# Patient Record
Sex: Female | Born: 1995 | Race: Black or African American | Hispanic: No | Marital: Single | Smoking: Former smoker
Health system: Southern US, Community
[De-identification: ages and names within clinical notes are randomized; demographics above are authoritative.]

## PROBLEM LIST (undated history)

## (undated) DIAGNOSIS — B9689 Other specified bacterial agents as the cause of diseases classified elsewhere: Secondary | ICD-10-CM

## (undated) DIAGNOSIS — N949 Unspecified condition associated with female genital organs and menstrual cycle: Secondary | ICD-10-CM

## (undated) DIAGNOSIS — R1032 Left lower quadrant pain: Secondary | ICD-10-CM

## (undated) DIAGNOSIS — Z309 Encounter for contraceptive management, unspecified: Secondary | ICD-10-CM

## (undated) DIAGNOSIS — A549 Gonococcal infection, unspecified: Secondary | ICD-10-CM

## (undated) DIAGNOSIS — N898 Other specified noninflammatory disorders of vagina: Secondary | ICD-10-CM

## (undated) DIAGNOSIS — Z9109 Other allergy status, other than to drugs and biological substances: Secondary | ICD-10-CM

## (undated) DIAGNOSIS — N921 Excessive and frequent menstruation with irregular cycle: Secondary | ICD-10-CM

## (undated) DIAGNOSIS — N760 Acute vaginitis: Principal | ICD-10-CM

## (undated) DIAGNOSIS — O43199 Other malformation of placenta, unspecified trimester: Secondary | ICD-10-CM

## (undated) HISTORY — DX: Other specified bacterial agents as the cause of diseases classified elsewhere: B96.89

## (undated) HISTORY — DX: Unspecified condition associated with female genital organs and menstrual cycle: N94.9

## (undated) HISTORY — DX: Gonococcal infection, unspecified: A54.9

## (undated) HISTORY — PX: NO PAST SURGERIES: SHX2092

## (undated) HISTORY — DX: Other malformation of placenta, unspecified trimester: O43.199

## (undated) HISTORY — DX: Left lower quadrant pain: R10.32

## (undated) HISTORY — DX: Other specified noninflammatory disorders of vagina: N89.8

## (undated) HISTORY — DX: Encounter for contraceptive management, unspecified: Z30.9

## (undated) HISTORY — DX: Acute vaginitis: N76.0

## (undated) HISTORY — DX: Excessive and frequent menstruation with irregular cycle: N92.1

---
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
## 2016-02-10 ENCOUNTER — Ambulatory Visit: Payer: Self-pay | Admitting: Obstetrics and Gynecology

## 2016-05-09 ENCOUNTER — Encounter (HOSPITAL_COMMUNITY): Payer: Self-pay | Admitting: Emergency Medicine

## 2016-05-09 ENCOUNTER — Emergency Department (HOSPITAL_COMMUNITY)
Admission: EM | Admit: 2016-05-09 | Discharge: 2016-05-09 | Disposition: A | Discharge status: Home / Self Care | Payer: Medicaid Other | Attending: Emergency Medicine | Admitting: Emergency Medicine

## 2016-05-09 DIAGNOSIS — F17210 Nicotine dependence, cigarettes, uncomplicated: Secondary | ICD-10-CM | POA: Insufficient documentation

## 2016-05-09 DIAGNOSIS — J020 Streptococcal pharyngitis: Secondary | ICD-10-CM | POA: Insufficient documentation

## 2016-05-09 DIAGNOSIS — J45909 Unspecified asthma, uncomplicated: Secondary | ICD-10-CM | POA: Insufficient documentation

## 2016-05-09 LAB — RAPID STREP SCREEN (MED CTR MEBANE ONLY): Streptococcus, Group A Screen (Direct): POSITIVE — AB

## 2016-05-09 LAB — POC URINE PREG, ED: Preg Test, Ur: NEGATIVE

## 2016-05-09 MED ORDER — AZITHROMYCIN 250 MG PO TABS
ORAL_TABLET | ORAL | 0 refills | Status: DC
Start: 2016-05-09 — End: 2020-01-14

## 2016-05-09 MED ORDER — ACETAMINOPHEN 325 MG PO TABS
650.0000 mg | ORAL_TABLET | Freq: Once | ORAL | Status: AC
  Administered 2016-05-09: 650 mg via ORAL
  Filled 2016-05-09: qty 2

## 2016-05-09 NOTE — Discharge Instructions (Signed)
Take your entire course of antibiotics. Use tylenol or motrin for sore throat pain.

## 2016-05-09 NOTE — ED Triage Notes (Signed)
Pt reports sore throat x2 days. Pt denies any known fever.

## 2016-05-09 NOTE — ED Provider Notes (Signed)
[facility]PT Provider Note   CSN: [id number] Arrival date & time: 05/09/16  1520  By signing my name below, I, Cynda Acres, attest that this documentation has been prepared under the direction and in the presence of Burgess Amor, PA-C. Electronically Signed: Cynda Acres, Scribe. 05/09/16. 4:21 PM.  History   Chief Complaint Chief Complaint  Patient presents with  . Sore Throat    HPI Comments: Christina Conley is a 21 y.o. female with a history of asthma, who presents to the Emergency Department complaining of a persistent sore throat that began 3b days ago. Patient states her friend was diagnosed with strep throat a 4 days ago, in which she was given penicillin. Patient reports associated cough with sputum, subjective fever, nasal congestion, diaphoresis, and headache. No modifying factors indicated. Patient is a tobacco user.  Patient denies cp, sob, nausea, vomiting, diarrhea, dizziness, weakness, rhinorrhea, sinus or ear pain.  The history is provided by the patient. No language interpreter was used.    Past Medical History:  Diagnosis Date  . Asthma    child.  No meds > 2 years  . BV (bacterial vaginosis) 07/02/2012  . Contraceptive management 05/27/2015  . Environmental allergies   . Gonorrhea 08/2015  . Irregular intermenstrual bleeding 05/27/2015  . LLQ pain 05/27/2015  . Round ligament pain 12/09/2013  . Vaginal discharge 12/09/2013    Patient Active Problem List   Diagnosis Date Noted  . BV (bacterial vaginosis) 09/17/2015  . Irregular intermenstrual bleeding 05/27/2015  . LLQ pain 05/27/2015  . Contraceptive management 05/27/2015  . Active labor 07/10/2014  . Supervision of normal pregnancy in first trimester 12/12/2013  . Round ligament pain 12/09/2013  . Vaginal discharge 12/09/2013    Past Surgical History:  Procedure Laterality Date  . NO PAST SURGERIES      OB History    Gravida Para Term Preterm AB Living   2 1 1   1 1    SAB TAB Ectopic Multiple  Live Births   1     0 1       Home Medications    Prior to Admission medications   Medication Sig Start Date End Date Taking? Authorizing Provider  azithromycin (ZITHROMAX Z-PAK) 250 MG tablet Take 2 tablets by mouth on day one followed by one tablet daily for 4 days. 05/09/16   Burgess Amor, PA-C    Family History Family History  Problem Relation Age of Onset  . Hypertension Maternal Grandfather   . Hyperlipidemia Maternal Grandfather   . Crohn's disease Mother   . Other Father     problems with intestines  . Crohn's disease Paternal Grandmother   . Hypertension Maternal Grandmother   . Miscarriages / Stillbirths Maternal Grandmother     Social History Social History  Substance Use Topics  . Smoking status: Current Some Day Smoker    Packs/day: 0.25    Years: 0.50    Types: Cigarettes  . Smokeless tobacco: Never Used  . Alcohol use No     Allergies   Amoxicillin; Ampicillin; and Penicillins   Review of Systems Review of Systems  Constitutional: Positive for diaphoresis and fever (subjective). Negative for chills.  HENT: Positive for congestion and sore throat. Negative for ear pain and rhinorrhea.   Gastrointestinal: Negative for nausea and vomiting.  Neurological: Positive for headaches.     Physical Exam Updated Vital Signs BP 122/81 (BP Location: Right Arm)   Pulse 81   Temp 99.4 F (37.4 C) (Oral)  Resp 18   Ht 5\' 11"  (1.803 m)   Wt 68.9 kg   LMP 04/13/2016   SpO2 100%   BMI 21.20 kg/m   Physical Exam  Constitutional: She is oriented to person, place, and time. She appears well-developed.  HENT:  Head: Normocephalic and atraumatic.  Mouth/Throat: Oropharynx is clear and moist. No oropharyngeal exudate.  Generalized posterior pharyngeal erythema. Scattered petechiae on the soft palate and uvula. No significant edema. Tenderness at the bilateral tonsillar nodes without any significant edema.   Eyes: Conjunctivae and EOM are normal. Pupils are  equal, round, and reactive to light.  Neck: Normal range of motion. Neck supple.  Cardiovascular: Normal rate, regular rhythm and normal heart sounds.   Pulmonary/Chest: Effort normal and breath sounds normal. She has no wheezes. She has no rales.  Musculoskeletal: Normal range of motion.  Neurological: She is alert and oriented to person, place, and time.  Skin: Skin is warm and dry.  Psychiatric: She has a normal mood and affect.  Nursing note and vitals reviewed.    ED Treatments / Results  DIAGNOSTIC STUDIES: Oxygen Saturation is 99% on RA, normal by my interpretation.    COORDINATION OF CARE: 4:20 PM Discussed treatment plan with pt at bedside and pt agreed to plan, which includes antibiotics.   Labs (all labs ordered are listed, but only abnormal results are displayed) Labs Reviewed  RAPID STREP SCREEN (NOT AT [facility]) - Abnormal; Notable for the following:       Result Value   Streptococcus, Group A Screen (Direct) POSITIVE (*)    All other components within normal limits  POC URINE PREG, ED    EKG  EKG Interpretation None       Radiology No results found.  Procedures Procedures (including critical care time)  Medications Ordered in ED Medications  acetaminophen (TYLENOL) tablet 650 mg (650 mg Oral Given 05/09/16 1648)     Initial Impression / Assessment and Plan / ED Course  I have reviewed the triage vital signs and the nursing notes.  Pertinent labs & imaging results that were available during my care of the patient were reviewed by me and considered in my medical decision making (see chart for details).     Strep positive. z pack given amoxil allergy. Rest, fluids, tylenol/motrin, return precautions discussed.  Final Clinical Impressions(s) / ED Diagnoses   Final diagnoses:  Strep throat    New Prescriptions Discharge Medication List as of 05/09/2016  5:17 PM     I personally performed the services described in this documentation, which was  scribed in my presence. The recorded information has been reviewed and is accurate.     Burgess AmorJulie Sabiha Sura, PA-C 05/10/16 1303    Maia PlanJoshua G Long, MD 05/10/16 [phone]

## 2016-05-20 ENCOUNTER — Emergency Department (HOSPITAL_COMMUNITY)
Admission: EM | Admit: 2016-05-20 | Discharge: 2016-05-21 | Disposition: A | Discharge status: Home / Self Care | Payer: Medicaid Other | Attending: Emergency Medicine | Admitting: Emergency Medicine

## 2016-05-20 ENCOUNTER — Encounter (HOSPITAL_COMMUNITY): Payer: Self-pay

## 2016-05-20 DIAGNOSIS — F17210 Nicotine dependence, cigarettes, uncomplicated: Secondary | ICD-10-CM | POA: Insufficient documentation

## 2016-05-20 DIAGNOSIS — J45909 Unspecified asthma, uncomplicated: Secondary | ICD-10-CM | POA: Insufficient documentation

## 2016-05-20 DIAGNOSIS — R110 Nausea: Secondary | ICD-10-CM | POA: Insufficient documentation

## 2016-05-20 DIAGNOSIS — R102 Pelvic and perineal pain: Secondary | ICD-10-CM | POA: Insufficient documentation

## 2016-05-20 NOTE — ED Triage Notes (Signed)
Patient states that she is having lower abdominal pain, cramping, and pink tinge vaginal discharge.

## 2016-05-21 LAB — URINALYSIS, ROUTINE W REFLEX MICROSCOPIC
Bacteria, UA: NONE SEEN
Bilirubin Urine: NEGATIVE
Glucose, UA: NEGATIVE mg/dL
Hgb urine dipstick: NEGATIVE
Ketones, ur: 5 mg/dL — AB
Nitrite: NEGATIVE
PH: 5 (ref 5.0–8.0)
Protein, ur: NEGATIVE mg/dL
SPECIFIC GRAVITY, URINE: 1.025 (ref 1.005–1.030)

## 2016-05-21 LAB — CBC
HCT: 35.9 % — ABNORMAL LOW (ref 36.0–46.0)
Hemoglobin: 12.2 g/dL (ref 12.0–15.0)
MCH: 29.6 pg (ref 26.0–34.0)
MCHC: 34 g/dL (ref 30.0–36.0)
MCV: 87.1 fL (ref 78.0–100.0)
PLATELETS: 309 10*3/uL (ref 150–400)
RBC: 4.12 MIL/uL (ref 3.87–5.11)
RDW: 14 % (ref 11.5–15.5)
WBC: 8.3 10*3/uL (ref 4.0–10.5)

## 2016-05-21 LAB — COMPREHENSIVE METABOLIC PANEL
ALT: 16 U/L (ref 14–54)
ANION GAP: 7 (ref 5–15)
AST: 20 U/L (ref 15–41)
Albumin: 4.2 g/dL (ref 3.5–5.0)
Alkaline Phosphatase: 51 U/L (ref 38–126)
BUN: 11 mg/dL (ref 6–20)
CALCIUM: 9.2 mg/dL (ref 8.9–10.3)
CHLORIDE: 103 mmol/L (ref 101–111)
CO2: 27 mmol/L (ref 22–32)
Creatinine, Ser: 0.78 mg/dL (ref 0.44–1.00)
Glucose, Bld: 97 mg/dL (ref 65–99)
Potassium: 3.5 mmol/L (ref 3.5–5.1)
SODIUM: 137 mmol/L (ref 135–145)
Total Bilirubin: 0.6 mg/dL (ref 0.3–1.2)
Total Protein: 7.6 g/dL (ref 6.5–8.1)

## 2016-05-21 LAB — WET PREP, GENITAL
CLUE CELLS WET PREP: NONE SEEN
SPERM: NONE SEEN
TRICH WET PREP: NONE SEEN
YEAST WET PREP: NONE SEEN

## 2016-05-21 LAB — LIPASE, BLOOD: LIPASE: 13 U/L (ref 11–51)

## 2016-05-21 LAB — POC URINE PREG, ED: Preg Test, Ur: NEGATIVE

## 2016-05-21 MED ORDER — KETOROLAC TROMETHAMINE 30 MG/ML IJ SOLN
30.0000 mg | Freq: Once | INTRAMUSCULAR | Status: DC
  Filled 2016-05-21: qty 1

## 2016-05-21 NOTE — ED Notes (Signed)
Pt not in room when discharge paperwork brought to room. Per another nurse pt left before papers were ready.

## 2016-05-21 NOTE — ED Provider Notes (Signed)
[facility] Provider Note   CSN: [id number] Arrival date & time: 05/20/16  2218  Time seen 12:55 AM   History   Chief Complaint Chief Complaint  Patient presents with  . Abdominal Pain    HPI Christina Conley is a 21 y.o. female.  HPI  patient states on March 29 about 4 PM in the afternoon she started having lower abdominal cramping all the way across her lower abdomen. She states it's a burning sensation. She states it comes and goes but got worse today. She's had nausea without vomiting or diarrhea. She denies dysuria, frequency, hematuria, or vaginal discharge. She denies fevers. She states after she urinates if she wipes she sometimes seems a faint pink on the toilet paper. She states laying on her side makes it hurt more, also relaxing makes it hurt more. Nothing makes it feel better however she has tried no medications. She states she had similar pain with cyst in the past.  Patient states her last normal period started March 22. She is G2 P1 Ab1, she is sexually active and uses condoms.  GYN Dr Emelda Fear  Past Medical History:  Diagnosis Date  . Asthma    child.  No meds > 2 years  . BV (bacterial vaginosis) 07/02/2012  . Contraceptive management 05/27/2015  . Environmental allergies   . Gonorrhea 08/2015  . Irregular intermenstrual bleeding 05/27/2015  . LLQ pain 05/27/2015  . Round ligament pain 12/09/2013  . Vaginal discharge 12/09/2013    Patient Active Problem List   Diagnosis Date Noted  . BV (bacterial vaginosis) 09/17/2015  . Irregular intermenstrual bleeding 05/27/2015  . LLQ pain 05/27/2015  . Contraceptive management 05/27/2015  . Active labor 07/10/2014  . Supervision of normal pregnancy in first trimester 12/12/2013  . Round ligament pain 12/09/2013  . Vaginal discharge 12/09/2013    Past Surgical History:  Procedure Laterality Date  . NO PAST SURGERIES      OB History    Gravida Para Term Preterm AB Living   SAB TAB Ectopic  Multiple Live Births   1     0 1       Home Medications    Prior to Admission medications   Medication Sig Start Date End Date Taking? Authorizing Provider  azithromycin (ZITHROMAX Z-PAK) 250 MG tablet Take 2 tablets by mouth on day one followed by one tablet daily for 4 days. 05/09/16   Burgess Amor, PA-C    Family History Family History  Problem Relation Age of Onset  . Hypertension Maternal Grandfather   . Hyperlipidemia Maternal Grandfather   . Crohn's disease Mother   . Other Father     problems with intestines  . Crohn's disease Paternal Grandmother   . Hypertension Maternal Grandmother   . Miscarriages / Stillbirths Maternal Grandmother     Social History Social History  Substance Use Topics  . Smoking status: Current Some Day Smoker    Packs/day: 0.25    Years: 0.50    Types: Cigarettes  . Smokeless tobacco: Never Used  . Alcohol use No  unemployed   Allergies   Amoxicillin; Ampicillin; and Penicillins   Review of Systems Review of Systems  All other systems reviewed and are negative.    Physical Exam Updated Vital Signs BP 103/78 (BP Location: Right Arm)   Pulse 87   Temp 97.8 F (36.6 C) (Oral)   Resp 20   Ht  (1.803 m)  Wt 158 lb (71.7 kg)   LMP 05/12/2016 (Exact Date)   SpO2 100%   BMI 22.04 kg/m   Vital signs normal    Physical Exam  Constitutional: She is oriented to person, place, and time. She appears well-developed and well-nourished.  Non-toxic appearance. She does not appear ill. No distress.  HENT:  Head: Normocephalic and atraumatic.  Right Ear: External ear normal.  Left Ear: External ear normal.  Nose: Nose normal. No mucosal edema or rhinorrhea.  Mouth/Throat: Oropharynx is clear and moist and mucous membranes are normal. No dental abscesses or uvula swelling.  Eyes: Conjunctivae and EOM are normal. Pupils are equal, round, and reactive to light.  Neck: Normal range of motion and full passive range of motion without  pain. Neck supple.  Cardiovascular: Normal rate, regular rhythm and normal heart sounds.  Exam reveals no gallop and no friction rub.   No murmur heard. Pulmonary/Chest: Effort normal and breath sounds normal. No respiratory distress. She has no wheezes. She has no rhonchi. She has no rales. She exhibits no tenderness and no crepitus.  Abdominal: Soft. Normal appearance and bowel sounds are normal. She exhibits no distension. There is no tenderness. There is no rebound and no guarding.  Genitourinary:  Genitourinary Comments: Normal external genitalia, uterus is normal size and nontender, she is nontender to palpation over either ovary. She is noted to have a moderate amount of thin blood-tinged vaginal discharge.  Musculoskeletal: Normal range of motion. She exhibits no edema or tenderness.  Moves all extremities well.   Neurological: She is alert and oriented to person, place, and time. She has normal strength. No cranial nerve deficit.  Skin: Skin is warm, dry and intact. No rash noted. No erythema. No pallor.  Psychiatric: She has a normal mood and affect. Her speech is normal and behavior is normal. Her mood appears not anxious.  Nursing note and vitals reviewed.    ED Treatments / Results  Labs (all labs ordered are listed, but only abnormal results are displayed) Results for orders placed or performed during the hospital encounter of 05/20/16  Wet prep, genital  Result Value Ref Range   Yeast Wet Prep HPF POC NONE SEEN NONE SEEN   Trich, Wet Prep NONE SEEN NONE SEEN   Clue Cells Wet Prep HPF POC NONE SEEN NONE SEEN   WBC, Wet Prep HPF POC MANY (A) NONE SEEN   Sperm NONE SEEN   Lipase, blood  Result Value Ref Range   Lipase 13 11 - 51 U/L  Comprehensive metabolic panel  Result Value Ref Range   Sodium 137 135 - 145 mmol/L   Potassium 3.5 3.5 - 5.1 mmol/L   Chloride 103 101 - 111 mmol/L   CO2 27 22 - 32 mmol/L   Glucose, Bld 97 65 - 99 mg/dL   BUN 11 6 - 20 mg/dL    Creatinine, Ser 2.13 0.44 - 1.00 mg/dL   Calcium 9.2 8.9 - 08.6 mg/dL   Total Protein 7.6 6.5 - 8.1 g/dL   Albumin 4.2 3.5 - 5.0 g/dL   AST 20 15 - 41 U/L   ALT 16 14 - 54 U/L   Alkaline Phosphatase 51 38 - 126 U/L   Total Bilirubin 0.6 0.3 - 1.2 mg/dL   GFR calc non Af Amer >60 >60 mL/min   GFR calc Af Amer >60 >60 mL/min   Anion gap 7 5 - 15  CBC  Result Value Ref Range   WBC 8.3 4.0 -  10.5 K/uL   RBC 4.12 3.87 - 5.11 MIL/uL   Hemoglobin 12.2 12.0 - 15.0 g/dL   HCT 16.1 (L) 09.6 - 04.5 %   MCV 87.1 78.0 - 100.0 fL   MCH 29.6 26.0 - 34.0 pg   MCHC 34.0 30.0 - 36.0 g/dL   RDW 40.9 81.1 - 91.4 %   Platelets 309 150 - 400 K/uL  Urinalysis, Routine w reflex microscopic  Result Value Ref Range   Color, Urine YELLOW YELLOW   APPearance HAZY (A) CLEAR   Specific Gravity, Urine 1.025 1.005 - 1.030   pH 5.0 5.0 - 8.0   Glucose, UA NEGATIVE NEGATIVE mg/dL   Hgb urine dipstick NEGATIVE NEGATIVE   Bilirubin Urine NEGATIVE NEGATIVE   Ketones, ur 5 (A) NEGATIVE mg/dL   Protein, ur NEGATIVE NEGATIVE mg/dL   Nitrite NEGATIVE NEGATIVE   Leukocytes, UA LARGE (A) NEGATIVE   RBC / HPF 6-30 0 - 5 RBC/hpf   WBC, UA 6-30 0 - 5 WBC/hpf   Bacteria, UA NONE SEEN NONE SEEN   Squamous Epithelial / LPF 6-30 (A) NONE SEEN   Mucous PRESENT   POC urine preg, ED  Result Value Ref Range   Preg Test, Ur NEGATIVE NEGATIVE   Laboratory interpretation all normal except contaminated UA    EKG  EKG Interpretation None       Radiology No results found.  Procedures Procedures (including critical care time)  Medications Ordered in ED Medications  ketorolac (TORADOL) 30 MG/ML injection 30 mg (30 mg Intravenous Refused 05/21/16 0113)     Initial Impression / Assessment and Plan / ED Course  I have reviewed the triage vital signs and the nursing notes.  Pertinent labs & imaging results that were available during my care of the patient were reviewed by me and considered in my medical decision  making (see chart for details).  Patient had an IV inserted by nursing staff. However she refused to get any thing for pain including IV Toradol. She states she prefers not to take any medication.  Patient reports she has a history of BV. However her wet prep tonight is normal. She had no pain to palpation when I examined her abdomen and she had no pain to palpation when I did her pelvic exam. She can take ibuprofen and/or acetaminophen for pain. She is advised [facility]. She will be called if her STD tests are positive. She should return to the ED if her symptoms seem worse.  Final Clinical Impressions(s) / ED Diagnoses   Final diagnoses:  Pelvic pain    New Prescriptions OTC ibuprofen/acetaminophen  Plan discharge  Devoria Albe, MD, Concha Pyo, MD 05/21/16 [phone]

## 2016-05-21 NOTE — ED Notes (Signed)
This nurse was at nurse's station and saw patient walk out of room one, down hallway toward's lobby. Ron, RN, charge nurse made aware.

## 2016-05-21 NOTE — Discharge Instructions (Signed)
The tests that resulted tonight do not give a reason for your pain. You can take ibuprofen 600 mg + acetaminophen 650 mg 4 times a day for pain. You will be called if your STD tests are positive. Call [facility] on Monday, April 2, if you aren't improving. Return to the ED if you get worse, such as fever, vomiting, worsening pain.

## 2016-05-23 LAB — GC/CHLAMYDIA PROBE AMP (~~LOC~~) NOT AT ARMC
CHLAMYDIA, DNA PROBE: NEGATIVE
NEISSERIA GONORRHEA: NEGATIVE

## 2016-06-27 ENCOUNTER — Encounter (HOSPITAL_COMMUNITY): Payer: Self-pay | Admitting: Emergency Medicine

## 2016-06-27 ENCOUNTER — Emergency Department (HOSPITAL_COMMUNITY)
Admission: EM | Admit: 2016-06-27 | Discharge: 2016-06-27 | Disposition: A | Discharge status: Home / Self Care | Payer: No Typology Code available for payment source | Attending: Emergency Medicine | Admitting: Emergency Medicine

## 2016-06-27 DIAGNOSIS — J45909 Unspecified asthma, uncomplicated: Secondary | ICD-10-CM | POA: Insufficient documentation

## 2016-06-27 DIAGNOSIS — M542 Cervicalgia: Secondary | ICD-10-CM | POA: Insufficient documentation

## 2016-06-27 DIAGNOSIS — Y9389 Activity, other specified: Secondary | ICD-10-CM | POA: Diagnosis not present

## 2016-06-27 DIAGNOSIS — R51 Headache: Secondary | ICD-10-CM | POA: Insufficient documentation

## 2016-06-27 DIAGNOSIS — Y92410 Unspecified street and highway as the place of occurrence of the external cause: Secondary | ICD-10-CM | POA: Insufficient documentation

## 2016-06-27 DIAGNOSIS — M7918 Myalgia, other site: Secondary | ICD-10-CM

## 2016-06-27 DIAGNOSIS — F17210 Nicotine dependence, cigarettes, uncomplicated: Secondary | ICD-10-CM | POA: Diagnosis not present

## 2016-06-27 DIAGNOSIS — Y999 Unspecified external cause status: Secondary | ICD-10-CM | POA: Diagnosis not present

## 2016-06-27 DIAGNOSIS — S0990XA Unspecified injury of head, initial encounter: Secondary | ICD-10-CM | POA: Diagnosis present

## 2016-06-27 LAB — POC URINE PREG, ED: Preg Test, Ur: NEGATIVE

## 2016-06-27 MED ORDER — ACETAMINOPHEN 500 MG PO TABS
1000.0000 mg | ORAL_TABLET | Freq: Once | ORAL | Status: AC
  Administered 2016-06-27: 1000 mg via ORAL
  Filled 2016-06-27: qty 2

## 2016-06-27 MED ORDER — IBUPROFEN 800 MG PO TABS: 800.0000 mg | ORAL_TABLET | Freq: Three times a day (TID) | ORAL | 0 refills | Status: DC
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
## 2020-01-14 ENCOUNTER — Ambulatory Visit (INDEPENDENT_AMBULATORY_CARE_PROVIDER_SITE_OTHER): Payer: Medicaid Other | Admitting: Women's Health

## 2020-01-14 ENCOUNTER — Encounter: Payer: Self-pay | Admitting: Women's Health

## 2020-01-14 ENCOUNTER — Other Ambulatory Visit: Payer: Self-pay

## 2020-01-14 VITALS — BP 112/70 | HR 72 | Ht 71.0 in | Wt 201.2 lb

## 2020-01-14 DIAGNOSIS — Z3491 Encounter for supervision of normal pregnancy, unspecified, first trimester: Secondary | ICD-10-CM

## 2020-01-14 DIAGNOSIS — Z3201 Encounter for pregnancy test, result positive: Secondary | ICD-10-CM | POA: Diagnosis not present

## 2020-01-14 LAB — POCT URINE PREGNANCY: Preg Test, Ur: POSITIVE — AB

## 2020-01-14 NOTE — Patient Instructions (Signed)
Christina Conley, I greatly value your feedback.  If you receive a survey following your visit with [place] today, we appreciate you taking the time to fill it out.  Thanks, [name], CNM, WHNP-BC   [facility] ([address] [place], [place] [address]) Entrance C, located off of [address] Free 24/7 valet parking   Nausea & Vomiting  Have saltine crackers or pretzels by your bed and eat a few bites before you raise your head out of bed in the morning  Eat small frequent meals throughout the day instead of large meals  Drink plenty of fluids throughout the day to stay hydrated, just don't drink a lot of fluids with your meals.  This can make your stomach fill up faster making you feel sick  Do not brush your teeth right after you eat  Products with real ginger are good for nausea, like ginger ale and ginger hard candy Make sure it says made with real ginger!  Sucking on sour candy like lemon heads is also good for nausea  If your prenatal vitamins make you nauseated, take them at night so you will sleep through the nausea  Sea Bands  If you feel like you need medicine for the nausea & vomiting please let us know  If you are unable to keep any fluids or food down please let us know   Constipation  Drink plenty of fluid, preferably water, throughout the day  Eat foods high in fiber such as fruits, vegetables, and grains  Exercise, such as walking, is a good way to keep your bowels regular  Drink warm fluids, especially warm prune juice, or decaf coffee  Eat a 1/2 cup of real oatmeal (not instant), 1/2 cup applesauce, and 1/2-1 cup warm prune juice every day  If needed, you may take Colace (docusate sodium) stool softener once or twice a day to help keep the stool soft.   If you still are having problems with constipation, you may take Miralax once daily as needed to help keep your bowels regular.   Home Blood Pressure Monitoring for Patients    Your provider has recommended that you check your blood pressure (BP) at least once a week at home. If you do not have a blood pressure cuff at home, one will be provided for you. Contact your provider if you have not received your monitor within 1 week.   Helpful Tips for Accurate Home Blood Pressure Checks  . Don't smoke, exercise, or drink caffeine 30 minutes before checking your BP . Use the restroom before checking your BP (a full bladder can raise your pressure) . Relax in a comfortable upright chair . Feet on the ground . Left arm resting comfortably on a flat surface at the level of your heart . Legs uncrossed . Back supported . Sit quietly and don't talk . Place the cuff on your bare arm . Adjust snuggly, so that only two fingertips can fit between your skin and the top of the cuff . Check 2 readings separated by at least one minute . Keep a log of your BP readings . For a visual, please reference this diagram: http://ccnc.care/bpdiagram  Provider Name: [facility]     Phone: [phone]  Zone 1: ALL CLEAR  Continue to monitor your symptoms:  . BP reading is less than 140 (top number) or less than 90 (bottom number)  . No right upper stomach pain . No headaches or  seeing spots . No feeling nauseated or throwing up . No swelling in face and hands  Zone 2: CAUTION Call your doctor's office for any of the following:  . BP reading is greater than 140 (top number) or greater than 90 (bottom number)  . Stomach pain under your ribs in the middle or right side . Headaches or seeing spots . Feeling nauseated or throwing up . Swelling in face and hands  Zone 3: EMERGENCY  Seek immediate medical care if you have any of the following:  . BP reading is greater than160 (top number) or greater than 110 (bottom number) . Severe headaches not improving with Tylenol . Serious difficulty catching your breath . Any worsening symptoms from Zone 2    First Trimester of  Pregnancy The first trimester of pregnancy is from week 1 until the end of week 12 (months 1 through 3). A week after a sperm fertilizes an egg, the egg will implant on the wall of the uterus. This embryo will begin to develop into a baby. Genes from you and your partner are forming the baby. The female genes determine whether the baby is a boy or a girl. At 6-8 weeks, the eyes and face are formed, and the heartbeat can be seen on ultrasound. At the end of 12 weeks, all the baby's organs are formed.  Now that you are pregnant, you will want to do everything you can to have a healthy baby. Two of the most important things are to get good prenatal care and to follow your health care provider's instructions. Prenatal care is all the medical care you receive before the baby's birth. This care will help prevent, find, and treat any problems during the pregnancy and childbirth. BODY CHANGES Your body goes through many changes during pregnancy. The changes vary from woman to woman.   You may gain or lose a couple of pounds at first.  You may feel sick to your stomach (nauseous) and throw up (vomit). If the vomiting is uncontrollable, call your health care provider.  You may tire easily.  You may develop headaches that can be relieved by medicines approved by your health care provider.  You may urinate more often. Painful urination may mean you have a bladder infection.  You may develop heartburn as a result of your pregnancy.  You may develop constipation because certain hormones are causing the muscles that push waste through your intestines to slow down.  You may develop hemorrhoids or swollen, bulging veins (varicose veins).  Your breasts may begin to grow larger and become tender. Your nipples may stick out more, and the tissue that surrounds them (areola) may become darker.  Your gums may bleed and may be sensitive to brushing and flossing.  Dark spots or blotches (chloasma, mask of pregnancy)  may develop on your face. This will likely fade after the baby is born.  Your menstrual periods will stop.  You may have a loss of appetite.  You may develop cravings for certain kinds of food.  You may have changes in your emotions from day to day, such as being excited to be pregnant or being concerned that something may go wrong with the pregnancy and baby.  You may have more vivid and strange dreams.  You may have changes in your hair. These can include thickening of your hair, rapid growth, and changes in texture. Some women also have hair loss during or after pregnancy, or hair that feels dry or thin. Your  hair will most likely return to normal after your baby is born. WHAT TO EXPECT AT YOUR PRENATAL VISITS During a routine prenatal visit:  You will be weighed to make sure you and the baby are growing normally.  Your blood pressure will be taken.  Your abdomen will be measured to track your baby's growth.  The fetal heartbeat will be listened to starting around week 10 or 12 of your pregnancy.  Test results from any previous visits will be discussed. Your health care provider may ask you:  How you are feeling.  If you are feeling the baby move.  If you have had any abnormal symptoms, such as leaking fluid, bleeding, severe headaches, or abdominal cramping.  If you have any questions. Other tests that may be performed during your first trimester include:  Blood tests to find your blood type and to check for the presence of any previous infections. They will also be used to check for low iron levels (anemia) and Rh antibodies. Later in the pregnancy, blood tests for diabetes will be done along with other tests if problems develop.  Urine tests to check for infections, diabetes, or protein in the urine.  An ultrasound to confirm the proper growth and development of the baby.  An amniocentesis to check for possible genetic problems.  Fetal screens for spina bifida and  Down syndrome.  You may need other tests to make sure you and the baby are doing well. HOME CARE INSTRUCTIONS  Medicines  Follow your health care provider's instructions regarding medicine use. Specific medicines may be either safe or unsafe to take during pregnancy.  Take your prenatal vitamins as directed.  If you develop constipation, try taking a stool softener if your health care provider approves. Diet  Eat regular, well-balanced meals. Choose a variety of foods, such as meat or vegetable-based protein, fish, milk and low-fat dairy products, vegetables, fruits, and whole grain breads and cereals. Your health care provider will help you determine the amount of weight gain that is right for you.  Avoid raw meat and uncooked cheese. These carry germs that can cause birth defects in the baby.  Eating four or five small meals rather than three large meals a day may help relieve nausea and vomiting. If you start to feel nauseous, eating a few soda crackers can be helpful. Drinking liquids between meals instead of during meals also seems to help nausea and vomiting.  If you develop constipation, eat more high-fiber foods, such as fresh vegetables or fruit and whole grains. Drink enough fluids to keep your urine clear or pale yellow. Activity and Exercise  Exercise only as directed by your health care provider. Exercising will help you:  Control your weight.  Stay in shape.  Be prepared for labor and delivery.  Experiencing pain or cramping in the lower abdomen or low back is a good sign that you should stop exercising. Check with your health care provider before continuing normal exercises.  Try to avoid standing for long periods of time. Move your legs often if you must stand in one place for a long time.  Avoid heavy lifting.  Wear low-heeled shoes, and practice good posture.  You may continue to have sex unless your health care provider directs you otherwise. Relief of Pain  or Discomfort  Wear a good support bra for breast tenderness.    Take warm sitz baths to soothe any pain or discomfort caused by hemorrhoids. Use hemorrhoid cream if your health care provider  approves.    Rest with your legs elevated if you have leg cramps or low back pain.  If you develop varicose veins in your legs, wear support hose. Elevate your feet for 15 minutes, 3-4 times a day. Limit salt in your diet. Prenatal Care  Schedule your prenatal visits by the twelfth week of pregnancy. They are usually scheduled monthly at first, then more often in the last 2 months before delivery.  Write down your questions. Take them to your prenatal visits.  Keep all your prenatal visits as directed by your health care provider. Safety  Wear your seat belt at all times when driving.  Make a list of emergency phone numbers, including numbers for family, friends, the hospital, and police and fire departments. General Tips  Ask your health care provider for a referral to a local prenatal education class. Begin classes no later than at the beginning of month 6 of your pregnancy.  Ask for help if you have counseling or nutritional needs during pregnancy. Your health care provider can offer advice or refer you to specialists for help with various needs.  Do not use hot tubs, steam rooms, or saunas.  Do not douche or use tampons or scented sanitary pads.  Do not cross your legs for long periods of time.  Avoid cat litter boxes and soil used by cats. These carry germs that can cause birth defects in the baby and possibly loss of the fetus by miscarriage or stillbirth.  Avoid all smoking, herbs, alcohol, and medicines not prescribed by your health care provider. Chemicals in these affect the formation and growth of the baby.  Schedule a dentist appointment. At home, brush your teeth with a soft toothbrush and be gentle when you floss. SEEK MEDICAL CARE IF:   You have dizziness.  You have mild  pelvic cramps, pelvic pressure, or nagging pain in the abdominal area.  You have persistent nausea, vomiting, or diarrhea.  You have a bad smelling vaginal discharge.  You have pain with urination.  You notice increased swelling in your face, hands, legs, or ankles. SEEK IMMEDIATE MEDICAL CARE IF:   You have a fever.  You are leaking fluid from your vagina.  You have spotting or bleeding from your vagina.  You have severe abdominal cramping or pain.  You have rapid weight gain or loss.  You vomit blood or material that looks like coffee grounds.  You are exposed to [place] measles and have never had them.  You are exposed to fifth disease or chickenpox.  You develop a severe headache.  You have shortness of breath.  You have any kind of trauma, such as from a fall or a car accident. Document Released: 02/01/2001 Document Revised: 06/24/2013 Document Reviewed: 12/18/2012 [facility] Patient Information 2015 [place], [place]. This information is not intended to replace advice given to you by your health care provider. Make sure you discuss any questions you have with your health care provider.

## 2020-01-14 NOTE — Progress Notes (Signed)
   GYN VISIT Patient name: Christina Conley MRN [id number]  Date of birth: [birth year] Chief Complaint:   Possible Pregnancy (3+ UPT @ home; + nausea)  History of Present Illness:   Christina Conley is a 24 y.o. G9P1011 African American female at [redacted]w[redacted]d by LMP 10/17 being seen today for +HPT x 3. Nausea, no vomiting. PNV actually help w/ nausea.     Depression screen [facility] 2/9 01/14/2020 10/15/2012 10/15/2012  Decreased Interest 3 0 0  Down, Depressed, Hopeless 0 1 1  PHQ - 2 Score 3 1 1   Altered sleeping 3 2 2   Tired, decreased energy 3 1 -  Change in appetite 3 2 -  Feeling bad or failure about yourself  1 1 -  Trouble concentrating 0 0 -  Moving slowly or fidgety/restless 0 0 -  Suicidal thoughts 0 0 -  PHQ-9 Score 13 7 3     Patient's last menstrual period was 12/08/2019. The current method of family planning is none.  Review of Systems:   Pertinent items are noted in HPI Denies fever/chills, dizziness, headaches, visual disturbances, fatigue, shortness of breath, chest pain, abdominal pain, vomiting, abnormal vaginal discharge/itching/odor/irritation, problems with periods, bowel movements, urination, or intercourse unless otherwise stated above.  Pertinent History Reviewed:  Reviewed past medical,surgical, social, obstetrical and family history.  Reviewed problem list, medications and allergies. Physical Assessment:   Vitals:   01/14/20 0952  BP: 112/70  Pulse: 72  Weight: 201 lb 3.2 oz (91.3 kg)  Height: 5\' 11"  (1.803 m)  Body mass index is 28.06 kg/m.       Physical Examination:   General appearance: alert, well appearing, and in no distress  Mental status: alert, oriented to person, place, and time  Skin: warm & dry   Cardiovascular: normal heart rate noted  Respiratory: normal respiratory effort, no distress  Abdomen: soft, non-tender   Pelvic: examination not indicated  Extremities: no edema   Chaperone: N/A    Results for orders placed or performed in visit on  01/14/20 (from the past 24 hour(s))  POCT urine pregnancy   Collection Time: 01/14/20  9:51 AM  Result Value Ref Range   Preg Test, Ur Positive (A) Negative    Assessment & Plan:  1) [redacted]w[redacted]d by LMP> will get dating u/s in 2wks  2) Nausea> declines meds today, let know if changes mind  Meds: No orders of the defined types were placed in this encounter.   Orders Placed This Encounter  Procedures  . 01/16/20 OB Comp Less 14 Wks  . POCT urine pregnancy    Return in about 2 weeks (around 01/28/2020) for dating u/s.  [redacted]w[redacted]d CNM, [facility] 01/14/2020 10:17 AM

## 2020-01-28 ENCOUNTER — Other Ambulatory Visit: Payer: Self-pay | Admitting: Women's Health

## 2020-01-28 ENCOUNTER — Other Ambulatory Visit: Payer: Self-pay

## 2020-01-28 ENCOUNTER — Ambulatory Visit (INDEPENDENT_AMBULATORY_CARE_PROVIDER_SITE_OTHER): Payer: Medicaid Other

## 2020-01-28 DIAGNOSIS — Z3491 Encounter for supervision of normal pregnancy, unspecified, first trimester: Secondary | ICD-10-CM | POA: Diagnosis not present

## 2020-01-28 DIAGNOSIS — O30031 Twin pregnancy, monochorionic/diamniotic, first trimester: Secondary | ICD-10-CM

## 2020-01-28 NOTE — Progress Notes (Signed)
[place] MO/DI twin IUP,normal ovaries,single anterior placenta,one chorion,two amnions BABY A: CRL 17.08 mm,fhr 160 bpm,YS BABY B:CRL 17.48 mm,fhr 158 bpm,YS

## 2020-01-30 ENCOUNTER — Telehealth: Payer: Self-pay | Admitting: Obstetrics & Gynecology

## 2020-01-30 NOTE — Telephone Encounter (Signed)
Called patient back. States that she has a history of migraines. She has been having headaches every day lately and dizzy when standing. She has not tried anything for the headache. I advised that she can use tylenol as directed and increase fluids to see if this helps. If she doesn't get relief with tylenol patient will call us back for further advice.

## 2020-01-30 NOTE — Telephone Encounter (Signed)
Preg with twins, NewOB appt 02/26/2020  Pt having bad headaches, consistent since her ultrasound A little dizzy when standing    Please advise     [facility]

## 2020-02-02 ENCOUNTER — Emergency Department (HOSPITAL_COMMUNITY)
Admission: EM | Admit: 2020-02-02 | Discharge: 2020-02-02 | Disposition: A | Discharge status: Home / Self Care | Payer: Medicaid Other | Attending: Emergency Medicine | Admitting: Emergency Medicine

## 2020-02-02 ENCOUNTER — Other Ambulatory Visit: Payer: Self-pay

## 2020-02-02 ENCOUNTER — Encounter (HOSPITAL_COMMUNITY): Payer: Self-pay

## 2020-02-02 DIAGNOSIS — Z3A01 Less than 8 weeks gestation of pregnancy: Secondary | ICD-10-CM | POA: Diagnosis not present

## 2020-02-02 DIAGNOSIS — Z87891 Personal history of nicotine dependence: Secondary | ICD-10-CM | POA: Diagnosis not present

## 2020-02-02 DIAGNOSIS — Z3A08 8 weeks gestation of pregnancy: Secondary | ICD-10-CM | POA: Insufficient documentation

## 2020-02-02 DIAGNOSIS — R1030 Lower abdominal pain, unspecified: Secondary | ICD-10-CM | POA: Diagnosis not present

## 2020-02-02 DIAGNOSIS — O26891 Other specified pregnancy related conditions, first trimester: Secondary | ICD-10-CM | POA: Diagnosis not present

## 2020-02-02 DIAGNOSIS — R109 Unspecified abdominal pain: Secondary | ICD-10-CM | POA: Diagnosis not present

## 2020-02-02 DIAGNOSIS — J45909 Unspecified asthma, uncomplicated: Secondary | ICD-10-CM | POA: Diagnosis not present

## 2020-02-02 DIAGNOSIS — O99891 Other specified diseases and conditions complicating pregnancy: Secondary | ICD-10-CM | POA: Diagnosis not present

## 2020-02-02 LAB — CBC WITH DIFFERENTIAL/PLATELET
Abs Immature Granulocytes: 0.02 10*3/uL (ref 0.00–0.07)
Basophils Absolute: 0 10*3/uL (ref 0.0–0.1)
Basophils Relative: 0 %
Eosinophils Absolute: 0.1 10*3/uL (ref 0.0–0.5)
Eosinophils Relative: 1 %
HCT: 37.2 % (ref 36.0–46.0)
Hemoglobin: 12.6 g/dL (ref 12.0–15.0)
Immature Granulocytes: 0 %
Lymphocytes Relative: 26 %
Lymphs Abs: 2.4 10*3/uL (ref 0.7–4.0)
MCH: 30.1 pg (ref 26.0–34.0)
MCHC: 33.9 g/dL (ref 30.0–36.0)
MCV: 89 fL (ref 80.0–100.0)
Monocytes Absolute: 0.8 10*3/uL (ref 0.1–1.0)
Monocytes Relative: 8 %
Neutro Abs: 6.1 10*3/uL (ref 1.7–7.7)
Neutrophils Relative %: 65 %
Platelets: 335 10*3/uL (ref 150–400)
RBC: 4.18 MIL/uL (ref 3.87–5.11)
RDW: 13.1 % (ref 11.5–15.5)
WBC: 9.3 10*3/uL (ref 4.0–10.5)
nRBC: 0 % (ref 0.0–0.2)

## 2020-02-02 LAB — COMPREHENSIVE METABOLIC PANEL
ALT: 13 U/L (ref 0–44)
AST: 15 U/L (ref 15–41)
Albumin: 3.6 g/dL (ref 3.5–5.0)
Alkaline Phosphatase: 53 U/L (ref 38–126)
Anion gap: 7 (ref 5–15)
BUN: 11 mg/dL (ref 6–20)
CO2: 24 mmol/L (ref 22–32)
Calcium: 9.3 mg/dL (ref 8.9–10.3)
Chloride: 102 mmol/L (ref 98–111)
Creatinine, Ser: 0.67 mg/dL (ref 0.44–1.00)
GFR, Estimated: >60 mL/min (ref >60)
Glucose, Bld: 84 mg/dL (ref 70–99)
Potassium: 3.5 mmol/L (ref 3.5–5.1)
Sodium: 133 mmol/L — ABNORMAL LOW (ref 135–145)
Total Bilirubin: 0.2 mg/dL — ABNORMAL LOW (ref 0.3–1.2)
Total Protein: 7.5 g/dL (ref 6.5–8.1)

## 2020-02-02 LAB — URINALYSIS, ROUTINE W REFLEX MICROSCOPIC
Bilirubin Urine: NEGATIVE
Glucose, UA: NEGATIVE mg/dL
Hgb urine dipstick: NEGATIVE
Ketones, ur: NEGATIVE mg/dL
Nitrite: NEGATIVE
Protein, ur: NEGATIVE mg/dL
Specific Gravity, Urine: 1.015 (ref 1.005–1.030)
pH: 7 (ref 5.0–8.0)

## 2020-02-02 LAB — LIPASE, BLOOD: Lipase: 20 U/L (ref 11–51)

## 2020-02-02 LAB — POC URINE PREG, ED: Preg Test, Ur: POSITIVE — AB

## 2020-02-02 MED ORDER — SODIUM CHLORIDE 0.9 % IV BOLUS: 1000.0000 mL | Freq: Once | INTRAVENOUS | Status: DC

## 2020-02-02 MED ORDER — ONDANSETRON HCL 4 MG/2ML IJ SOLN
4.0000 mg | Freq: Once | INTRAMUSCULAR | Status: DC
  Filled 2020-02-02: qty 2

## 2020-02-02 NOTE — ED Notes (Signed)
ED Provider at bedside. 

## 2020-02-02 NOTE — ED Provider Notes (Signed)
  Patient left at change of shift to get results of UA and CMP.  Results for orders placed or performed during the hospital encounter of 02/02/20  CBC with Differential  Result Value Ref Range   WBC 9.3 4.0 - 10.5 K/uL   RBC 4.18 3.87 - 5.11 MIL/uL   Hemoglobin 12.6 12.0 - 15.0 g/dL   HCT 50.9 32.6 - 71.2 %   MCV 89.0 80.0 - 100.0 fL   MCH 30.1 26.0 - 34.0 pg   MCHC 33.9 30.0 - 36.0 g/dL   RDW 45.8 09.9 - 83.3 %   Platelets 335 150 - 400 K/uL   nRBC 0.0 0.0 - 0.2 %   Neutrophils Relative % 65 %   Neutro Abs 6.1 1.7 - 7.7 K/uL   Lymphocytes Relative 26 %   Lymphs Abs 2.4 0.7 - 4.0 K/uL   Monocytes Relative 8 %   Monocytes Absolute 0.8 0.1 - 1.0 K/uL   Eosinophils Relative 1 %   Eosinophils Absolute 0.1 0.0 - 0.5 K/uL   Basophils Relative 0 %   Basophils Absolute 0.0 0.0 - 0.1 K/uL   Immature Granulocytes 0 %   Abs Immature Granulocytes 0.02 0.00 - 0.07 K/uL  Comprehensive metabolic panel  Result Value Ref Range   Sodium 133 (L) 135 - 145 mmol/L   Potassium 3.5 3.5 - 5.1 mmol/L   Chloride 102 98 - 111 mmol/L   CO2 24 22 - 32 mmol/L   Glucose, Bld 84 70 - 99 mg/dL   BUN 11 6 - 20 mg/dL   Creatinine, Ser 8.25 0.44 - 1.00 mg/dL   Calcium 9.3 8.9 - 05.3 mg/dL   Total Protein 7.5 6.5 - 8.1 g/dL   Albumin 3.6 3.5 - 5.0 g/dL   AST 15 15 - 41 U/L   ALT 13 0 - 44 U/L   Alkaline Phosphatase 53 38 - 126 U/L   Total Bilirubin 0.2 (L) 0.3 - 1.2 mg/dL   GFR, Estimated >97 >67 mL/min   Anion gap 7 5 - 15  Lipase, blood  Result Value Ref Range   Lipase 20 11 - 51 U/L  Urinalysis, Routine w reflex microscopic Urine, Clean Catch  Result Value Ref Range   Color, Urine YELLOW YELLOW   APPearance CLOUDY (A) CLEAR   Specific Gravity, Urine 1.015 1.005 - 1.030   pH 7.0 5.0 - 8.0   Glucose, UA NEGATIVE NEGATIVE mg/dL   Hgb urine dipstick NEGATIVE NEGATIVE   Bilirubin Urine NEGATIVE NEGATIVE   Ketones, ur NEGATIVE NEGATIVE mg/dL   Protein, ur NEGATIVE NEGATIVE mg/dL   Nitrite NEGATIVE  NEGATIVE   Leukocytes,Ua MODERATE (A) NEGATIVE   RBC / HPF 0-5 0 - 5 RBC/hpf   WBC, UA 6-10 0 - 5 WBC/hpf   Bacteria, UA RARE (A) NONE SEEN   Squamous Epithelial / LPF 21-50 0 - 5   Amorphous Crystal PRESENT   POC urine preg, ED (not at [facility])  Result Value Ref Range   Preg Test, Ur POSITIVE (A) NEGATIVE   Laboratory interpretation all normal except mild pyuria however it is a very contaminated sample.  Urine culture sent but she was not treated.       Devoria Albe, MD 02/03/20 [phone]

## 2020-02-02 NOTE — Discharge Instructions (Addendum)
Your intermittent abdominal pain may be due to round ligament pain which is common during pregnancy.  Your labs are normal today.  You may call and follow-up closely with your OB/GYN for further care.  Take Tylenol as needed.  Return to the ED if you have any concern.

## 2020-02-02 NOTE — ED Provider Notes (Signed)
[facility] EMERGENCY DEPARTMENT Provider Note   CSN: [id number] Arrival date & time: 02/02/20  2033     History Chief Complaint  Patient presents with  . Abdominal Cramping    Christina Conley is a 24 y.o. female.  The history is provided by the patient. No language interpreter was used.  Abdominal Cramping     Patient is a G3, P1 approximately [redacted] weeks pregnant presenting with complaints of abdominal discomfort.  Patient states for the past week she has had some tension headache.  She described as a mild sharp throbbing sensation to her temporal region that is been waxing waning.  She also endorsed nausea, and endorse occasional vomiting related to her pregnancy.  For the past 2 days she also endorsed some very mild lower abdominal cramping that is waxing waning.  She does not complain of any chest pain, trouble breathing, coughing, dysuria, hematuria, vaginal bleeding, vaginal discharge.  She recently found out that she has twins and would like to come today just to make sure everything is okay.  She does take Tylenol on occasion for her symptoms with some improvement.  At this time her pain is minimal.  She has not been vaccinated for COVID-19.  She denies any cold symptoms.  Past Medical History:  Diagnosis Date  . Asthma    child.  No meds > 2 years  . BV (bacterial vaginosis) 07/02/2012  . Contraceptive management 05/27/2015  . Environmental allergies   . Gonorrhea 08/2015  . Irregular intermenstrual bleeding 05/27/2015  . LLQ pain 05/27/2015  . Round ligament pain 12/09/2013  . Vaginal discharge 12/09/2013    There are no problems to display for this patient.   Past Surgical History:  Procedure Laterality Date  . NO PAST SURGERIES       OB History    Gravida  3   Para  1   Term  1   Preterm      AB  1   Living  1     SAB  1   IAB      Ectopic      Multiple  0   Live Births  1           Family History  Problem Relation Age of Onset  .  Hypertension Maternal Grandfather   . Hyperlipidemia Maternal Grandfather   . Crohn's disease Mother   . Other Father        problems with intestines  . Crohn's disease Paternal Grandmother   . Hypertension Maternal Grandmother   . Miscarriages / Stillbirths Maternal Grandmother     Social History   Tobacco Use  . Smoking status: Former Smoker    Packs/day: 0.25    Years: 0.50    Pack years: 0.12    Types: Cigarettes  . Smokeless tobacco: Never Used  Vaping Use  . Vaping Use: Never used  Substance Use Topics  . Alcohol use: No  . Drug use: No    Types: Marijuana    Comment: quit 2015    Home Medications Prior to Admission medications   Medication Sig Start Date End Date Taking? Authorizing Provider  Prenatal Vit-Fe Fumarate-FA (PRENATAL VITAMIN PO) Take by mouth.    [provider]    Allergies    Amoxicillin, Ampicillin, and Penicillins  Review of Systems   Review of Systems  All other systems reviewed and are negative.   Physical Exam Updated Vital Signs BP 121/79   Pulse 71  Temp 98.5 F (36.9 C) (Oral)   Resp 16   Ht 5\' 11"  (1.803 m)   Wt 91.3 kg   LMP 12/08/2019   SpO2 100%   BMI 28.06 kg/m   Physical Exam Vitals and nursing note reviewed.  Constitutional:      General: She is not in acute distress.    Appearance: She is well-developed and well-nourished.     Comments: Patient is well-appearing in no acute discomfort.  HENT:     Head: Normocephalic and atraumatic.  Eyes:     Extraocular Movements: Extraocular movements intact.     Conjunctiva/sclera: Conjunctivae normal.     Pupils: Pupils are equal, round, and reactive to light.  Cardiovascular:     Rate and Rhythm: Normal rate and regular rhythm.     Pulses: Normal pulses.     Heart sounds: Normal heart sounds.  Pulmonary:     Effort: Pulmonary effort is normal.     Breath sounds: Normal breath sounds. No wheezing, rhonchi or rales.  Abdominal:     Palpations: Abdomen is  soft.     Tenderness: There is no abdominal tenderness.     Comments: Abdomen is soft nontender.  Musculoskeletal:     Cervical back: Normal range of motion and neck supple. No rigidity.  Skin:    Findings: No rash.  Neurological:     Mental Status: She is alert and oriented to person, place, and time.     GCS: GCS eye subscore is 4. GCS verbal subscore is 5. GCS motor subscore is 6.     Cranial Nerves: Cranial nerves are intact.     Sensory: Sensation is intact.     Motor: Motor function is intact.  Psychiatric:        Mood and Affect: Mood and affect and mood normal.     ED Results / Procedures / Treatments   Labs (all labs ordered are listed, but only abnormal results are displayed) Labs Reviewed  COMPREHENSIVE METABOLIC PANEL - Abnormal; Notable for the following components:      Result Value   Sodium 133 (*)    Total Bilirubin 0.2 (*)    All other components within normal limits  POC URINE PREG, ED - Abnormal; Notable for the following components:   Preg Test, Ur POSITIVE (*)    All other components within normal limits  CBC WITH DIFFERENTIAL/PLATELET  LIPASE, BLOOD  URINALYSIS, ROUTINE W REFLEX MICROSCOPIC  I-STAT BETA HCG BLOOD, ED ([facility], [facility], [facility] ONLY)    EKG None  Radiology No results found.  Procedures Procedures (including critical care time)  Medications Ordered in ED Medications - No data to display  ED Course  I have reviewed the triage vital signs and the nursing notes.  Pertinent labs & imaging results that were available during my care of the patient were reviewed by me and considered in my medical decision making (see chart for details).    MDM Rules/Calculators/A&P                          BP 121/79   Pulse 71   Temp 98.5 F (36.9 C) (Oral)   Resp 16   Ht 5\' 11"  (1.803 m)   Wt 91.3 kg   LMP 12/08/2019   SpO2 100%   BMI 28.06 kg/m   Final Clinical Impression(s) / ED Diagnoses Final diagnoses:  Less than [redacted] weeks gestation of  pregnancy    Rx / DC  Orders ED Discharge Orders    None     9:29 PM Patient was not headache, occasional bouts of nausea and vomiting, as well as having occasional lower abdominal cramping.  She is currently pregnant with twins and she is found out this week.  Last menstrual period was 12/08/2019.  She is overall well-appearing, no concerning headache on exam no red flags.  Blood pressure is normal at 126/79.  Low suspicion for preeclampsia.  She denies any significant abdominal pain at this time and on exam her abdomen is soft nontender.  She does not endorse any vaginal bleeding or vaginal discharge concerning for threatened miscarriage or infection.  Will perform some basic screening labs, will give IV fluid as well as antinausea medication.  11:08 PM Pt refuse IVF or antinausea medication stating she isn't nauseous at this time.  She request for labs only.  She has had her [place] several days ago.  Her headache is not consistent with preeclamsia.  BP is 121/79.  Pt sign out to oncoming provider who will f/u on her electrolytes and UA and will determine disposition.  Pt may f/u with [facility] tomorrow as needed. Fayrene Helper, PA-C 02/02/20 2317    Eber Hong, MD 02/03/20 [phone]

## 2020-02-02 NOTE — ED Triage Notes (Signed)
Pt arrives from home c/o intermittent lower abdominal cramping. Pt reports being apprx [redacted] wks pregnant with twins and has had previous miscarriage. Pt also reports current tension headache.

## 2020-02-03 ENCOUNTER — Telehealth: Payer: Self-pay

## 2020-02-03 NOTE — Telephone Encounter (Signed)
Pt was seen in the [facility] ED on 02/02/20 for severe Headaches and is wanting to be seen befor Monday which is our next available for a provider visit is there something that can be called in until then or can she sbe overbooked this week.

## 2020-02-03 NOTE — Telephone Encounter (Signed)
Patient states she has had a headache for over a week now and is "over it".  She has tried taking Tylenol but it is not helping.  Informed she could try Magnesium Oxide 400-600mg  daily,B2, or COQ10.  Informed that since she is in her first trimester, we are limited to the mediations that can be prescribed.  Encouraged to push fluids, try a coke or Gatorade.

## 2020-02-04 LAB — URINE CULTURE: Culture: <10000 — AB

## 2020-02-22 NOTE — L&D Delivery Note (Signed)
Delivery Note Called to bedside and patient complete and pushing. Dr. Despina Hidden called to bedside in the setting of twin gestation. At 12:53 AM a viable female was delivered via Vaginal, Spontaneous (Presentation: Left Occiput Anterior).  APGAR: 9,9; weight 2645g. No nuchal cord noted at delivery. Twin B was FHR reassuring by ultrasound and in cephalic presentation. Cord blood was collected from cord A and umbilical clamp placed on cord A. At 1:02 AM a viable female was delivered via Vaginal, Spontaneous (Presentation: Left Occiput Anterior).  Right arm cord noted at delivery, as well as right compound hand. APGAR:9, 9; weight 2449g.   Placenta status: Spontaneous, Intact.  Cord: 3v x2, with the following complications: none.  Anesthesia: Epidural Episiotomy: None Lacerations: none Est. Blood Loss (mL):  Mom to postpartum.  Babies to Couplet care / Skin to Skin.  Christina Conley 08/11/2020, 1:23 AM

## 2020-02-25 ENCOUNTER — Other Ambulatory Visit: Payer: Self-pay | Admitting: Obstetrics & Gynecology

## 2020-02-25 DIAGNOSIS — Z3682 Encounter for antenatal screening for nuchal translucency: Secondary | ICD-10-CM

## 2020-02-25 DIAGNOSIS — O30009 Twin pregnancy, unspecified number of placenta and unspecified number of amniotic sacs, unspecified trimester: Secondary | ICD-10-CM

## 2020-02-26 ENCOUNTER — Other Ambulatory Visit: Payer: Self-pay

## 2020-02-26 ENCOUNTER — Ambulatory Visit: Payer: Medicaid Other | Admitting: *Deleted

## 2020-02-26 ENCOUNTER — Ambulatory Visit (INDEPENDENT_AMBULATORY_CARE_PROVIDER_SITE_OTHER): Payer: Medicaid Other

## 2020-02-26 ENCOUNTER — Encounter: Payer: Self-pay | Admitting: Advanced Practice Midwife

## 2020-02-26 ENCOUNTER — Ambulatory Visit (INDEPENDENT_AMBULATORY_CARE_PROVIDER_SITE_OTHER): Payer: Medicaid Other | Admitting: Advanced Practice Midwife

## 2020-02-26 VITALS — BP 112/78 | HR 104 | Wt 201.5 lb

## 2020-02-26 DIAGNOSIS — Z3A12 12 weeks gestation of pregnancy: Secondary | ICD-10-CM | POA: Diagnosis not present

## 2020-02-26 DIAGNOSIS — O0991 Supervision of high risk pregnancy, unspecified, first trimester: Secondary | ICD-10-CM | POA: Diagnosis not present

## 2020-02-26 DIAGNOSIS — O30009 Twin pregnancy, unspecified number of placenta and unspecified number of amniotic sacs, unspecified trimester: Secondary | ICD-10-CM

## 2020-02-26 DIAGNOSIS — O0992 Supervision of high risk pregnancy, unspecified, second trimester: Secondary | ICD-10-CM

## 2020-02-26 DIAGNOSIS — O30039 Twin pregnancy, monochorionic/diamniotic, unspecified trimester: Secondary | ICD-10-CM | POA: Insufficient documentation

## 2020-02-26 DIAGNOSIS — Z1389 Encounter for screening for other disorder: Secondary | ICD-10-CM

## 2020-02-26 DIAGNOSIS — Z331 Pregnant state, incidental: Secondary | ICD-10-CM

## 2020-02-26 DIAGNOSIS — O0990 Supervision of high risk pregnancy, unspecified, unspecified trimester: Secondary | ICD-10-CM | POA: Insufficient documentation

## 2020-02-26 DIAGNOSIS — Z3682 Encounter for antenatal screening for nuchal translucency: Secondary | ICD-10-CM | POA: Diagnosis not present

## 2020-02-26 DIAGNOSIS — Z31430 Encounter of female for testing for genetic disease carrier status for procreative management: Secondary | ICD-10-CM | POA: Diagnosis not present

## 2020-02-26 DIAGNOSIS — O30032 Twin pregnancy, monochorionic/diamniotic, second trimester: Secondary | ICD-10-CM

## 2020-02-26 DIAGNOSIS — Z3689 Encounter for other specified antenatal screening: Secondary | ICD-10-CM | POA: Diagnosis not present

## 2020-02-26 DIAGNOSIS — O30031 Twin pregnancy, monochorionic/diamniotic, first trimester: Secondary | ICD-10-CM

## 2020-02-26 DIAGNOSIS — Z3481 Encounter for supervision of other normal pregnancy, first trimester: Secondary | ICD-10-CM | POA: Diagnosis not present

## 2020-02-26 LAB — POCT URINALYSIS DIPSTICK OB
Blood, UA: NEGATIVE
Glucose, UA: NEGATIVE
Leukocytes, UA: NEGATIVE
Nitrite, UA: NEGATIVE
POC,PROTEIN,UA: NEGATIVE

## 2020-02-26 MED ORDER — ASPIRIN 81 MG PO CHEW: 162.0000 mg | CHEWABLE_TABLET | Freq: Every day | ORAL | 7 refills | Status: DC

## 2020-02-26 MED ORDER — BUTALBITAL-APAP-CAFFEINE 50-325-40 MG PO TABS: 1.0000 | ORAL_TABLET | Freq: Four times a day (QID) | ORAL | 0 refills | Status: DC | PRN

## 2020-02-26 MED ORDER — BLOOD PRESSURE MONITOR MISC: 0 refills | Status: DC

## 2020-02-26 NOTE — Patient Instructions (Signed)
Christina Conley, I greatly value your feedback.  If you receive a survey following your visit with [place] today, we appreciate you taking the time to fill it out.  Thanks, Philipp Deputy CNM   [facility] ([address] [place], [place] [address]) Entrance C, located off of [address] Free 24/7 valet parking   Nausea & Vomiting  Have saltine crackers or pretzels by your bed and eat a few bites before you raise your head out of bed in the morning  Eat small frequent meals throughout the day instead of large meals  Drink plenty of fluids throughout the day to stay hydrated, just don't drink a lot of fluids with your meals.  This can make your stomach fill up faster making you feel sick  Do not brush your teeth right after you eat  Products with real ginger are good for nausea, like ginger ale and ginger hard candy Make sure it says made with real ginger!  Sucking on sour candy like lemon heads is also good for nausea  If your prenatal vitamins make you nauseated, take them at night so you will sleep through the nausea  Sea Bands  If you feel like you need medicine for the nausea & vomiting please let us know  If you are unable to keep any fluids or food down please let us know   Constipation  Drink plenty of fluid, preferably water, throughout the day  Eat foods high in fiber such as fruits, vegetables, and grains  Exercise, such as walking, is a good way to keep your bowels regular  Drink warm fluids, especially warm prune juice, or decaf coffee  Eat a 1/2 cup of real oatmeal (not instant), 1/2 cup applesauce, and 1/2-1 cup warm prune juice every day  If needed, you may take Colace (docusate sodium) stool softener once or twice a day to help keep the stool soft.   If you still are having problems with constipation, you may take Miralax once daily as needed to help keep your bowels regular.   Home Blood Pressure Monitoring for Patients   Your provider  has recommended that you check your blood pressure (BP) at least once a week at home. If you do not have a blood pressure cuff at home, one will be provided for you. Contact your provider if you have not received your monitor within 1 week.   Helpful Tips for Accurate Home Blood Pressure Checks  . Don't smoke, exercise, or drink caffeine 30 minutes before checking your BP . Use the restroom before checking your BP (a full bladder can raise your pressure) . Relax in a comfortable upright chair . Feet on the ground . Left arm resting comfortably on a flat surface at the level of your heart . Legs uncrossed . Back supported . Sit quietly and don't talk . Place the cuff on your bare arm . Adjust snuggly, so that only two fingertips can fit between your skin and the top of the cuff . Check 2 readings separated by at least one minute . Keep a log of your BP readings . For a visual, please reference this diagram: http://ccnc.care/bpdiagram  Provider Name: [facility]     Phone: [phone]  Zone 1: ALL CLEAR  Continue to monitor your symptoms:  . BP reading is less than 140 (top number) or less than 90 (bottom number)  . No right upper stomach pain . No headaches or seeing  spots . No feeling nauseated or throwing up . No swelling in face and hands  Zone 2: CAUTION Call your doctor's office for any of the following:  . BP reading is greater than 140 (top number) or greater than 90 (bottom number)  . Stomach pain under your ribs in the middle or right side . Headaches or seeing spots . Feeling nauseated or throwing up . Swelling in face and hands  Zone 3: EMERGENCY  Seek immediate medical care if you have any of the following:  . BP reading is greater than160 (top number) or greater than 110 (bottom number) . Severe headaches not improving with Tylenol . Serious difficulty catching your breath . Any worsening symptoms from Zone 2    First Trimester of Pregnancy The first  trimester of pregnancy is from week 1 until the end of week 12 (months 1 through 3). A week after a sperm fertilizes an egg, the egg will implant on the wall of the uterus. This embryo will begin to develop into a baby. Genes from you and your partner are forming the baby. The female genes determine whether the baby is a boy or a girl. At 6-8 weeks, the eyes and face are formed, and the heartbeat can be seen on ultrasound. At the end of 12 weeks, all the baby's organs are formed.  Now that you are pregnant, you will want to do everything you can to have a healthy baby. Two of the most important things are to get good prenatal care and to follow your health care provider's instructions. Prenatal care is all the medical care you receive before the baby's birth. This care will help prevent, find, and treat any problems during the pregnancy and childbirth. BODY CHANGES Your body goes through many changes during pregnancy. The changes vary from woman to woman.   You may gain or lose a couple of pounds at first.  You may feel sick to your stomach (nauseous) and throw up (vomit). If the vomiting is uncontrollable, call your health care provider.  You may tire easily.  You may develop headaches that can be relieved by medicines approved by your health care provider.  You may urinate more often. Painful urination may mean you have a bladder infection.  You may develop heartburn as a result of your pregnancy.  You may develop constipation because certain hormones are causing the muscles that push waste through your intestines to slow down.  You may develop hemorrhoids or swollen, bulging veins (varicose veins).  Your breasts may begin to grow larger and become tender. Your nipples may stick out more, and the tissue that surrounds them (areola) may become darker.  Your gums may bleed and may be sensitive to brushing and flossing.  Dark spots or blotches (chloasma, mask of pregnancy) may develop on your  face. This will likely fade after the baby is born.  Your menstrual periods will stop.  You may have a loss of appetite.  You may develop cravings for certain kinds of food.  You may have changes in your emotions from day to day, such as being excited to be pregnant or being concerned that something may go wrong with the pregnancy and baby.  You may have more vivid and strange dreams.  You may have changes in your hair. These can include thickening of your hair, rapid growth, and changes in texture. Some women also have hair loss during or after pregnancy, or hair that feels dry or thin. Your hair  will most likely return to normal after your baby is born. WHAT TO EXPECT AT YOUR PRENATAL VISITS During a routine prenatal visit:  You will be weighed to make sure you and the baby are growing normally.  Your blood pressure will be taken.  Your abdomen will be measured to track your baby's growth.  The fetal heartbeat will be listened to starting around week 10 or 12 of your pregnancy.  Test results from any previous visits will be discussed. Your health care provider may ask you:  How you are feeling.  If you are feeling the baby move.  If you have had any abnormal symptoms, such as leaking fluid, bleeding, severe headaches, or abdominal cramping.  If you have any questions. Other tests that may be performed during your first trimester include:  Blood tests to find your blood type and to check for the presence of any previous infections. They will also be used to check for low iron levels (anemia) and Rh antibodies. Later in the pregnancy, blood tests for diabetes will be done along with other tests if problems develop.  Urine tests to check for infections, diabetes, or protein in the urine.  An ultrasound to confirm the proper growth and development of the baby.  An amniocentesis to check for possible genetic problems.  Fetal screens for spina bifida and Down syndrome.  You  may need other tests to make sure you and the baby are doing well. HOME CARE INSTRUCTIONS  Medicines  Follow your health care provider's instructions regarding medicine use. Specific medicines may be either safe or unsafe to take during pregnancy.  Take your prenatal vitamins as directed.  If you develop constipation, try taking a stool softener if your health care provider approves. Diet  Eat regular, well-balanced meals. Choose a variety of foods, such as meat or vegetable-based protein, fish, milk and low-fat dairy products, vegetables, fruits, and whole grain breads and cereals. Your health care provider will help you determine the amount of weight gain that is right for you.  Avoid raw meat and uncooked cheese. These carry germs that can cause birth defects in the baby.  Eating four or five small meals rather than three large meals a day may help relieve nausea and vomiting. If you start to feel nauseous, eating a few soda crackers can be helpful. Drinking liquids between meals instead of during meals also seems to help nausea and vomiting.  If you develop constipation, eat more high-fiber foods, such as fresh vegetables or fruit and whole grains. Drink enough fluids to keep your urine clear or pale yellow. Activity and Exercise  Exercise only as directed by your health care provider. Exercising will help you:  Control your weight.  Stay in shape.  Be prepared for labor and delivery.  Experiencing pain or cramping in the lower abdomen or low back is a good sign that you should stop exercising. Check with your health care provider before continuing normal exercises.  Try to avoid standing for long periods of time. Move your legs often if you must stand in one place for a long time.  Avoid heavy lifting.  Wear low-heeled shoes, and practice good posture.  You may continue to have sex unless your health care provider directs you otherwise. Relief of Pain or Discomfort  Wear a  good support bra for breast tenderness.    Take warm sitz baths to soothe any pain or discomfort caused by hemorrhoids. Use hemorrhoid cream if your health care provider approves.  Rest with your legs elevated if you have leg cramps or low back pain.  If you develop varicose veins in your legs, wear support hose. Elevate your feet for 15 minutes, 3-4 times a day. Limit salt in your diet. Prenatal Care  Schedule your prenatal visits by the twelfth week of pregnancy. They are usually scheduled monthly at first, then more often in the last 2 months before delivery.  Write down your questions. Take them to your prenatal visits.  Keep all your prenatal visits as directed by your health care provider. Safety  Wear your seat belt at all times when driving.  Make a list of emergency phone numbers, including numbers for family, friends, the hospital, and police and fire departments. General Tips  Ask your health care provider for a referral to a local prenatal education class. Begin classes no later than at the beginning of month 6 of your pregnancy.  Ask for help if you have counseling or nutritional needs during pregnancy. Your health care provider can offer advice or refer you to specialists for help with various needs.  Do not use hot tubs, steam rooms, or saunas.  Do not douche or use tampons or scented sanitary pads.  Do not cross your legs for long periods of time.  Avoid cat litter boxes and soil used by cats. These carry germs that can cause birth defects in the baby and possibly loss of the fetus by miscarriage or stillbirth.  Avoid all smoking, herbs, alcohol, and medicines not prescribed by your health care provider. Chemicals in these affect the formation and growth of the baby.  Schedule a dentist appointment. At home, brush your teeth with a soft toothbrush and be gentle when you floss. SEEK MEDICAL CARE IF:   You have dizziness.  You have mild pelvic cramps, pelvic  pressure, or nagging pain in the abdominal area.  You have persistent nausea, vomiting, or diarrhea.  You have a bad smelling vaginal discharge.  You have pain with urination.  You notice increased swelling in your face, hands, legs, or ankles. SEEK IMMEDIATE MEDICAL CARE IF:   You have a fever.  You are leaking fluid from your vagina.  You have spotting or bleeding from your vagina.  You have severe abdominal cramping or pain.  You have rapid weight gain or loss.  You vomit blood or material that looks like coffee grounds.  You are exposed to [place] measles and have never had them.  You are exposed to fifth disease or chickenpox.  You develop a severe headache.  You have shortness of breath.  You have any kind of trauma, such as from a fall or a car accident. Document Released: 02/01/2001 Document Revised: 06/24/2013 Document Reviewed: 12/18/2012 [facility] Patient Information 2015 [place], [place]. This information is not intended to replace advice given to you by your health care provider. Make sure you discuss any questions you have with your health care provider.

## 2020-02-26 NOTE — Progress Notes (Addendum)
[place] MO/DI TWINS:anterior placenta,normal ovaries,subchorionic hemorrhage 3.7 x .9 x 4.8 cm BABY A inferior,measurements c/w dates,crl 59.39 mm,fhr 155 bpm,NB present,NT 1.5 mm BABY B superior,measurements c/w dates,crl 63.13 mm,fhr 157 bpm,NB present,NT 1.5 mm

## 2020-02-26 NOTE — Progress Notes (Signed)
INITIAL OBSTETRICAL VISIT Patient name: Christina Conley MRN [id number]  Date of birth: 08/05/1995 Chief Complaint:   Initial Prenatal Visit (Nt/it, headaches, leg cramps, back pain)  History of Present Illness:   Christina Conley is a 25 y.o. G79P1011 African American female at [redacted]w[redacted]d by [place] at 8.1 weeks with an Estimated Date of Delivery: 09/07/20 being seen today for her initial obstetrical visit.   Her obstetrical history is significant for SVB x 1.   Today she reports reg headaches relieved by Tylenol for only an hour; pelvic/thigh/low back cramping that began last night.  Depression screen [facility] 2/9 02/26/2020 01/14/2020 10/15/2012 10/15/2012  Decreased Interest 3 3 0 0  Down, Depressed, Hopeless 1 0 1 1  PHQ - 2 Score 4 3 1 1   Altered sleeping 3 3 2 2   Tired, decreased energy 3 3 1  -  Change in appetite 2 3 2  -  Feeling bad or failure about yourself  0 1 1 -  Trouble concentrating 0 0 0 -  Moving slowly or fidgety/restless 0 0 0 -  Suicidal thoughts 0 0 0 -  PHQ-9 Score 12 13 7 3     Patient's last menstrual period was 12/08/2019. Last pap July 2021. Results were: normal Review of Systems:   Pertinent items are noted in HPI Denies cramping/contractions, leakage of fluid, vaginal bleeding, abnormal vaginal discharge w/ itching/odor/irritation, headaches, visual changes, shortness of breath, chest pain, abdominal pain, severe nausea/vomiting, or problems with urination or bowel movements unless otherwise stated above.  Pertinent History Reviewed:  Reviewed past medical,surgical, social, obstetrical and family history.  Reviewed problem list, medications and allergies. OB History  Gravida Para Term Preterm AB Living  3 1 1   1 1   SAB IAB Ectopic Multiple Live Births  1     0 1    # Outcome Date GA Lbr Len/2nd Weight Sex Delivery Anes PTL Lv  3 Current           2 Term 07/10/14 110w4d 10:07 / 00:18 7 lb 11.6 oz (3.505 kg) F Vag-Spont EPI  LIV  1 SAB            Physical Assessment:    Vitals:   02/26/20 1505  BP: 112/78  Pulse: (!) 104  Weight: 201 lb 8 oz (91.4 kg)  Body mass index is 28.1 kg/m.       Physical Examination:  General appearance - well appearing, and in no distress  Mental status - alert, oriented to person, place, and time  Psych:  She has a normal mood and affect  Skin - warm and dry, normal color, no suspicious lesions noted  Chest - effort normal, all lung fields clear to auscultation bilaterally  Heart - normal rate and regular rhythm  Abdomen - soft, nontender  Extremities:  No swelling or varicosities noted  Pelvic - not indicated  Thin prep pap is not done    TODAY'S NT August 2021 MO/DI TWINS:anterior placenta,normal ovaries,subchorionic hemorrhage 3.7 x .9 x 4.8 cm BABY A inferior,measurements c/w dates,crl 59.39 mm,fhr 155 bpm,NB present,NT 1.5 mm BABY B superior,measurements c/w dates,crl 63.13 mm,fhr 157 bpm,NB present,NT 1.5 mm  Results for orders placed or performed in visit on 02/26/20 (from the past 24 hour(s))  POC Urinalysis Dipstick OB   Collection Time: 02/26/20  3:19 PM  Result Value Ref Range   Color, UA     Clarity, UA     Glucose, UA Negative Negative   Bilirubin, UA  Ketones, UA moderate    Spec Grav, UA     Blood, UA neg    pH, UA     POC,PROTEIN,UA Negative Negative, Trace, Small (1+), Moderate (2+), Large (3+), 4+   Urobilinogen, UA     Nitrite, UA neg    Leukocytes, UA Negative Negative   Appearance     Odor      Assessment & Plan:  1) High-Risk Pregnancy G3P1011 at [redacted]w[redacted]d with Mo/Di TWINS with an Estimated Date of Delivery: 09/07/20- bASA 162mg  qd  2) Initial OB visit  3) Frequent headaches, rx Fioricent #20, if no relief can refer to at [facility]  Meds:  Meds ordered this encounter  Medications  . Blood Pressure Monitor MISC    Sig: For regular home bp monitoring during pregnancy    Dispense:  1 each    Refill:  0    O09.90  . butalbital-acetaminophen-caffeine (FIORICET)  50-325-40 MG tablet    Sig: Take 1-2 tablets by mouth every 6 (six) hours as needed for headache.    Dispense:  20 tablet    Refill:  0    Order Specific Question:   Supervising Provider    Answer:   [employer], LUTHER H [[id number]]  . aspirin 81 MG chewable tablet    Sig: Chew 2 tablets (162 mg total) by mouth daily.    Dispense:  60 tablet    Refill:  7    Order Specific Question:   Supervising Provider    Answer:   Despina Hidden H [[id number]]    Initial labs obtained Continue prenatal vitamins Reviewed n/v relief measures and warning s/s to report Reviewed recommended weight gain based on pre-gravid BMI Encouraged well-balanced diet Genetic & carrier screening discussed: requests Panorama and NT/IT, requests Horizon 14  Ultrasound discussed; fetal survey: requested CCNC completed> form faxed if has or is planning to apply for medicaid The nature of [facility] with multiple MDs and other Advanced Practice Providers was explained to patient; also emphasized that fellows, residents, and students are part of our team. Given order for home bp cuff. Check bp weekly, let [employer] know if >140/90.   Indications for ASA therapy (per uptodate) One of the following: Multifetal gestation Yes  No indications for early A1C (per uptodate)  Follow-up: Return in about 4 weeks (around 03/25/2020) for HROB, in person, 2nd IT.   Orders Placed This Encounter  Procedures  . Urine Culture  . GC/Chlamydia Probe Amp  . Genetic Screening  . Integrated 1  . Pain Management Screening Profile (10S)  . CBC/D/Plt+RPR+Rh+ABO+Rub Ab...  . POC Urinalysis Dipstick OB    05/23/2020 [facility] 02/26/2020 4:50 PM

## 2020-02-27 LAB — PMP SCREEN PROFILE (10S), URINE
Amphetamine Scrn, Ur: NEGATIVE ng/mL
BARBITURATE SCREEN URINE: NEGATIVE ng/mL
BENZODIAZEPINE SCREEN, URINE: NEGATIVE ng/mL
CANNABINOIDS UR QL SCN: NEGATIVE ng/mL
Cocaine (Metab) Scrn, Ur: NEGATIVE ng/mL
Creatinine(Crt), U: 52.5 mg/dL (ref 20.0–300.0)
Methadone Screen, Urine: NEGATIVE ng/mL
OXYCODONE+OXYMORPHONE UR QL SCN: NEGATIVE ng/mL
Opiate Scrn, Ur: NEGATIVE ng/mL
Ph of Urine: 5.8 (ref 4.5–8.9)
Phencyclidine Qn, Ur: NEGATIVE ng/mL
Propoxyphene Scrn, Ur: NEGATIVE ng/mL

## 2020-02-27 LAB — GC/CHLAMYDIA PROBE AMP
Chlamydia trachomatis, NAA: NEGATIVE
Neisseria Gonorrhoeae by PCR: NEGATIVE

## 2020-02-28 LAB — URINE CULTURE

## 2020-03-09 DIAGNOSIS — Z3A12 12 weeks gestation of pregnancy: Secondary | ICD-10-CM | POA: Diagnosis not present

## 2020-03-09 DIAGNOSIS — O0991 Supervision of high risk pregnancy, unspecified, first trimester: Secondary | ICD-10-CM | POA: Diagnosis not present

## 2020-03-25 ENCOUNTER — Encounter: Payer: Medicaid Other | Admitting: Advanced Practice Midwife

## 2020-03-31 ENCOUNTER — Other Ambulatory Visit: Payer: Self-pay

## 2020-03-31 ENCOUNTER — Encounter: Payer: Self-pay | Admitting: Women's Health

## 2020-03-31 ENCOUNTER — Ambulatory Visit (INDEPENDENT_AMBULATORY_CARE_PROVIDER_SITE_OTHER): Payer: Medicaid Other | Admitting: Women's Health

## 2020-03-31 VITALS — BP 109/69 | HR 93 | Wt 205.0 lb

## 2020-03-31 DIAGNOSIS — O30032 Twin pregnancy, monochorionic/diamniotic, second trimester: Secondary | ICD-10-CM

## 2020-03-31 DIAGNOSIS — Z1379 Encounter for other screening for genetic and chromosomal anomalies: Secondary | ICD-10-CM

## 2020-03-31 DIAGNOSIS — O0992 Supervision of high risk pregnancy, unspecified, second trimester: Secondary | ICD-10-CM

## 2020-03-31 NOTE — Progress Notes (Signed)
   HIGH-RISK PREGNANCY VISIT Patient name: Christina Conley MRN [id number]  Date of birth: 06-May-1995 Chief Complaint:   Routine Prenatal Visit  History of Present Illness:   Christina Conley is a 25 y.o. G20P1011 female at [redacted]w[redacted]d with an Estimated Date of Delivery: 09/07/20 being seen today for ongoing management of a high-risk pregnancy complicated by multiple gestation mono/di twins.  Today she reports n/v improving, headaches improving.  Depression screen [facility] 2/9 02/26/2020 01/14/2020 10/15/2012 10/15/2012  Decreased Interest 3 3 0 0  Down, Depressed, Hopeless 1 0 1 1  PHQ - 2 Score 4 3 1 1   Altered sleeping 3 3 2 2   Tired, decreased energy 3 3 1  -  Change in appetite 2 3 2  -  Feeling bad or failure about yourself  0 1 1 -  Trouble concentrating 0 0 0 -  Moving slowly or fidgety/restless 0 0 0 -  Suicidal thoughts 0 0 0 -  PHQ-9 Score 12 13 7 3     Contractions: Not present. Vag. Bleeding: None.  Movement: Present. denies leaking of fluid.  Review of Systems:   Pertinent items are noted in HPI Denies abnormal vaginal discharge w/ itching/odor/irritation, headaches, visual changes, shortness of breath, chest pain, abdominal pain, severe nausea/vomiting, or problems with urination or bowel movements unless otherwise stated above. Pertinent History Reviewed:  Reviewed past medical,surgical, social, obstetrical and family history.  Reviewed problem list, medications and allergies. Physical Assessment:   Vitals:   03/31/20 0921  BP: 109/69  Pulse: 93  Weight: 205 lb (93 kg)  Body mass index is 28.59 kg/m.           Physical Examination:   General appearance: alert, well appearing, and in no distress  Mental status: alert, oriented to person, place, and time  Skin: warm & dry   Extremities:      Cardiovascular: normal heart rate noted  Respiratory: normal respiratory effort, no distress  Abdomen: gravid, soft, non-tender  Pelvic: Cervical exam deferred         Fetal Status: Fetal  Heart Rate (bpm): 146/136   Movement: Present    Fetal Surveillance Testing today: informal TA u/s, +FCA and active fetus x 2, doppler FHR   Chaperone: N/A    No results found for this or any previous visit (from the past 24 hour(s)).  Assessment & Plan:  High-risk pregnancy: G3P1011 at [redacted]w[redacted]d with an Estimated Date of Delivery: 09/07/20   1) Mono/Di Twins, stable, on ASA  Meds: No orders of the defined types were placed in this encounter.  Labs/procedures today: 2nd IT  Treatment Plan:  U/S q 2wks from 14w>delivery       Weekly BPP @ 28wks, then  2x/wk testing @ 32wks or weekly BPP         Deliver @ 37wks:____   Reviewed: Preterm labor symptoms and general obstetric precautions including but not limited to vaginal bleeding, contractions, leaking of fluid and fetal movement were reviewed in detail with the patient.  All questions were answered. Has home bp cuff.  Check bp weekly, let know if >140/90.   Follow-up: Return in about 2 weeks (around 04/14/2020) for HROB, Twins, 05/29/20, [facility] only.   No future appointments.  Orders Placed This Encounter  Procedures  . [redacted]w[redacted]d OB Comp + 14 Wk  . 09/09/20 OB Comp AddL Gest + 14 Wk  . INTEGRATED 2   [place] CNM, [facility] 03/31/2020 9:59 AM

## 2020-03-31 NOTE — Patient Instructions (Signed)
Christina Conley, I greatly value your feedback.  If you receive a survey following your visit with [place] today, we appreciate you taking the time to fill it out.  Thanks, Christina Conley, CNM, WHNP-BC  [facility] ([address] [place], [place] [address]) Entrance C, located off of [address] valet parking  Go to [email or web address] to register for FREE online childbirth classes  [place] Pediatricians/Family Doctors:  [facility] [phone]            [facility] [phone]                 [facility] [phone] (usually not accepting new patients unless you have family there already, you are always welcome to call and ask)       [facility] Department [phone]       [facility] Pediatricians/Family Doctors:   [facility]: [phone]  [facility]: [phone]  [facility]: [phone]  [facility] Doctors:   [facility]: [phone]   [facility]: [phone]  [facility] Doctors:  Christina Conley: [phone]    Home Blood Pressure Monitoring for Patients   Your provider has recommended that you check your blood pressure (BP) at least once a week at home. If you do not have a blood pressure cuff at home, one will be provided for you. Contact your provider if you have not received your monitor within 1 week.   Helpful Tips for Accurate Home Blood Pressure Checks  . Don't smoke, exercise, or drink caffeine 30 minutes before checking your BP . Use the restroom before checking your BP (a full bladder can raise your pressure) . Relax in a comfortable upright chair . Feet on the ground . Left arm resting comfortably on a flat surface at the level of your heart . Legs uncrossed . Back supported . Sit quietly and don't talk . Place the cuff on your bare arm . Adjust snuggly, so  that only two fingertips can fit between your skin and the top of the cuff . Check 2 readings separated by at least one minute . Keep a log of your BP readings . For a visual, please reference this diagram: http://ccnc.care/bpdiagram  Provider Name: [facility]     Phone: [phone]  Zone 1: ALL CLEAR  Continue to monitor your symptoms:  . BP reading is less than 140 (top number) or less than 90 (bottom number)  . No right upper stomach pain . No headaches or seeing spots . No feeling nauseated or throwing up . No swelling in face and hands  Zone 2: CAUTION Call your doctor's office for any of the following:  . BP reading is greater than 140 (top number) or greater than 90 (bottom number)  . Stomach pain under your ribs in the middle or right side . Headaches or seeing spots . Feeling nauseated or throwing up . Swelling in face and hands  Zone 3: EMERGENCY  Seek immediate medical care if you have any of the following:  . BP reading is greater than160 (top number) or greater than 110 (bottom number) . Severe headaches not improving with Tylenol . Serious difficulty catching your breath . Any worsening symptoms from Zone 2     Second Trimester of Pregnancy The second trimester is from week 14 through week 27 (months 4 through 6). The second trimester is often a time when you feel your  best. Your body has adjusted to being pregnant, and you begin to feel better physically. Usually, morning sickness has lessened or quit completely, you may have more energy, and you may have an increase in appetite. The second trimester is also a time when the fetus is growing rapidly. At the end of the sixth month, the fetus is about 9 inches long and weighs about 1 pounds. You will likely begin to feel the baby move (quickening) between 16 and 20 weeks of pregnancy. Body changes during your second trimester Your body continues to go through many changes during your second trimester. The  changes vary from woman to woman.  Your weight will continue to increase. You will notice your lower abdomen bulging out.  You may begin to get stretch marks on your hips, abdomen, and breasts.  You may develop headaches that can be relieved by medicines. The medicines should be approved by your health care provider.  You may urinate more often because the fetus is pressing on your bladder.  You may develop or continue to have heartburn as a result of your pregnancy.  You may develop constipation because certain hormones are causing the muscles that push waste through your intestines to slow down.  You may develop hemorrhoids or swollen, bulging veins (varicose veins).  You may have back pain. This is caused by: ? Weight gain. ? Pregnancy hormones that are relaxing the joints in your pelvis. ? A shift in weight and the muscles that support your balance.  Your breasts will continue to grow and they will continue to become tender.  Your gums may bleed and may be sensitive to brushing and flossing.  Dark spots or blotches (chloasma, mask of pregnancy) may develop on your face. This will likely fade after the baby is born.  A dark line from your belly button to the pubic area (linea nigra) may appear. This will likely fade after the baby is born.  You may have changes in your hair. These can include thickening of your hair, rapid growth, and changes in texture. Some women also have hair loss during or after pregnancy, or hair that feels dry or thin. Your hair will most likely return to normal after your baby is born.  What to expect at prenatal visits During a routine prenatal visit:  You will be weighed to make sure you and the fetus are growing normally.  Your blood pressure will be taken.  Your abdomen will be measured to track your baby's growth.  The fetal heartbeat will be listened to.  Any test results from the previous visit will be discussed.  Your health care  provider may ask you:  How you are feeling.  If you are feeling the baby move.  If you have had any abnormal symptoms, such as leaking fluid, bleeding, severe headaches, or abdominal cramping.  If you are using any tobacco products, including cigarettes, chewing tobacco, and electronic cigarettes.  If you have any questions.  Other tests that may be performed during your second trimester include:  Blood tests that check for: ? Low iron levels (anemia). ? High blood sugar that affects pregnant women (gestational diabetes) between 71 and 28 weeks. ? Rh antibodies. This is to check for a protein on red blood cells (Rh factor).  Urine tests to check for infections, diabetes, or protein in the urine.  An ultrasound to confirm the proper growth and development of the baby.  An amniocentesis to check for possible genetic problems.  Fetal  screens for spina bifida and Down syndrome.  HIV (human immunodeficiency virus) testing. Routine prenatal testing includes screening for HIV, unless you choose not to have this test.  Follow these instructions at home: Medicines  Follow your health care provider's instructions regarding medicine use. Specific medicines may be either safe or unsafe to take during pregnancy.  Take a prenatal vitamin that contains at least 600 micrograms (mcg) of folic acid.  If you develop constipation, try taking a stool softener if your health care provider approves. Eating and drinking  Eat a balanced diet that includes fresh fruits and vegetables, whole grains, good sources of protein such as meat, eggs, or tofu, and low-fat dairy. Your health care provider will help you determine the amount of weight gain that is right for you.  Avoid raw meat and uncooked cheese. These carry germs that can cause birth defects in the baby.  If you have low calcium intake from food, talk to your health care provider about whether you should take a daily calcium  supplement.  Limit foods that are high in fat and processed sugars, such as fried and sweet foods.  To prevent constipation: ? Drink enough fluid to keep your urine clear or pale yellow. ? Eat foods that are high in fiber, such as fresh fruits and vegetables, whole grains, and beans. Activity  Exercise only as directed by your health care provider. Most women can continue their usual exercise routine during pregnancy. Try to exercise for 30 minutes at least 5 days a week. Stop exercising if you experience uterine contractions.  Avoid heavy lifting, wear low heel shoes, and practice good posture.  A sexual relationship may be continued unless your health care provider directs you otherwise. Relieving pain and discomfort  Wear a good support bra to prevent discomfort from breast tenderness.  Take warm sitz baths to soothe any pain or discomfort caused by hemorrhoids. Use hemorrhoid cream if your health care provider approves.  Rest with your legs elevated if you have leg cramps or low back pain.  If you develop varicose veins, wear support hose. Elevate your feet for 15 minutes, 3-4 times a day. Limit salt in your diet. Prenatal Care  Write down your questions. Take them to your prenatal visits.  Keep all your prenatal visits as told by your health care provider. This is important. Safety  Wear your seat belt at all times when driving.  Make a list of emergency phone numbers, including numbers for family, friends, the hospital, and police and fire departments. General instructions  Ask your health care provider for a referral to a local prenatal education class. Begin classes no later than the beginning of month 6 of your pregnancy.  Ask for help if you have counseling or nutritional needs during pregnancy. Your health care provider can offer advice or refer you to specialists for help with various needs.  Do not use hot tubs, steam rooms, or saunas.  Do not douche or use  tampons or scented sanitary pads.  Do not cross your legs for long periods of time.  Avoid cat litter boxes and soil used by cats. These carry germs that can cause birth defects in the baby and possibly loss of the fetus by miscarriage or stillbirth.  Avoid all smoking, herbs, alcohol, and unprescribed drugs. Chemicals in these products can affect the formation and growth of the baby.  Do not use any products that contain nicotine or tobacco, such as cigarettes and e-cigarettes. If you need help  quitting, ask your health care provider.  Visit your dentist if you have not gone yet during your pregnancy. Use a soft toothbrush to brush your teeth and be gentle when you floss. Contact a health care provider if:  You have dizziness.  You have mild pelvic cramps, pelvic pressure, or nagging pain in the abdominal area.  You have persistent nausea, vomiting, or diarrhea.  You have a bad smelling vaginal discharge.  You have pain when you urinate. Get help right away if:  You have a fever.  You are leaking fluid from your vagina.  You have spotting or bleeding from your vagina.  You have severe abdominal cramping or pain.  You have rapid weight gain or weight loss.  You have shortness of breath with chest pain.  You notice sudden or extreme swelling of your face, hands, ankles, feet, or legs.  You have not felt your baby move in over an hour.  You have severe headaches that do not go away when you take medicine.  You have vision changes. Summary  The second trimester is from week 14 through week 27 (months 4 through 6). It is also a time when the fetus is growing rapidly.  Your body goes through many changes during pregnancy. The changes vary from woman to woman.  Avoid all smoking, herbs, alcohol, and unprescribed drugs. These chemicals affect the formation and growth your baby.  Do not use any tobacco products, such as cigarettes, chewing tobacco, and e-cigarettes. If you  need help quitting, ask your health care provider.  Contact your health care provider if you have any questions. Keep all prenatal visits as told by your health care provider. This is important. This information is not intended to replace advice given to you by your health care provider. Make sure you discuss any questions you have with your health care provider. Document Released: 02/01/2001 Document Revised: 07/16/2015 Document Reviewed: 04/10/2012 Elsevier Interactive Patient Education  2017 [employer].   

## 2020-04-02 LAB — INTEGRATED 2
Alpha-Fetoprotein: 118.4 ng/mL
Crown Rump Length Twin B: 63.1 mm
Crown Rump Length: 59.4 mm
DIA Value: 247.8 pg/mL
Estriol, Unconjugated: 2.19 ng/mL
NT Twin B: 1.5 mm
Nuchal Translucency (NT): 1.5 mm
Number of Fetuses: 2
PAPP-A Value: 2162.2 ng/mL
Weight: 202 [lb_av]
Weight: 202 [lb_av]
hCG Value: 71 IU/mL

## 2020-04-06 LAB — CBC/D/PLT+RPR+RH+ABO+RUB AB...
Antibody Screen: NEGATIVE
Basophils Absolute: 0 10*3/uL (ref 0.0–0.2)
Basos: 0 %
EOS (ABSOLUTE): 0 10*3/uL (ref 0.0–0.4)
Eos: 0 %
HCV Ab: <0.1 s/co ratio (ref 0.0–0.9)
HIV Screen 4th Generation wRfx: NONREACTIVE
Hematocrit: 37.5 % (ref 34.0–46.6)
Hemoglobin: 12.8 g/dL (ref 11.1–15.9)
Hepatitis B Surface Ag: NEGATIVE
Immature Grans (Abs): 0 10*3/uL (ref 0.0–0.1)
Immature Granulocytes: 0 %
Lymphocytes Absolute: 0.4 10*3/uL — ABNORMAL LOW (ref 0.7–3.1)
Lymphs: 5 %
MCH: 29.8 pg (ref 26.6–33.0)
MCHC: 34.1 g/dL (ref 31.5–35.7)
MCV: 87 fL (ref 79–97)
Monocytes Absolute: 0.9 10*3/uL (ref 0.1–0.9)
Monocytes: 12 %
Neutrophils Absolute: 6.2 10*3/uL (ref 1.4–7.0)
Neutrophils: 83 %
Platelets: 286 10*3/uL (ref 150–450)
RBC: 4.29 x10E6/uL (ref 3.77–5.28)
RDW: 13.3 % (ref 11.7–15.4)
RPR Ser Ql: NONREACTIVE
Rh Factor: POSITIVE
Rubella Antibodies, IGG: 19.5 index (ref >0.99)
WBC: 7.5 10*3/uL (ref 3.4–10.8)

## 2020-04-06 LAB — INTEGRATED 1
Crown Rump Length Twin B: 63.1 mm
Crown Rump Length: 59.4 mm
Gest. Age on Collection Date: 12.6 weeks
Maternal Age at EDD: 24.3 yr
NT Twin B: 1.5 mm
Nuchal Translucency (NT): 1.5 mm
Number of Fetuses: 2
PAPP-A Value: 2162.2 ng/mL
Weight: 202 [lb_av]

## 2020-04-06 LAB — HCV INTERPRETATION

## 2020-04-10 ENCOUNTER — Telehealth: Payer: Self-pay | Admitting: *Deleted

## 2020-04-10 NOTE — Telephone Encounter (Signed)
Pt states she has not been able to feel the babies move like usual. This is day 3. No spotting or bleeding. I spoke with Tish, RN. Pt was advised we don't recommend kick counts until 28 weeks and as long as she is feeling some movement, that's good. Pt voiced understanding. JSY

## 2020-04-21 ENCOUNTER — Ambulatory Visit (INDEPENDENT_AMBULATORY_CARE_PROVIDER_SITE_OTHER): Payer: Medicaid Other | Admitting: Obstetrics & Gynecology

## 2020-04-21 ENCOUNTER — Other Ambulatory Visit: Payer: Self-pay

## 2020-04-21 ENCOUNTER — Encounter: Payer: Self-pay | Admitting: Obstetrics & Gynecology

## 2020-04-21 ENCOUNTER — Ambulatory Visit (INDEPENDENT_AMBULATORY_CARE_PROVIDER_SITE_OTHER): Payer: Medicaid Other

## 2020-04-21 VITALS — BP 124/71 | HR 81 | Wt 211.0 lb

## 2020-04-21 DIAGNOSIS — O30032 Twin pregnancy, monochorionic/diamniotic, second trimester: Secondary | ICD-10-CM | POA: Diagnosis not present

## 2020-04-21 DIAGNOSIS — O0992 Supervision of high risk pregnancy, unspecified, second trimester: Secondary | ICD-10-CM | POA: Diagnosis not present

## 2020-04-21 NOTE — Progress Notes (Signed)
[place] MO/DI twins,20+1 wks,normal ovaries,cx 3.5 cm BABY A:breech,anterior placenta gr 0,fhr 137 bpm,LVEICF 2 mm,svp of fluid 6.3 cm,EFW 353 60% BABY B:breech,anterior placenta gr 0,svp of fluid 5.1 cm,fhr 154 bpm,efw 346 g 55%,discordance 1.9%

## 2020-04-21 NOTE — Progress Notes (Signed)
   HIGH-RISK PREGNANCY VISIT Patient name: Christina Conley MRN [id number]  Date of birth: [birth year] Chief Complaint:   High Risk Gestation  History of Present Illness:   Christina Conley is a 25 y.o. G71P1011 female at [redacted]w[redacted]d with an Estimated Date of Delivery: 09/07/20 being seen today for ongoing management of a high-risk pregnancy complicated by Monochorionic diamniotic twins.  Today she reports no complaints.  Depression screen [facility] 2/9 02/26/2020 01/14/2020 10/15/2012 10/15/2012  Decreased Interest 3 3 0 0  Down, Depressed, Hopeless 1 0 1 1  PHQ - 2 Score 4 3 1 1   Altered sleeping 3 3 2 2   Tired, decreased energy 3 3 1  -  Change in appetite 2 3 2  -  Feeling bad or failure about yourself  0 1 1 -  Trouble concentrating 0 0 0 -  Moving slowly or fidgety/restless 0 0 0 -  Suicidal thoughts 0 0 0 -  PHQ-9 Score 12 13 7 3     Contractions: Not present. Vag. Bleeding: None.  Movement: Present. denies leaking of fluid.  Review of Systems:   Pertinent items are noted in HPI Denies abnormal vaginal discharge w/ itching/odor/irritation, headaches, visual changes, shortness of breath, chest pain, abdominal pain, severe nausea/vomiting, or problems with urination or bowel movements unless otherwise stated above. Pertinent History Reviewed:  Reviewed past medical,surgical, social, obstetrical and family history.  Reviewed problem list, medications and allergies. Physical Assessment:   Vitals:   04/21/20 1108  BP: 124/71  Pulse: 81  Weight: 211 lb (95.7 kg)  Body mass index is 29.43 kg/m.           Physical Examination:   General appearance: alert, well appearing, and in no distress  Mental status: alert, oriented to person, place, and time  Skin: warm & dry   Extremities:      Cardiovascular: normal heart rate noted  Respiratory: normal respiratory effort, no distress  Abdomen: gravid, soft, non-tender  Pelvic: Cervical exam deferred         Fetal Status:     Movement: Present    Fetal  Surveillance Testing today: sonogram with excellent concordant growth, see full report   Chaperone: N/A    No results found for this or any previous visit (from the past 24 hour(s)).  Assessment & Plan:  High-risk pregnancy: G3P1011 at [redacted]w[redacted]d with an Estimated Date of Delivery: 09/07/20   1) MonoDi twins, stable, see sonogram report today, good equivalent growth patterns with normal equivalent fluid volumes, no evidence of TTT    Meds: No orders of the defined types were placed in this encounter.   Labs/procedures today: U/S  Treatment Plan:  As above noted  Reviewed: Preterm labor symptoms and general obstetric precautions including but not limited to vaginal bleeding, contractions, leaking of fluid and fetal movement were reviewed in detail with the patient.  All questions were answered. Has home bp cuff. Rx faxed to . Check bp weekly, let know if >140/90.   Follow-up: Return in about 2 weeks (around 05/05/2020) for HROB, 4 weeks sonogram, HROB.   No future appointments.  No orders of the defined types were placed in this encounter.  06/21/20  04/21/2020 11:34 AM

## 2020-05-04 ENCOUNTER — Ambulatory Visit: Payer: Medicaid Other | Admitting: Obstetrics & Gynecology

## 2020-05-04 ENCOUNTER — Encounter: Payer: Self-pay | Admitting: Obstetrics & Gynecology

## 2020-05-04 ENCOUNTER — Other Ambulatory Visit: Payer: Self-pay

## 2020-05-04 VITALS — BP 130/69 | HR 100 | Wt 214.4 lb

## 2020-05-04 DIAGNOSIS — Z1389 Encounter for screening for other disorder: Secondary | ICD-10-CM

## 2020-05-04 DIAGNOSIS — Z3A22 22 weeks gestation of pregnancy: Secondary | ICD-10-CM

## 2020-05-04 DIAGNOSIS — O30032 Twin pregnancy, monochorionic/diamniotic, second trimester: Secondary | ICD-10-CM

## 2020-05-04 DIAGNOSIS — O0992 Supervision of high risk pregnancy, unspecified, second trimester: Secondary | ICD-10-CM

## 2020-05-04 LAB — POCT URINALYSIS DIPSTICK OB
Blood, UA: NEGATIVE
Glucose, UA: NEGATIVE
Ketones, UA: NEGATIVE
Leukocytes, UA: NEGATIVE
Nitrite, UA: NEGATIVE

## 2020-05-04 NOTE — Progress Notes (Signed)
HIGH-RISK PREGNANCY VISIT Patient name: Christina Conley MRN [id number]  Date of birth: 02-19-1996 Chief Complaint:   Routine Prenatal Visit and High Risk Gestation  History of Present Illness:   Christina Conley is a 25 y.o. G22P1011 female at [redacted]w[redacted]d with an Estimated Date of Delivery: 09/07/20 being seen today for ongoing management of a high-risk pregnancy complicated by : -Mono/Di twins.  Today she reports lower extremity swelling.   Contractions: Not present. Vag. Bleeding: None.  Movement: Present. denies leaking of fluid.   Depression screen [facility] 2/9 02/26/2020 01/14/2020 10/15/2012 10/15/2012  Decreased Interest 3 3 0 0  Down, Depressed, Hopeless 1 0 1 1  PHQ - 2 Score 4 3 1 1   Altered sleeping 3 3 2 2   Tired, decreased energy 3 3 1  -  Change in appetite 2 3 2  -  Feeling bad or failure about yourself  0 1 1 -  Trouble concentrating 0 0 0 -  Moving slowly or fidgety/restless 0 0 0 -  Suicidal thoughts 0 0 0 -  PHQ-9 Score 12 13 7 3      Current Outpatient Medications  Medication Instructions  . aspirin 162 mg, Oral, Daily  . Blood Pressure Monitor MISC For regular home bp monitoring during pregnancy  . butalbital-acetaminophen-caffeine (FIORICET) 50-325-40 MG tablet 1-2 tablets, Oral, Every 6 hours PRN  . Prenatal Vit-Fe Fumarate-FA (PRENATAL VITAMIN PO) Oral     Review of Systems:   Pertinent items are noted in HPI Denies abnormal vaginal discharge w/ itching/odor/irritation, headaches, visual changes, shortness of breath, chest pain, abdominal pain, severe nausea/vomiting, or problems with urination or bowel movements unless otherwise stated above. Pertinent History Reviewed:  Reviewed past medical,surgical, social, obstetrical and family history.  Reviewed problem list, medications and allergies. Physical Assessment:   Vitals:   05/04/20 1627  BP: 130/69  Pulse: 100  Weight: 214 lb 6.4 oz (97.3 kg)  Body mass index is 29.9 kg/m.           Physical Examination:    General appearance: alert, well appearing, and in no distress  Mental status: alert, oriented to person, place, and time  Skin: warm & dry   Extremities: Edema: Trace    Cardiovascular: normal heart rate noted  Respiratory: normal respiratory effort, no distress  Abdomen: gravid, soft, non-tender  Pelvic: Cervical exam deferred         Fetal Status: Fetal Heart Rate (bpm): 145/140   Movement: Present    Fetal Surveillance Testing today: bedside   Chaperone: N/A    Results for orders placed or performed in visit on 05/04/20 (from the past 24 hour(s))  POC Urinalysis Dipstick OB   Collection Time: 05/04/20  4:30 PM  Result Value Ref Range   Color, UA     Clarity, UA     Glucose, UA Negative Negative   Bilirubin, UA     Ketones, UA neg    Spec Grav, UA     Blood, UA neg    pH, UA     POC,PROTEIN,UA Trace Negative, Trace, Small (1+), Moderate (2+), Large (3+), 4+   Urobilinogen, UA     Nitrite, UA neg    Leukocytes, UA Negative Negative   Appearance     Odor       Assessment & Plan:  High-risk pregnancy: G3P1011 at [redacted]w[redacted]d with an Estimated Date of Delivery: 09/07/20   1) Mono/Di twins -[place] scheduled every 2 weeks -@ 28wks start BPP weekly with alternating NST @  32wks -Delivery @ 36wks  Meds: No orders of the defined types were placed in this encounter.   Labs/procedures today: none  Treatment Plan:  Prenatal care as outlined above  Reviewed: Preterm labor symptoms and general obstetric precautions including but not limited to vaginal bleeding, contractions, leaking of fluid and fetal movement were reviewed in detail with the patient.  All questions were answered. Pt has home bp cuff. Check bp weekly, let us know if >140/90.   Follow-up: Return in about 2 weeks (around 05/18/2020) for pt to have [place] every 2 wks- next scheduled on 3/28, HROB visit.   Future Appointments  Date Time Provider Department Center  05/18/2020  1:30 PM [facility] - [facility] [place] [facility] None   05/18/2020  2:50 PM Lazaro Arms, MD [facility]  06/01/2020  2:00 PM [facility] - [facility] [place] [facility] None  06/01/2020  3:10 PM Myna Hidalgo, DO [facility]  06/15/2020  1:30 PM [facility] - [facility] [place] [facility] None  06/15/2020  2:30 PM Lazaro Arms, MD [facility]  06/29/2020  1:30 PM [facility] [place] [facility] None  06/29/2020  2:30 PM Lazaro Arms, MD [facility]  07/13/2020  1:30 PM [facility] [place] [facility] None  07/13/2020  2:30 PM Myna Hidalgo, DO [facility]  07/21/2020  1:30 PM [facility] [place] [facility] None  07/21/2020  2:30 PM Lazaro Arms, MD [facility]  07/28/2020  1:30 PM [facility] [place] [facility] None  07/28/2020  2:30 PM Lazaro Arms, MD [facility]  08/04/2020  1:30 PM [facility] - [facility] [place] [facility] None  08/04/2020  2:30 PM Lazaro Arms, MD [facility]  08/11/2020  1:30 PM [facility] [place] [facility] None  08/11/2020  2:30 PM Cheral Marker, CNM [facility]  08/18/2020  1:30 PM [facility] [place] [facility] None  08/18/2020  2:30 PM Lazaro Arms, MD [facility]  08/25/2020  1:30 PM [facility] [place] [facility] None  08/25/2020  2:30 PM Eure, Amaryllis Dyke, MD [facility]    Orders Placed This Encounter  Procedures  . POC Urinalysis Dipstick OB    Myna Hidalgo, DO Attending Obstetrician & Gynecologist, [facility] for [employer], [facility]

## 2020-05-15 ENCOUNTER — Other Ambulatory Visit: Payer: Self-pay | Admitting: Obstetrics & Gynecology

## 2020-05-15 DIAGNOSIS — O30032 Twin pregnancy, monochorionic/diamniotic, second trimester: Secondary | ICD-10-CM

## 2020-05-18 ENCOUNTER — Ambulatory Visit (INDEPENDENT_AMBULATORY_CARE_PROVIDER_SITE_OTHER): Payer: Medicaid Other | Admitting: Obstetrics & Gynecology

## 2020-05-18 ENCOUNTER — Other Ambulatory Visit: Payer: Self-pay

## 2020-05-18 ENCOUNTER — Ambulatory Visit: Payer: Medicaid Other

## 2020-05-18 ENCOUNTER — Encounter: Payer: Self-pay | Admitting: Obstetrics & Gynecology

## 2020-05-18 VITALS — BP 132/80 | HR 88 | Wt 219.0 lb

## 2020-05-18 DIAGNOSIS — O30032 Twin pregnancy, monochorionic/diamniotic, second trimester: Secondary | ICD-10-CM

## 2020-05-18 DIAGNOSIS — O0992 Supervision of high risk pregnancy, unspecified, second trimester: Secondary | ICD-10-CM

## 2020-05-18 NOTE — Progress Notes (Signed)
HIGH-RISK PREGNANCY VISIT Patient name: Christina Conley MRN [id number]  Date of birth: 1995-10-14 Chief Complaint:   Routine Prenatal Visit  History of Present Illness:   Christina Conley is a 25 y.o. G45P1011 female at [redacted]w[redacted]d with an Estimated Date of Delivery: 09/07/20 being seen today for ongoing management of a high-risk pregnancy complicated by Mono/Di twins.  Today she reports no complaints.  Depression screen [facility] 2/9 02/26/2020 01/14/2020 10/15/2012 10/15/2012  Decreased Interest 3 3 0 0  Down, Depressed, Hopeless 1 0 1 1  PHQ - 2 Score 4 3 1 1   Altered sleeping 3 3 2 2   Tired, decreased energy 3 3 1  -  Change in appetite 2 3 2  -  Feeling bad or failure about yourself  0 1 1 -  Trouble concentrating 0 0 0 -  Moving slowly or fidgety/restless 0 0 0 -  Suicidal thoughts 0 0 0 -  PHQ-9 Score 12 13 7 3     Contractions: Not present. Vag. Bleeding: None.  Movement: Present. denies leaking of fluid.  Review of Systems:   Pertinent items are noted in HPI Denies abnormal vaginal discharge w/ itching/odor/irritation, headaches, visual changes, shortness of breath, chest pain, abdominal pain, severe nausea/vomiting, or problems with urination or bowel movements unless otherwise stated above. Pertinent History Reviewed:  Reviewed past medical,surgical, social, obstetrical and family history.  Reviewed problem list, medications and allergies. Physical Assessment:   Vitals:   05/18/20 1446  BP: 132/80  Pulse: 88  Weight: 219 lb (99.3 kg)  Body mass index is 30.54 kg/m.           Physical Examination:   General appearance: alert, well appearing, and in no distress  Mental status: alert, oriented to person, place, and time  Skin: warm & dry   Extremities: Edema: Trace    Cardiovascular: normal heart rate noted  Respiratory: normal respiratory effort, no distress  Abdomen: gravid, soft, non-tender  Pelvic: Cervical exam deferred         Fetal Status:     Movement: Present    Fetal  Surveillance Testing today: sonogram today   Chaperone: n/a    No results found for this or any previous visit (from the past 24 hour(s)).  Assessment & Plan:  High-risk pregnancy: G3P1011 at [redacted]w[redacted]d with an Estimated Date of Delivery: 09/07/20   1) Mono Di twins with excellent and equal growth, stable    Meds: No orders of the defined types were placed in this encounter.   Labs/procedures today: U/S  Treatment Plan:  Sonogram 4 weeks then weekly BPP  Reviewed: Preterm labor symptoms and general obstetric precautions including but not limited to vaginal bleeding, contractions, leaking of fluid and fetal movement were reviewed in detail with the patient.  All questions were answered. Does have home bp cuff. Office bp cuff given: not applicable. Check bp daily, let know if consistently >140 and/or >90.  Follow-up: No follow-ups on file.   Future Appointments  Date Time Provider Department Center  06/01/2020  2:00 PM [facility] - [facility]TO[facility]YN [redacted]w[redacted]d [facility] None  06/01/2020  3:10 PM [place], DO [facility]  06/15/2020  1:30 PM [facility] - [facility] None  06/15/2020  2:30 PM 08/01/2020, MD [facility] FTOBGYN  06/29/2020  1:30 PM [facility] - [facility] 06/17/2020 [facility] None  06/29/2020  2:30 PM 06/17/2020, MD [facility]YN  07/13/2020  1:30 PM [facility] - [facility] 08/29/2020 [facility] None  07/13/2020  2:30 PM 08/29/2020,  DO [facility]YN  07/21/2020  1:30 PM [facility] - [facility] [place] [facility] None  07/21/2020  2:30 PM Lazaro Arms, MD [facility]  07/28/2020  1:30 PM [facility] [place] [facility] None  07/28/2020  2:30 PM Lazaro Arms, MD [facility]  08/04/2020  1:30 PM [facility] [place] [facility] None  08/04/2020  2:30 PM Lazaro Arms, MD [facility]  08/11/2020  1:30 PM [facility] [place] [facility] None  08/11/2020  2:30 PM Cheral Marker, CNM [facility]  08/18/2020  1:30 PM [facility] [place] [facility] None  08/18/2020  2:30 PM Lazaro Arms, MD [facility]  08/25/2020  1:30 PM [facility] - [facility] [place]  [facility] None  08/25/2020  2:30 PM Lazaro Arms, MD [facility]    No orders of the defined types were placed in this encounter.  Lazaro Arms  05/18/2020 3:29 PM

## 2020-05-18 NOTE — Progress Notes (Signed)
[place] 24 wks,MO/DI twins,normal ovaries,cx length 3.1 cm BABY A:cephalic inferior,anterior placenta gr 0,SVP of fluid 4.8 cm,fhr 154 bpm,mild left renal pelvic dilation 5.1 mm,right renal pelvis WNL,LVEICF 1.9 mm,EFW 652 g 42% BABY B:cephalic superior right,anterior placenta gr 0,SVP of fluid 3.8 cm,fhr 140 bpm,EFW 666g 48%,discordance 2.1%

## 2020-05-19 ENCOUNTER — Encounter: Payer: Self-pay | Admitting: Advanced Practice Midwife

## 2020-06-01 ENCOUNTER — Other Ambulatory Visit: Payer: Medicaid Other

## 2020-06-01 ENCOUNTER — Encounter: Payer: Medicaid Other | Admitting: Obstetrics & Gynecology

## 2020-06-12 ENCOUNTER — Other Ambulatory Visit: Payer: Self-pay | Admitting: Obstetrics & Gynecology

## 2020-06-12 DIAGNOSIS — O30032 Twin pregnancy, monochorionic/diamniotic, second trimester: Secondary | ICD-10-CM

## 2020-06-15 ENCOUNTER — Other Ambulatory Visit: Payer: Medicaid Other

## 2020-06-15 ENCOUNTER — Ambulatory Visit (INDEPENDENT_AMBULATORY_CARE_PROVIDER_SITE_OTHER): Payer: Medicaid Other

## 2020-06-15 ENCOUNTER — Ambulatory Visit: Payer: Medicaid Other | Admitting: Obstetrics & Gynecology

## 2020-06-15 ENCOUNTER — Other Ambulatory Visit: Payer: Self-pay

## 2020-06-15 VITALS — BP 125/76 | HR 100 | Wt 234.5 lb

## 2020-06-15 DIAGNOSIS — O30032 Twin pregnancy, monochorionic/diamniotic, second trimester: Secondary | ICD-10-CM | POA: Diagnosis not present

## 2020-06-15 DIAGNOSIS — O0993 Supervision of high risk pregnancy, unspecified, third trimester: Secondary | ICD-10-CM | POA: Diagnosis not present

## 2020-06-15 DIAGNOSIS — Z131 Encounter for screening for diabetes mellitus: Secondary | ICD-10-CM

## 2020-06-15 DIAGNOSIS — Z3A28 28 weeks gestation of pregnancy: Secondary | ICD-10-CM | POA: Diagnosis not present

## 2020-06-15 DIAGNOSIS — O0992 Supervision of high risk pregnancy, unspecified, second trimester: Secondary | ICD-10-CM

## 2020-06-15 LAB — POCT URINALYSIS DIPSTICK OB
Blood, UA: NEGATIVE
Glucose, UA: NEGATIVE
Ketones, UA: NEGATIVE
Leukocytes, UA: NEGATIVE
Nitrite, UA: NEGATIVE

## 2020-06-15 NOTE — Progress Notes (Signed)
[place] 28 wks,MO/DI twins,cx 3.1 cm,anterior placenta gr 2 BABY A:cephalic,fhr 148 bpm,svp of fluid 4.3 cm,efw 1351 g 81% BABY B: superior transverse head left,svp of fluid 5 cm,EFW 1268 g 64%,fhr 150 bpm,discordance 6.2%

## 2020-06-16 ENCOUNTER — Ambulatory Visit (INDEPENDENT_AMBULATORY_CARE_PROVIDER_SITE_OTHER): Payer: Medicaid Other | Admitting: Obstetrics & Gynecology

## 2020-06-16 ENCOUNTER — Encounter: Payer: Self-pay | Admitting: Obstetrics & Gynecology

## 2020-06-16 ENCOUNTER — Other Ambulatory Visit: Payer: Self-pay | Admitting: Obstetrics & Gynecology

## 2020-06-16 VITALS — BP 115/70 | HR 99 | Wt 233.0 lb

## 2020-06-16 DIAGNOSIS — G5600 Carpal tunnel syndrome, unspecified upper limb: Secondary | ICD-10-CM

## 2020-06-16 DIAGNOSIS — F5089 Other specified eating disorder: Secondary | ICD-10-CM

## 2020-06-16 DIAGNOSIS — O30031 Twin pregnancy, monochorionic/diamniotic, first trimester: Secondary | ICD-10-CM

## 2020-06-16 DIAGNOSIS — G5603 Carpal tunnel syndrome, bilateral upper limbs: Secondary | ICD-10-CM | POA: Diagnosis not present

## 2020-06-16 DIAGNOSIS — O0993 Supervision of high risk pregnancy, unspecified, third trimester: Secondary | ICD-10-CM

## 2020-06-16 DIAGNOSIS — O26899 Other specified pregnancy related conditions, unspecified trimester: Secondary | ICD-10-CM

## 2020-06-16 LAB — GLUCOSE TOLERANCE, 2 HOURS W/ 1HR
Glucose, 1 hour: 103 mg/dL (ref 65–179)
Glucose, 2 hour: 72 mg/dL (ref 65–152)
Glucose, Fasting: 78 mg/dL (ref 65–91)

## 2020-06-16 LAB — CBC
Hematocrit: 26.8 % — ABNORMAL LOW (ref 34.0–46.6)
Hemoglobin: 8.7 g/dL — ABNORMAL LOW (ref 11.1–15.9)
MCH: 27.2 pg (ref 26.6–33.0)
MCHC: 32.5 g/dL (ref 31.5–35.7)
MCV: 84 fL (ref 79–97)
Platelets: 293 10*3/uL (ref 150–450)
RBC: 3.2 x10E6/uL — ABNORMAL LOW (ref 3.77–5.28)
RDW: 13.2 % (ref 11.7–15.4)
WBC: 12.5 10*3/uL — ABNORMAL HIGH (ref 3.4–10.8)

## 2020-06-16 LAB — HIV ANTIBODY (ROUTINE TESTING W REFLEX): HIV Screen 4th Generation wRfx: NONREACTIVE

## 2020-06-16 LAB — ANTIBODY SCREEN: Antibody Screen: NEGATIVE

## 2020-06-16 LAB — RPR: RPR Ser Ql: NONREACTIVE

## 2020-06-16 MED ORDER — FERROUS SULFATE 325 (65 FE) MG PO TABS: 325.0000 mg | ORAL_TABLET | ORAL | 3 refills | Status: DC

## 2020-06-16 NOTE — Progress Notes (Signed)
HIGH-RISK PREGNANCY VISIT Patient name: Christina Conley MRN [id number]  Date of birth: [birth year] Chief Complaint:   Routine Prenatal Visit (U/S yesterday)  History of Present Illness:   Christina Conley is a 25 y.o. G106P1011 female at [redacted]w[redacted]d with an Estimated Date of Delivery: 09/07/20 being seen today for ongoing management of a high-risk pregnancy complicated by Mono/di twins.  Today she reports carpal tunnel, PICA, usual musculoskeletal complaints.  Depression screen [facility] 2/9 02/26/2020 01/14/2020 10/15/2012 10/15/2012  Decreased Interest 3 3 0 0  Down, Depressed, Hopeless 1 0 1 1  PHQ - 2 Score 4 3 1 1   Altered sleeping 3 3 2 2   Tired, decreased energy 3 3 1  -  Change in appetite 2 3 2  -  Feeling bad or failure about yourself  0 1 1 -  Trouble concentrating 0 0 0 -  Moving slowly or fidgety/restless 0 0 0 -  Suicidal thoughts 0 0 0 -  PHQ-9 Score 12 13 7 3     Contractions: Irregular.  .  Movement: Present. denies leaking of fluid.  Review of Systems:   Pertinent items are noted in HPI Denies abnormal vaginal discharge w/ itching/odor/irritation, headaches, visual changes, shortness of breath, chest pain, abdominal pain, severe nausea/vomiting, or problems with urination or bowel movements unless otherwise stated above. Pertinent History Reviewed:  Reviewed past medical,surgical, social, obstetrical and family history.  Reviewed problem list, medications and allergies. Physical Assessment:   Vitals:   06/16/20 1408  BP: 115/70  Pulse: 99  Weight: 233 lb (105.7 kg)  Body mass index is 32.5 kg/m.           Physical Examination:   General appearance: alert, well appearing, and in no distress  Mental status: alert, oriented to person, place, and time  Skin: warm & dry   Extremities: Edema: Trace    Cardiovascular: normal heart rate noted  Respiratory: normal respiratory effort, no distress  Abdomen: gravid, soft, non-tender  Pelvic: Cervical exam deferred         Fetal Status:      Movement: Present    Fetal Surveillance Testing today: sonogram yesterday report done   Chaperone: N/A    No results found for this or any previous visit (from the past 24 hour(s)).  Assessment & Plan:  High-risk pregnancy: G3P1011 at [redacted]w[redacted]d with an Estimated Date of Delivery: 09/07/20   1) Mono/di twins: good growth for both, no evidence of TTT, stable  2) Anemia, PICA, ice/starch, pt to stop her ice starch eating, begin Fe qod + PNV  3) Carpal tunnel syndrome, braces ordered  Meds: No orders of the defined types were placed in this encounter.   Labs/procedures today: U/S  Treatment Plan:  Sonogram week after next and then weekly, 32 weeks add NST weekly as well for twice weekly surveillance  Reviewed: Preterm labor symptoms and general obstetric precautions including but not limited to vaginal bleeding, contractions, leaking of fluid and fetal movement were reviewed in detail with the patient.  All questions were answered. Does have home bp cuff. Office bp cuff given: not applicable. Check bp daily, let know if consistently >140 and/or >90.  Follow-up: No follow-ups on file.   Future Appointments  Date Time Provider Department Center  06/29/2020  1:30 PM [facility] - FTOBGYN [redacted]w[redacted]d [facility] None  06/29/2020  2:30 PM [place], MD [facility]BGYN  07/13/2020  1:30 PM [facility] None  07/13/2020  2:30 PM 08/29/2020, DO  [facility]YN  07/21/2020  1:30 PM [facility] - [facility] [place] [facility] None  07/21/2020  2:30 PM Lazaro Arms, MD [facility]  07/28/2020  1:30 PM [facility] - [facility] [place] [facility] None  07/28/2020  2:30 PM Myna Hidalgo, DO [facility]  08/04/2020  1:30 PM [facility] - [facility] [place] [facility] None  08/04/2020  2:30 PM Lazaro Arms, MD [facility]  08/11/2020  1:30 PM [facility] - [facility] [place] [facility] None  08/11/2020  2:30 PM Cheral Marker, CNM [facility]  08/18/2020  1:30 PM [facility] [place] [facility] None  08/18/2020  2:30 PM Lazaro Arms, MD [facility]   08/25/2020  1:30 PM [facility] [place] [facility] None  08/25/2020  2:30 PM Lazaro Arms, MD [facility]    No orders of the defined types were placed in this encounter.  Lazaro Arms  06/16/2020 2:58 PM

## 2020-06-19 ENCOUNTER — Other Ambulatory Visit: Payer: Self-pay | Admitting: Obstetrics & Gynecology

## 2020-06-19 DIAGNOSIS — O30032 Twin pregnancy, monochorionic/diamniotic, second trimester: Secondary | ICD-10-CM

## 2020-06-23 ENCOUNTER — Encounter (HOSPITAL_COMMUNITY): Payer: Self-pay | Admitting: Obstetrics and Gynecology

## 2020-06-23 ENCOUNTER — Other Ambulatory Visit: Payer: Self-pay

## 2020-06-23 ENCOUNTER — Inpatient Hospital Stay (HOSPITAL_COMMUNITY)
Admission: AD | Admit: 2020-06-23 | Discharge: 2020-06-23 | Disposition: A | Discharge status: Home / Self Care | Payer: Medicaid Other | Attending: Obstetrics and Gynecology | Admitting: Obstetrics and Gynecology

## 2020-06-23 DIAGNOSIS — O26893 Other specified pregnancy related conditions, third trimester: Secondary | ICD-10-CM

## 2020-06-23 DIAGNOSIS — Z3A29 29 weeks gestation of pregnancy: Secondary | ICD-10-CM | POA: Diagnosis not present

## 2020-06-23 DIAGNOSIS — O30033 Twin pregnancy, monochorionic/diamniotic, third trimester: Secondary | ICD-10-CM | POA: Diagnosis not present

## 2020-06-23 DIAGNOSIS — R109 Unspecified abdominal pain: Secondary | ICD-10-CM | POA: Diagnosis not present

## 2020-06-23 DIAGNOSIS — N949 Unspecified condition associated with female genital organs and menstrual cycle: Secondary | ICD-10-CM

## 2020-06-23 DIAGNOSIS — Z87891 Personal history of nicotine dependence: Secondary | ICD-10-CM | POA: Insufficient documentation

## 2020-06-23 LAB — URINALYSIS, ROUTINE W REFLEX MICROSCOPIC
Bilirubin Urine: NEGATIVE
Glucose, UA: NEGATIVE mg/dL
Hgb urine dipstick: NEGATIVE
Ketones, ur: NEGATIVE mg/dL
Leukocytes,Ua: NEGATIVE
Nitrite: NEGATIVE
Protein, ur: NEGATIVE mg/dL
Specific Gravity, Urine: 1.012 (ref 1.005–1.030)
pH: 6 (ref 5.0–8.0)

## 2020-06-23 MED ORDER — CYCLOBENZAPRINE HCL 5 MG PO TABS
10.0000 mg | ORAL_TABLET | Freq: Once | ORAL | Status: AC
  Administered 2020-06-23: 10 mg via ORAL
  Filled 2020-06-23: qty 2

## 2020-06-23 MED ORDER — CYCLOBENZAPRINE HCL 10 MG PO TABS: 10.0000 mg | ORAL_TABLET | Freq: Three times a day (TID) | ORAL | 0 refills | Status: DC | PRN

## 2020-06-23 MED ORDER — ACETAMINOPHEN 500 MG PO TABS
1000.0000 mg | ORAL_TABLET | Freq: Once | ORAL | Status: AC
  Administered 2020-06-23: 1000 mg via ORAL
  Filled 2020-06-23: qty 2

## 2020-06-23 NOTE — MAU Note (Signed)
Pt reports abd cramping since 1630, off/on. Reports positive fetal movment. Pt is mo/di twins. Denies bleeding or ROM

## 2020-06-23 NOTE — Discharge Instructions (Signed)

## 2020-06-23 NOTE — MAU Provider Note (Signed)
History     [id number]  Arrival date and time: 06/23/20 2147    Chief Complaint  Patient presents with  . Abdominal Pain     HPI Christina Conley is a 25 y.o. at [redacted]w[redacted]d by 8 wk u/s with PMHx notable for mono/di twins, who presents for intermittent abdominal pain and cramping for the past 5-6 hours, no prior symptoms. Does not feel as though these are contractions. Denies VB or LOF. +FM. No dysuria, urgency, frequency, hematuria. No abnormal vaginal discharge. No back pain. No recent illness or sick contacts. No cough, congestion, or respiratory symptoms. No n/v/d/c. No fever/chills.   O/Positive/-- (01/05 1550)  OB History    Gravida  3   Para  1   Term  1   Preterm      AB  1   Living  1     SAB  1   IAB      Ectopic      Multiple  0   Live Births  1           Past Medical History:  Diagnosis Date  . Asthma    child.  No meds > 2 years  . BV (bacterial vaginosis) 07/02/2012  . Contraceptive management 05/27/2015  . Environmental allergies   . Gonorrhea 08/2015  . Irregular intermenstrual bleeding 05/27/2015  . LLQ pain 05/27/2015  . Round ligament pain 12/09/2013  . Vaginal discharge 12/09/2013    Past Surgical History:  Procedure Laterality Date  . NO PAST SURGERIES      Family History  Problem Relation Age of Onset  . Hypertension Maternal Grandfather   . Hyperlipidemia Maternal Grandfather   . Crohn's disease Mother   . Other Father        problems with intestines  . Crohn's disease Paternal Grandmother   . Hypertension Maternal Grandmother   . Miscarriages / Stillbirths Maternal Grandmother     Social History   Socioeconomic History  . Marital status: Single    Spouse name: Not on file  . Number of children: Not on file  . Years of education: Not on file  . Highest education level: Not on file  Occupational History  . Not on file  Tobacco Use  . Smoking status: Former Smoker    Packs/day: 0.25    Years: 0.50    Pack years: 0.12     Types: Cigarettes  . Smokeless tobacco: Never Used  Vaping Use  . Vaping Use: Never used  Substance and Sexual Activity  . Alcohol use: No  . Drug use: No    Types: Marijuana    Comment: quit 2015  . Sexual activity: Yes    Birth control/protection: None  Other Topics Concern  . Not on file  Social History Narrative  . Not on file   Social Determinants of Health   Financial Resource Strain: Low Risk   . Difficulty of Paying Living Expenses: Not hard at all  Food Insecurity: No Food Insecurity  . Worried About Programme researcher, broadcasting/film/video in the Last Year: Never true  . Ran Out of Food in the Last Year: Never true  Transportation Needs: No Transportation Needs  . Lack of Transportation (Medical): No  . Lack of Transportation (Non-Medical): No  Physical Activity: Inactive  . Days of Exercise per Week: 0 days  . Minutes of Exercise per Session: 0 min  Stress: Stress Concern Present  . Feeling of Stress : Rather much  Social Connections: Moderately  Isolated  . Frequency of Communication with Friends and Family: More than three times a week  . Frequency of Social Gatherings with Friends and Family: Three times a week  . Attends Religious Services: More than 4 times per year  . Active Member of Clubs or Organizations: No  . Attends Banker Meetings: Never  . Marital Status: Never married  Intimate Partner Violence: Not At Risk  . Fear of Current or Ex-Partner: No  . Emotionally Abused: No  . Physically Abused: No  . Sexually Abused: No    Allergies  Allergen Reactions  . Amoxicillin Hives  . Ampicillin Hives  . Penicillins Hives    Has patient had a PCN reaction causing immediate rash, facial/tongue/throat swelling, SOB or lightheadedness with hypotension: Yes Has patient had a PCN reaction causing severe rash involving mucus membranes or skin necrosis: No Has patient had a PCN reaction that required hospitalization No Has patient had a PCN reaction occurring  within the last 10 years: unknown If all of the above answers are "NO", then may proceed with Cephalosporin use.     No current facility-administered medications on file prior to encounter.   Current Outpatient Medications on File Prior to Encounter  Medication Sig Dispense Refill  . acetaminophen (TYLENOL) 500 MG tablet Take 1,000 mg by mouth every 6 (six) hours as needed.    Marland Kitchen aspirin 81 MG chewable tablet Chew 2 tablets (162 mg total) by mouth daily. 60 tablet 7  . ferrous sulfate 325 (65 FE) MG tablet Take 1 tablet (325 mg total) by mouth every other day. 15 tablet 3  . Prenatal Vit-Fe Fumarate-FA (PRENATAL VITAMIN PO) Take by mouth.    . Blood Pressure Monitor MISC For regular home bp monitoring during pregnancy 1 each 0  . butalbital-acetaminophen-caffeine (FIORICET) 50-325-40 MG tablet Take 1-2 tablets by mouth every 6 (six) hours as needed for headache. (Patient not taking: Reported on 06/16/2020) 20 tablet 0     Review of Systems  Constitutional: Negative for chills and fever.  Eyes: Negative for blurred vision and double vision.  Respiratory: Negative for cough, sputum production and shortness of breath.   Cardiovascular: Negative for chest pain, palpitations and leg swelling.  Gastrointestinal: Positive for abdominal pain. Negative for blood in stool, constipation, diarrhea, heartburn, melena, nausea and vomiting.  Genitourinary: Negative for dysuria, flank pain, hematuria and urgency.  Musculoskeletal: Negative for back pain and myalgias.  Skin: Negative for itching and rash.  Neurological: Negative for dizziness, loss of consciousness, weakness and headaches.     Pertinent positives and negative per HPI, all others reviewed and negative  Physical Exam   BP 103/67 (BP Location: Left Arm)   Pulse (!) 132   Temp 98.1 F (36.7 C) (Oral)   Resp 18   Ht 5\' 11"  (1.803 m)   Wt 106.6 kg   LMP 12/08/2019   SpO2 100%   BMI 32.78 kg/m   Physical Exam Vitals and nursing  note reviewed. Exam conducted with a chaperone present.  Constitutional:      General: She is not in acute distress.    Appearance: Normal appearance. She is normal weight.  HENT:     Head: Normocephalic and atraumatic.     Nose: Nose normal.     Mouth/Throat:     Mouth: Mucous membranes are moist.     Pharynx: Oropharynx is clear.  Eyes:     Extraocular Movements: Extraocular movements intact.     Conjunctiva/sclera: Conjunctivae normal.  Cardiovascular:     Rate and Rhythm: Normal rate.     Pulses: Normal pulses.  Pulmonary:     Effort: Pulmonary effort is normal.  Abdominal:     Palpations: Abdomen is soft.     Tenderness: There is no abdominal tenderness. There is no guarding or rebound.  Musculoskeletal:        General: Normal range of motion.     Cervical back: Normal range of motion and neck supple.  Skin:    General: Skin is warm and dry.  Neurological:     General: No focal deficit present.     Mental Status: She is alert and oriented to person, place, and time. Mental status is at baseline.  Psychiatric:        Mood and Affect: Mood normal.        Behavior: Behavior normal.     Cervical Exam Dilation: Fingertip Effacement (%): Thick Cervical Position: Posterior Station: -3 Exam by:: Germaine Pomfret, MD  Bedside Ultrasound Twin A and B both noted to have FHR wnl, Twin A still cephalic  FHT Twin A and B Baseline 145bpm, mod variability variability, +accels, no decels Toco: quiet Cat: 1  Labs No results found for this or any previous visit (from the past 24 hour(s)).  Imaging No results found.  MAU Course  Procedures  Lab Orders     Urinalysis, Routine w reflex microscopic Urine, Clean Catch Meds ordered this encounter  Medications  . acetaminophen (TYLENOL) tablet 1,000 mg  . cyclobenzaprine (FLEXERIL) tablet 10 mg   Imaging Orders  No imaging studies ordered today    MDM moderate  Assessment and Plan  25 yo G3P1011 at [redacted]w[redacted]d with mono/di  twins presenting to MAU for abdominal cramping/pain.  #Round ligament pain Lower abdominal pain x5-6 hours, intermittent, has not taken any meds. Sharp pain. No associated urinary symptoms or GI symptoms. Does not endorse contraction like pain. Eating/drinking normally. Vital signs remarkable for maternal HR 120-130s, otherwise VSS. Physical exam overall unremarkable, FHT of both twins reassuring, cervical exams unchanged over 1 hour in MAU. Abdominal pain/cramping improved with tylenol and flexeril. Suspect tachycardia is likely pain related given improvement with PO hydration and meds while in MAU, resolved to 90s. Given strict return precautions should she have regular contractions, LOF, VB etc.  #FWB FHT Cat 1 NST: reactive  Alric Seton

## 2020-06-25 DIAGNOSIS — H5213 Myopia, bilateral: Secondary | ICD-10-CM | POA: Diagnosis not present

## 2020-06-29 ENCOUNTER — Ambulatory Visit (INDEPENDENT_AMBULATORY_CARE_PROVIDER_SITE_OTHER): Payer: Medicaid Other

## 2020-06-29 ENCOUNTER — Other Ambulatory Visit: Payer: Self-pay

## 2020-06-29 ENCOUNTER — Ambulatory Visit (INDEPENDENT_AMBULATORY_CARE_PROVIDER_SITE_OTHER): Payer: Medicaid Other | Admitting: Obstetrics & Gynecology

## 2020-06-29 ENCOUNTER — Encounter: Payer: Self-pay | Admitting: Obstetrics & Gynecology

## 2020-06-29 VITALS — BP 116/70 | HR 98 | Wt 234.5 lb

## 2020-06-29 DIAGNOSIS — O30031 Twin pregnancy, monochorionic/diamniotic, first trimester: Secondary | ICD-10-CM

## 2020-06-29 DIAGNOSIS — O0993 Supervision of high risk pregnancy, unspecified, third trimester: Secondary | ICD-10-CM

## 2020-06-29 DIAGNOSIS — O0992 Supervision of high risk pregnancy, unspecified, second trimester: Secondary | ICD-10-CM

## 2020-06-29 DIAGNOSIS — O30032 Twin pregnancy, monochorionic/diamniotic, second trimester: Secondary | ICD-10-CM | POA: Diagnosis not present

## 2020-06-29 DIAGNOSIS — Z3A30 30 weeks gestation of pregnancy: Secondary | ICD-10-CM

## 2020-06-29 LAB — POCT URINALYSIS DIPSTICK OB
Blood, UA: NEGATIVE
Glucose, UA: NEGATIVE
Ketones, UA: NEGATIVE
Nitrite, UA: NEGATIVE
POC,PROTEIN,UA: NEGATIVE

## 2020-06-29 MED ORDER — OMEPRAZOLE 20 MG PO CPDR: 20.0000 mg | DELAYED_RELEASE_CAPSULE | Freq: Every day | ORAL | 6 refills | Status: DC

## 2020-06-29 NOTE — Progress Notes (Signed)
HIGH-RISK PREGNANCY VISIT Patient name: Christina Conley MRN [id number]  Date of birth: [birth year] Chief Complaint:   High Risk Gestation ([place] today)  History of Present Illness:   Christina Conley is a 25 y.o. G27P1011 female at [redacted]w[redacted]d with an Estimated Date of Delivery: 09/07/20 being seen today for ongoing management of a high-risk pregnancy complicated by multiple gestation mono/di twins .  Today she reports no complaints.  Depression screen [facility] 2/9 02/26/2020 01/14/2020 10/15/2012 10/15/2012  Decreased Interest 3 3 0 0  Down, Depressed, Hopeless 1 0 1 1  PHQ - 2 Score 4 3 1 1   Altered sleeping 3 3 2 2   Tired, decreased energy 3 3 1  -  Change in appetite 2 3 2  -  Feeling bad or failure about yourself  0 1 1 -  Trouble concentrating 0 0 0 -  Moving slowly or fidgety/restless 0 0 0 -  Suicidal thoughts 0 0 0 -  PHQ-9 Score 12 13 7 3     Contractions: Not present. Vag. Bleeding: None.  Movement: Present. denies leaking of fluid.  Review of Systems:   Pertinent items are noted in HPI Denies abnormal vaginal discharge w/ itching/odor/irritation, headaches, visual changes, shortness of breath, chest pain, abdominal pain, severe nausea/vomiting, or problems with urination or bowel movements unless otherwise stated above. Pertinent History Reviewed:  Reviewed past medical,surgical, social, obstetrical and family history.  Reviewed problem list, medications and allergies. Physical Assessment:   Vitals:   06/29/20 1446  BP: 116/70  Pulse: 98  Weight: 234 lb 8 oz (106.4 kg)  Body mass index is 32.71 kg/m.           Physical Examination:   General appearance: alert, well appearing, and in no distress  Mental status: alert, oriented to person, place, and time  Skin: warm & dry   Extremities: Edema: Trace    Cardiovascular: normal heart rate noted  Respiratory: normal respiratory effort, no distress  Abdomen: gravid, soft, non-tender  Pelvic: Cervical exam deferred         Fetal Status:      Movement: Present    Fetal Surveillance Testing today: sonogram   Chaperone: N/A    Results for orders placed or performed in visit on 06/29/20 (from the past 24 hour(s))  POC Urinalysis Dipstick OB   Collection Time: 06/29/20  2:47 PM  Result Value Ref Range   Color, UA     Clarity, UA     Glucose, UA Negative Negative   Bilirubin, UA     Ketones, UA neg    Spec Grav, UA     Blood, UA neg    pH, UA     POC,PROTEIN,UA Negative Negative, Trace, Small (1+), Moderate (2+), Large (3+), 4+   Urobilinogen, UA     Nitrite, UA neg    Leukocytes, UA Small (1+) (A) Negative   Appearance     Odor      Assessment & Plan:  High-risk pregnancy: G3P1011 at [redacted]w[redacted]d with an Estimated Date of Delivery: 09/07/20   1) Mono di twins, no evidence of TTT, good growth and surveillance,     Meds: No orders of the defined types were placed in this encounter.   Labs/procedures today: U/S  Treatment Plan:  Weekly BPP til 32 weeks then twice weekly surveillance  Reviewed: Preterm labor symptoms and general obstetric precautions including but not limited to vaginal bleeding, contractions, leaking of fluid and fetal movement were reviewed in detail with the patient.  All questions were answered. Does have home bp cuff. Office bp cuff given: not applicable. Check bp daily, let us know if consistently >140 and/or >90.  Follow-up: Return in about 1 week (around 07/06/2020) for BPP/sono, HROB.   Future Appointments  Date Time Provider Department Center  07/13/2020  1:30 PM [facility] - [facility] [place] [facility] None  07/13/2020  2:30 PM Myna Hidalgo, DO [facility]  07/21/2020  1:30 PM [facility] - [facility] [place] [facility] None  07/21/2020  2:30 PM Lazaro Arms, MD [facility]  07/28/2020  1:30 PM [facility] - [facility] [place] [facility] None  07/28/2020  2:30 PM Myna Hidalgo, DO [facility]  08/04/2020  1:30 PM [facility] - [facility] [place] [facility] None  08/04/2020  2:30 PM Lazaro Arms, MD [facility]  08/11/2020  1:30 PM [facility] - [facility]  [place] [facility] None  08/11/2020  2:30 PM Cheral Marker, CNM [facility]  08/18/2020  1:30 PM [facility] - [facility] [place] [facility] None  08/18/2020  2:30 PM Lazaro Arms, MD [facility]  08/25/2020  1:30 PM [facility] - [facility] [place] [facility] None  08/25/2020  2:30 PM [place] Nonaka, Amaryllis Dyke, MD [facility]    Orders Placed This Encounter  Procedures  . POC Urinalysis Dipstick OB   Lazaro Arms CNM, [facility] 06/29/2020 3:06 PM

## 2020-06-29 NOTE — Addendum Note (Signed)
Addended by: Lazaro Arms on: 06/29/2020 03:18 PM   Modules accepted: Orders

## 2020-06-29 NOTE — Progress Notes (Signed)
[place] MO/DI TWINS: 30 wks,anterior placenta gr 2 BABY A: cephalic left inferior,BPP 8/8,FHR 132 BPM,SVP of fluid 5.6 cm BABY B: cephalic right superior,BPP 8/8,FHR 150 BPM,svp of fluid 5.2 cm

## 2020-07-06 ENCOUNTER — Other Ambulatory Visit: Payer: Self-pay

## 2020-07-06 ENCOUNTER — Encounter: Payer: Self-pay | Admitting: Obstetrics & Gynecology

## 2020-07-06 ENCOUNTER — Ambulatory Visit (INDEPENDENT_AMBULATORY_CARE_PROVIDER_SITE_OTHER): Payer: Medicaid Other | Admitting: Obstetrics & Gynecology

## 2020-07-06 VITALS — BP 123/73 | HR 103 | Wt 236.0 lb

## 2020-07-06 DIAGNOSIS — Z3A31 31 weeks gestation of pregnancy: Secondary | ICD-10-CM

## 2020-07-06 DIAGNOSIS — O0993 Supervision of high risk pregnancy, unspecified, third trimester: Secondary | ICD-10-CM

## 2020-07-06 DIAGNOSIS — O30032 Twin pregnancy, monochorionic/diamniotic, second trimester: Secondary | ICD-10-CM

## 2020-07-06 DIAGNOSIS — Z1389 Encounter for screening for other disorder: Secondary | ICD-10-CM

## 2020-07-06 LAB — POCT URINALYSIS DIPSTICK OB
Blood, UA: NEGATIVE
Glucose, UA: NEGATIVE
Ketones, UA: NEGATIVE
Leukocytes, UA: NEGATIVE
Nitrite, UA: NEGATIVE
POC,PROTEIN,UA: NEGATIVE

## 2020-07-06 NOTE — Progress Notes (Signed)
HIGH-RISK PREGNANCY VISIT Patient name: Christina Conley MRN [id number]  Date of birth: Jan 06, 1996 Chief Complaint:   Routine Prenatal Visit, High Risk Gestation, and Non-stress Test  History of Present Illness:   Christina Conley is a 25 y.o. G66P1011 female at [redacted]w[redacted]d with an Estimated Date of Delivery: 09/07/20 being seen today for ongoing management of a high-risk pregnancy complicated by mono di twins.  Today she reports no complaints.  Depression screen [facility] 2/9 02/26/2020 01/14/2020 10/15/2012 10/15/2012  Decreased Interest 3 3 0 0  Down, Depressed, Hopeless 1 0 1 1  PHQ - 2 Score 4 3 1 1   Altered sleeping 3 3 2 2   Tired, decreased energy 3 3 1  -  Change in appetite 2 3 2  -  Feeling bad or failure about yourself  0 1 1 -  Trouble concentrating 0 0 0 -  Moving slowly or fidgety/restless 0 0 0 -  Suicidal thoughts 0 0 0 -  PHQ-9 Score 12 13 7 3     Contractions: Irritability. Vag. Bleeding: None.  Movement: Present. denies leaking of fluid.  Review of Systems:   Pertinent items are noted in HPI Denies abnormal vaginal discharge w/ itching/odor/irritation, headaches, visual changes, shortness of breath, chest pain, abdominal pain, severe nausea/vomiting, or problems with urination or bowel movements unless otherwise stated above. Pertinent History Reviewed:  Reviewed past medical,surgical, social, obstetrical and family history.  Reviewed problem list, medications and allergies. Physical Assessment:   Vitals:   07/06/20 1054  BP: 123/73  Pulse: (!) 103  Weight: 236 lb (107 kg)  Body mass index is 32.92 kg/m.           Physical Examination:   General appearance: alert, well appearing, and in no distress  Mental status: alert, oriented to person, place, and time  Skin: warm & dry   Extremities: Edema: Trace    Cardiovascular: normal heart rate noted  Respiratory: normal respiratory effort, no distress  Abdomen: gravid, soft, non-tender  Pelvic: Cervical exam deferred          Fetal Status:     Movement: Present    Fetal Surveillance Testing today: reactive NST x 2   Chaperone: N/A    Results for orders placed or performed in visit on 07/06/20 (from the past 24 hour(s))  POC Urinalysis Dipstick OB   Collection Time: 07/06/20 11:16 AM  Result Value Ref Range   Color, UA     Clarity, UA     Glucose, UA Negative Negative   Bilirubin, UA     Ketones, UA neg    Spec Grav, UA     Blood, UA neg    pH, UA     POC,PROTEIN,UA Negative Negative, Trace, Small (1+), Moderate (2+), Large (3+), 4+   Urobilinogen, UA     Nitrite, UA neg    Leukocytes, UA Negative Negative   Appearance     Odor      Assessment & Plan:  High-risk pregnancy: G3P1011 at [redacted]w[redacted]d with an Estimated Date of Delivery: 09/07/20   1) Mono Di twins, reassuring testing good growth of both twins no evidence of TTT    Meds: No orders of the defined types were placed in this encounter.   Labs/procedures today: NST  Treatment Plan:  Twice weekly surveillance, delivery 36-37 weeks  Reviewed: Preterm labor symptoms and general obstetric precautions including but not limited to vaginal bleeding, contractions, leaking of fluid and fetal movement were reviewed in detail with the patient.  All  questions were answered. Does have home bp cuff. Office bp cuff given: not applicable. Check bp weekly, let us know if consistently >140 and/or >90.  Follow-up: Return for keep scheduled.   Future Appointments  Date Time Provider Department Center  07/09/2020 10:30 AM [facility] NURSE [facility]  07/13/2020  1:30 PM [facility] - [facility] [place] [facility] None  07/13/2020  2:30 PM Myna Hidalgo, DO [facility]  07/21/2020  1:30 PM [facility] - [facility] [place] [facility] None  07/21/2020  2:30 PM Lazaro Arms, MD [facility]  07/28/2020  1:30 PM [facility] [place] [facility] None  07/28/2020  2:30 PM Myna Hidalgo, DO [facility]  08/04/2020  1:30 PM [facility] [place] [facility] None  08/04/2020  2:30 PM Lazaro Arms, MD  [facility]  08/11/2020  1:30 PM [facility] [place] [facility] None  08/11/2020  2:30 PM Cheral Marker, CNM [facility]  08/18/2020  1:30 PM [facility] - [facility] [place] [facility] None  08/18/2020  2:30 PM Lazaro Arms, MD [facility]  08/25/2020  1:30 PM [facility] [place] [facility] None  08/25/2020  2:30 PM Agripina Guyette, Amaryllis Dyke, MD [facility]    Orders Placed This Encounter  Procedures  . POC Urinalysis Dipstick OB   Lazaro Arms 07/06/2020 11:54 AM

## 2020-07-08 ENCOUNTER — Ambulatory Visit (INDEPENDENT_AMBULATORY_CARE_PROVIDER_SITE_OTHER): Payer: Medicaid Other | Admitting: Obstetrics and Gynecology

## 2020-07-08 ENCOUNTER — Encounter: Payer: Self-pay | Admitting: Obstetrics and Gynecology

## 2020-07-08 ENCOUNTER — Telehealth: Payer: Self-pay | Admitting: *Deleted

## 2020-07-08 ENCOUNTER — Other Ambulatory Visit: Payer: Self-pay

## 2020-07-08 VITALS — BP 118/74 | HR 110 | Wt 237.0 lb

## 2020-07-08 DIAGNOSIS — Z331 Pregnant state, incidental: Secondary | ICD-10-CM

## 2020-07-08 DIAGNOSIS — O0993 Supervision of high risk pregnancy, unspecified, third trimester: Secondary | ICD-10-CM | POA: Diagnosis not present

## 2020-07-08 DIAGNOSIS — O30033 Twin pregnancy, monochorionic/diamniotic, third trimester: Secondary | ICD-10-CM

## 2020-07-08 DIAGNOSIS — Z1389 Encounter for screening for other disorder: Secondary | ICD-10-CM

## 2020-07-08 LAB — POCT URINALYSIS DIPSTICK OB
Blood, UA: NEGATIVE
Glucose, UA: NEGATIVE
Leukocytes, UA: NEGATIVE
Nitrite, UA: NEGATIVE

## 2020-07-08 NOTE — Progress Notes (Signed)
Subjective:  Christina Conley is a 25 y.o. G3P1011 at [redacted]w[redacted]d being seen today for W/I d/t ut ctx for the last few hours. Denies vaginal bleeding or LOF. Last IC earlier this week.   She is currently monitored for the following issues for this high-risk pregnancy and has Supervision of high-risk pregnancy and Monochorionic diamniotic twin gestation on their problem list.  Patient reports See above.  Contractions: Regular.  .  Movement: Present. Denies leaking of fluid.   The following portions of the patient's history were reviewed and updated as appropriate: allergies, current medications, past family history, past medical history, past social history, past surgical history and problem list. Problem list updated.  Objective:   Vitals:   07/08/20 1542  BP: 118/74  Pulse: (!) 110  Weight: 237 lb (107.5 kg)    Fetal Status:     Movement: Present     General:  Alert, oriented and cooperative. Patient is in no acute distress.  Skin: Skin is warm and dry. No rash noted.   Cardiovascular: Normal heart rate noted  Respiratory: Normal respiratory effort, no problems with respiration noted  Abdomen: Soft, gravid, appropriate for gestational age. Pain/Pressure: Present     Pelvic:  Cervical exam performed        Extremities: Normal range of motion.  Edema: Mild pitting, slight indentation  Mental Status: Normal mood and affect. Normal behavior. Normal judgment and thought content.   Urinalysis:      Assessment and Plan:  Pregnancy: G3P1011 at [redacted]w[redacted]d  1. Supervision of high risk pregnancy in third trimester Stable Pt reassured no evidence of labor at present PTL precautions reviewed with pt.   2. Pregnant state, incidental  - POC Urinalysis Dipstick OB  3. Screening for genitourinary condition  - POC Urinalysis Dipstick OB  4. Monochorionic diamniotic twin gestation in third trimester See above  NST Twin A, 120's, + accels, no decels Twin B 130's, + accels, no decels No ut  ctx  Preterm labor symptoms and general obstetric precautions including but not limited to vaginal bleeding, contractions, leaking of fluid and fetal movement were reviewed in detail with the patient. Please refer to After Visit Summary for other counseling recommendations.  Return in about 2 weeks (around 07/22/2020) for OB visit, face to face, MD only.   Hermina Staggers, MD

## 2020-07-08 NOTE — Patient Instructions (Signed)

## 2020-07-08 NOTE — Telephone Encounter (Signed)
Patient thinks she may be having Contractions.Tish will follow up with Patient.

## 2020-07-08 NOTE — Telephone Encounter (Signed)
Patient states she has been having contractions for the last 2 hours contraction but is not hurting.  States she did not really hurt with her last labor until her water broke and at that time was 5 cm.  States she is feeling more vaginal pressure than normal.  Will have patient come in for labor eval.

## 2020-07-09 ENCOUNTER — Other Ambulatory Visit: Payer: Medicaid Other

## 2020-07-13 ENCOUNTER — Other Ambulatory Visit: Payer: Self-pay | Admitting: Obstetrics and Gynecology

## 2020-07-13 ENCOUNTER — Encounter: Payer: Medicaid Other | Admitting: Obstetrics & Gynecology

## 2020-07-13 ENCOUNTER — Other Ambulatory Visit: Payer: Medicaid Other

## 2020-07-13 DIAGNOSIS — O30033 Twin pregnancy, monochorionic/diamniotic, third trimester: Secondary | ICD-10-CM

## 2020-07-14 ENCOUNTER — Ambulatory Visit (INDEPENDENT_AMBULATORY_CARE_PROVIDER_SITE_OTHER): Payer: Medicaid Other | Admitting: Obstetrics & Gynecology

## 2020-07-14 ENCOUNTER — Other Ambulatory Visit: Payer: Self-pay

## 2020-07-14 ENCOUNTER — Encounter: Payer: Self-pay | Admitting: Obstetrics & Gynecology

## 2020-07-14 VITALS — BP 135/70 | HR 110 | Wt 238.8 lb

## 2020-07-14 DIAGNOSIS — O30033 Twin pregnancy, monochorionic/diamniotic, third trimester: Secondary | ICD-10-CM

## 2020-07-14 DIAGNOSIS — O0993 Supervision of high risk pregnancy, unspecified, third trimester: Secondary | ICD-10-CM | POA: Diagnosis not present

## 2020-07-14 DIAGNOSIS — Z1389 Encounter for screening for other disorder: Secondary | ICD-10-CM

## 2020-07-14 LAB — POCT URINALYSIS DIPSTICK OB
Blood, UA: NEGATIVE
Glucose, UA: NEGATIVE
Ketones, UA: NEGATIVE
Leukocytes, UA: NEGATIVE
Nitrite, UA: NEGATIVE
POC,PROTEIN,UA: NEGATIVE

## 2020-07-14 NOTE — Progress Notes (Signed)
HIGH-RISK PREGNANCY VISIT Patient name: Christina Conley MRN [id number]  Date of birth: 06-19-1995 Chief Complaint:   Routine Prenatal Visit, High Risk Gestation, and Non-stress Test  History of Present Illness:   Christina Conley is a 25 y.o. G49P1011 female at [redacted]w[redacted]d with an Estimated Date of Delivery: 09/07/20 being seen today for ongoing management of a high-risk pregnancy complicated by  -Mono/Di twins   Today she reports pelvic pressure as well as intermittent contractions. Last night noted some regular contractions that then resolved- today she has been mostly resting in bed- contractions have been irregular.  Contractions: Irritability. Vag. Bleeding: None.  Movement: Present. denies leaking of fluid.   Depression screen [facility] 2/9 02/26/2020 01/14/2020 10/15/2012 10/15/2012  Decreased Interest 3 3 0 0  Down, Depressed, Hopeless 1 0 1 1  PHQ - 2 Score 4 3 1 1   Altered sleeping 3 3 2 2   Tired, decreased energy 3 3 1  -  Change in appetite 2 3 2  -  Feeling bad or failure about yourself  0 1 1 -  Trouble concentrating 0 0 0 -  Moving slowly or fidgety/restless 0 0 0 -  Suicidal thoughts 0 0 0 -  PHQ-9 Score 12 13 7 3      Current Outpatient Medications  Medication Instructions  . acetaminophen (TYLENOL) 1,000 mg, Oral, Every 6 hours PRN  . aspirin 162 mg, Oral, Daily  . Blood Pressure Monitor MISC For regular home bp monitoring during pregnancy  . butalbital-acetaminophen-caffeine (FIORICET) 50-325-40 MG tablet 1-2 tablets, Oral, Every 6 hours PRN  . ferrous sulfate 325 mg, Oral, Every other day  . omeprazole (PRILOSEC) 20 mg, Oral, Daily, 1 tablet a day  . Prenatal Vit-Fe Fumarate-FA (PRENATAL VITAMIN PO) Oral     Review of Systems:   Pertinent items are noted in HPI Denies abnormal vaginal discharge w/ itching/odor/irritation, headaches, visual changes, shortness of breath, chest pain, abdominal pain, severe nausea/vomiting, or problems with urination or bowel movements unless  otherwise stated above. Pertinent History Reviewed:  Reviewed past medical,surgical, social, obstetrical and family history.  Reviewed problem list, medications and allergies. Physical Assessment:   Vitals:   07/14/20 1415  BP: 135/70  Pulse: (!) 110  Weight: 238 lb 12.8 oz (108.3 kg)  Body mass index is 33.31 kg/m.           Physical Examination:   General appearance: alert, well appearing, and in no distress  Mental status: alert, oriented to person, place, and time  Skin: warm & dry   Extremities: Edema: Mild pitting, slight indentation    Cardiovascular: normal heart rate noted  Respiratory: normal respiratory effort, no distress  Abdomen: gravid, soft, non-tender  Pelvic: Cervical exam performed         Fetal Status:     Movement: Present    Fetal Surveillance Testing today: NST x 2- difficulty with monitoring due to fetal movement and twin gestation.  Twin A: 130, moderate variability, no decels, +10x10 accels noted, Twin B: 140, moderate variability, no decels, accels not traced, but audible acceleration with movement noted.  Overall reassuring for current gestational age  Chaperone:    Results for orders placed or performed in visit on 07/14/20 (from the past 24 hour(s))  POC Urinalysis Dipstick OB   Collection Time: 07/14/20  2:31 PM  Result Value Ref Range   Color, UA     Clarity, UA     Glucose, UA Negative Negative   Bilirubin, UA  Ketones, UA neg    Spec Grav, UA     Blood, UA neg    pH, UA     POC,PROTEIN,UA Negative Negative, Trace, Small (1+), Moderate (2+), Large (3+), 4+   Urobilinogen, UA     Nitrite, UA neg    Leukocytes, UA Negative Negative   Appearance     Odor       Assessment & Plan:  High-risk pregnancy: G3P1011 at [redacted]w[redacted]d with an Estimated Date of Delivery: 09/07/20   1) Mono/Di twins -continue with BPP weekly- this week NST twice weekly- plan to return to BPP weekly due to difficulty with tracing FHT -plan for delivery @  36wk -last growth 4/25- AGA x2  Meds: No orders of the defined types were placed in this encounter.   Labs/procedures today: NST x2  Treatment Plan:  Continue with weekly testing, OB care as outlined above  Reviewed: Preterm labor symptoms and general obstetric precautions including but not limited to vaginal bleeding, contractions, leaking of fluid and fetal movement were reviewed in detail with the patient.  All questions were answered. Pt has home bp cuff. Check bp weekly, let us know if >140/90.   Follow-up: Return in about 1 week (around 07/21/2020) for as scheduled twice weekly testing.   Future Appointments  Date Time Provider Department Center  07/14/2020  2:50 PM Myna Hidalgo, DO [facility]  07/21/2020  1:30 PM [facility] [place] [facility] None  07/21/2020  2:30 PM Lazaro Arms, MD [facility]  07/28/2020  1:30 PM [facility] [place] [facility] None  07/28/2020  2:30 PM Myna Hidalgo, DO [facility]  08/04/2020  1:30 PM [facility] [place] [facility] None  08/04/2020  2:30 PM Lazaro Arms, MD [facility]  08/11/2020  1:30 PM [facility] [place] [facility] None  08/11/2020  2:30 PM Cheral Marker, CNM [facility]  08/18/2020  1:30 PM [facility] [place] [facility] None  08/18/2020  2:30 PM Lazaro Arms, MD [facility]  08/25/2020  1:30 PM [facility] [place] [facility] None  08/25/2020  2:30 PM Eure, Amaryllis Dyke, MD [facility]    Orders Placed This Encounter  Procedures  . POC Urinalysis Dipstick OB    Myna Hidalgo, DO Attending Obstetrician & Gynecologist, [facility] for [employer], [facility]

## 2020-07-17 ENCOUNTER — Other Ambulatory Visit: Payer: Self-pay

## 2020-07-17 ENCOUNTER — Ambulatory Visit (INDEPENDENT_AMBULATORY_CARE_PROVIDER_SITE_OTHER): Payer: Medicaid Other | Admitting: *Deleted

## 2020-07-17 DIAGNOSIS — Z3A32 32 weeks gestation of pregnancy: Secondary | ICD-10-CM

## 2020-07-17 DIAGNOSIS — O30033 Twin pregnancy, monochorionic/diamniotic, third trimester: Secondary | ICD-10-CM | POA: Diagnosis not present

## 2020-07-17 DIAGNOSIS — O0993 Supervision of high risk pregnancy, unspecified, third trimester: Secondary | ICD-10-CM

## 2020-07-17 NOTE — Progress Notes (Addendum)
   NURSE VISIT- NST  SUBJECTIVE:  Christina Conley is a 25 y.o. G62P1011 female at [redacted]w[redacted]d, here for a NST for pregnancy complicated by Multiple gestation.  She reports active fetal movement, contractions: none, vaginal bleeding: none, membranes: intact.   OBJECTIVE:  BP 118/71   Wt 240 lb (108.9 kg)   LMP 12/08/2019   BMI 33.47 kg/m   Appears well, no apparent distress  No results found for this or any previous visit (from the past 24 hour(s)).  NST: FHR A baseline 140 bpm, Variability: moderate, Accelerations:present, Decelerations:  Absent= Cat 1/reactive  FHR B baseline 145, variability:moderate,Accels:present,decels:absent=Cat1/  reactive Toco: none   ASSESSMENT: G3P1011 at [redacted]w[redacted]d with Multiple gestation NST reactive  PLAN: EFM strip reviewed by [name], CNM, [facility]   Recommendations: keep next appointment as scheduled    Jobe Marker  07/17/2020 12:22 PM  Chart reviewed for nurse visit. Agree with plan of care.  Cheral Marker, [place] 07/17/2020 2:16 PM

## 2020-07-21 ENCOUNTER — Other Ambulatory Visit: Payer: Self-pay

## 2020-07-21 ENCOUNTER — Encounter: Payer: Self-pay | Admitting: Obstetrics & Gynecology

## 2020-07-21 ENCOUNTER — Ambulatory Visit (INDEPENDENT_AMBULATORY_CARE_PROVIDER_SITE_OTHER): Payer: Medicaid Other

## 2020-07-21 ENCOUNTER — Ambulatory Visit (INDEPENDENT_AMBULATORY_CARE_PROVIDER_SITE_OTHER): Payer: Medicaid Other | Admitting: Obstetrics & Gynecology

## 2020-07-21 VITALS — BP 113/72 | HR 104 | Wt 239.0 lb

## 2020-07-21 DIAGNOSIS — Z3A33 33 weeks gestation of pregnancy: Secondary | ICD-10-CM

## 2020-07-21 DIAGNOSIS — O0993 Supervision of high risk pregnancy, unspecified, third trimester: Secondary | ICD-10-CM

## 2020-07-21 DIAGNOSIS — O30033 Twin pregnancy, monochorionic/diamniotic, third trimester: Secondary | ICD-10-CM

## 2020-07-21 NOTE — Progress Notes (Signed)
[place] 33+1 wks,MO/DI TWINS:  BABY A: cephalic,anterior placenta gr 3,BPP 8/8,EFW 2290 g 63%,svp of fluid 4.9 cm,discordance 2.4% BABY B: superior transverse head left,anterior placenta gr 3,BPP 8/8,efw 2236 g 56%,svp of fluid 4.4 cm

## 2020-07-21 NOTE — Progress Notes (Signed)
HIGH-RISK PREGNANCY VISIT Patient name: Christina Conley MRN [id number]  Date of birth: May 08, 1995 Chief Complaint:   High Risk Gestation ([place] today)  History of Present Illness:   Christina Conley is a 25 y.o. G38P1011 female at [redacted]w[redacted]d with an Estimated Date of Delivery: 09/07/20 being seen today for ongoing management of a high-risk pregnancy complicated by Mono/Di twins.    Today she reports nothing in particular just kinda getting miserable. Contractions: Irregular. Vag. Bleeding: None.  Movement: Present. denies leaking of fluid.   Depression screen [facility] 2/9 02/26/2020 01/14/2020 10/15/2012 10/15/2012  Decreased Interest 3 3 0 0  Down, Depressed, Hopeless 1 0 1 1  PHQ - 2 Score 4 3 1 1   Altered sleeping 3 3 2 2   Tired, decreased energy 3 3 1  -  Change in appetite 2 3 2  -  Feeling bad or failure about yourself  0 1 1 -  Trouble concentrating 0 0 0 -  Moving slowly or fidgety/restless 0 0 0 -  Suicidal thoughts 0 0 0 -  PHQ-9 Score 12 13 7 3      GAD 7 : Generalized Anxiety Score 02/26/2020 01/14/2020  Nervous, Anxious, on Edge 1 2  Control/stop worrying 2 1  Worry too much - different things 2 3  Trouble relaxing 3 1  Restless 0 0  Easily annoyed or irritable 2 3  Afraid - awful might happen 0 2  Total GAD 7 Score 10 12     Review of Systems:   Pertinent items are noted in HPI Denies abnormal vaginal discharge w/ itching/odor/irritation, headaches, visual changes, shortness of breath, chest pain, abdominal pain, severe nausea/vomiting, or problems with urination or bowel movements unless otherwise stated above. Pertinent History Reviewed:  Reviewed past medical,surgical, social, obstetrical and family history.  Reviewed problem list, medications and allergies. Physical Assessment:   Vitals:   07/21/20 1437  BP: 113/72  Pulse: (!) 104  Weight: 239 lb (108.4 kg)  Body mass index is 33.33 kg/m.           Physical Examination:   General appearance: alert, well appearing, and in  no distress  Mental status: alert, oriented to person, place, and time  Skin: warm & dry   Extremities: Edema: Trace    Cardiovascular: normal heart rate noted  Respiratory: normal respiratory effort, no distress  Abdomen: gravid, soft, non-tender  Pelvic: Cervical exam deferred         Fetal Status:     Movement: Present    Fetal Surveillance Testing today: sonogram normal   Chaperone: N/A    No results found for this or any previous visit (from the past 24 hour(s)).  Assessment & Plan:  High-risk pregnancy: G3P1011 at [redacted]w[redacted]d with an Estimated Date of Delivery: 09/07/20   1) Mono Di twins, no evidence of TTT, terrific growth, no discordance good fluid twin A is vertex, stable    Meds: No orders of the defined types were placed in this encounter.   Labs/procedures today: U/S  Treatment Plan:  Weekly surveillance scans  Reviewed: Preterm labor symptoms and general obstetric precautions including but not limited to vaginal bleeding, contractions, leaking of fluid and fetal movement were reviewed in detail with the patient.  All questions were answered. Does have home bp cuff. Office bp cuff given: not applicable. Check bp daily, let 04/25/2020 know if consistently >140 and/or >90.  Follow-up: No follow-ups on file.   Future Appointments  Date Time Provider Department Center  07/28/2020  1:30 PM [facility] [place] [facility] None  07/28/2020  2:30 PM Myna Hidalgo, DO [facility]  08/04/2020  1:30 PM [facility] [place] [facility] None  08/04/2020  2:30 PM Lazaro Arms, MD [facility]  08/11/2020  1:30 PM [facility] [place] [facility] None  08/11/2020  2:30 PM Cheral Marker, CNM [facility]  08/18/2020  1:30 PM [facility] [place] [facility] None  08/18/2020  2:30 PM Lazaro Arms, MD [facility]  08/25/2020  1:30 PM [facility] [place] [facility] None  08/25/2020  2:30 PM Lazaro Arms, MD [facility]    No orders of the defined types were placed in this encounter.  Lazaro Arms   07/21/2020 3:21 PM

## 2020-07-25 ENCOUNTER — Other Ambulatory Visit: Payer: Self-pay

## 2020-07-25 ENCOUNTER — Inpatient Hospital Stay (HOSPITAL_COMMUNITY)
Admission: AD | Admit: 2020-07-25 | Discharge: 2020-07-26 | Disposition: A | Discharge status: Home / Self Care | Payer: Medicaid Other | Attending: Obstetrics and Gynecology | Admitting: Obstetrics and Gynecology

## 2020-07-25 ENCOUNTER — Encounter (HOSPITAL_COMMUNITY): Payer: Self-pay | Admitting: Obstetrics and Gynecology

## 2020-07-25 DIAGNOSIS — R197 Diarrhea, unspecified: Secondary | ICD-10-CM | POA: Diagnosis not present

## 2020-07-25 DIAGNOSIS — Z7982 Long term (current) use of aspirin: Secondary | ICD-10-CM | POA: Insufficient documentation

## 2020-07-25 DIAGNOSIS — Z880 Allergy status to penicillin: Secondary | ICD-10-CM | POA: Insufficient documentation

## 2020-07-25 DIAGNOSIS — O0993 Supervision of high risk pregnancy, unspecified, third trimester: Secondary | ICD-10-CM | POA: Diagnosis not present

## 2020-07-25 DIAGNOSIS — O30033 Twin pregnancy, monochorionic/diamniotic, third trimester: Secondary | ICD-10-CM

## 2020-07-25 DIAGNOSIS — Z3A33 33 weeks gestation of pregnancy: Secondary | ICD-10-CM | POA: Diagnosis not present

## 2020-07-25 DIAGNOSIS — O99891 Other specified diseases and conditions complicating pregnancy: Secondary | ICD-10-CM | POA: Insufficient documentation

## 2020-07-25 DIAGNOSIS — O212 Late vomiting of pregnancy: Secondary | ICD-10-CM | POA: Insufficient documentation

## 2020-07-25 DIAGNOSIS — Z87891 Personal history of nicotine dependence: Secondary | ICD-10-CM | POA: Insufficient documentation

## 2020-07-25 DIAGNOSIS — Z79899 Other long term (current) drug therapy: Secondary | ICD-10-CM | POA: Diagnosis not present

## 2020-07-25 DIAGNOSIS — O219 Vomiting of pregnancy, unspecified: Secondary | ICD-10-CM

## 2020-07-25 LAB — URINALYSIS, ROUTINE W REFLEX MICROSCOPIC
Bilirubin Urine: NEGATIVE
Glucose, UA: NEGATIVE mg/dL
Hgb urine dipstick: NEGATIVE
Ketones, ur: NEGATIVE mg/dL
Leukocytes,Ua: NEGATIVE
Nitrite: NEGATIVE
Protein, ur: NEGATIVE mg/dL
Specific Gravity, Urine: 1.015 (ref 1.005–1.030)
pH: 6.5 (ref 5.0–8.0)

## 2020-07-25 MED ORDER — LACTATED RINGERS IV BOLUS
1000.0000 mL | Freq: Once | INTRAVENOUS | Status: AC
  Administered 2020-07-25: 1000 mL via INTRAVENOUS

## 2020-07-25 NOTE — MAU Note (Signed)
Pt reports she has felt really weak since Wednesday. Decreased appetite, loose stools for the last few days (reports she has been constipated the whole pregnancy), vomiting

## 2020-07-25 NOTE — MAU Provider Note (Signed)
History     CSN: [id number]  Arrival date and time: 07/25/20 2123   Event Date/Time   First Provider Initiated Contact with Patient 07/25/20 2203      Chief Complaint  Patient presents with  . Diarrhea        . Emesis   Christina Conley is a 25 y.o. G3P1011 at [redacted]w[redacted]d who presents today with vomiting, soft stools, and a general sense of feeling unwell. She denies any contractions, VB or LOF. She reports normal fetal movement. She has had some contractions "for a while now", but no change in contractions that she is feeling today. She reports that she was checked a couple of weeks ago and cervix was closed.   Diarrhea  This is a new problem. The current episode started yesterday. The problem occurs 2 to 4 times per day. The problem has been unchanged. Associated symptoms include vomiting. Nothing aggravates the symptoms. She has tried nothing for the symptoms.  Emesis  This is a new problem. The current episode started today. The problem occurs less than 2 times per day. The problem has been unchanged. The emesis has an appearance of stomach contents. There has been no fever. Associated symptoms include diarrhea. Risk factors: pregnancy  She has tried nothing for the symptoms. The treatment provided no relief.    OB History    Gravida  3   Para  1   Term  1   Preterm      AB  1   Living  1     SAB  1   IAB      Ectopic      Multiple  0   Live Births  1           Past Medical History:  Diagnosis Date  . Asthma    child.  No meds > 2 years  . BV (bacterial vaginosis) 07/02/2012  . Contraceptive management 05/27/2015  . Environmental allergies   . Gonorrhea 08/2015  . Irregular intermenstrual bleeding 05/27/2015  . LLQ pain 05/27/2015  . Round ligament pain 12/09/2013  . Vaginal discharge 12/09/2013    Past Surgical History:  Procedure Laterality Date  . NO PAST SURGERIES      Family History  Problem Relation Age of Onset  . Hypertension Maternal  Grandfather   . Hyperlipidemia Maternal Grandfather   . Crohn's disease Mother   . Other Father        problems with intestines  . Crohn's disease Paternal Grandmother   . Hypertension Maternal Grandmother   . Miscarriages / Stillbirths Maternal Grandmother     Social History   Tobacco Use  . Smoking status: Former Smoker    Packs/day: 0.25    Years: 0.50    Pack years: 0.12    Types: Cigarettes  . Smokeless tobacco: Never Used  Vaping Use  . Vaping Use: Never used  Substance Use Topics  . Alcohol use: No  . Drug use: No    Types: Marijuana    Comment: quit 2015    Allergies:  Allergies  Allergen Reactions  . Amoxicillin Hives  . Ampicillin Hives  . Penicillins Hives    Has patient had a PCN reaction causing immediate rash, facial/tongue/throat swelling, SOB or lightheadedness with hypotension: Yes Has patient had a PCN reaction causing severe rash involving mucus membranes or skin necrosis: No Has patient had a PCN reaction that required hospitalization No Has patient had a PCN reaction occurring within the last 10  years: unknown If all of the above answers are "NO", then may proceed with Cephalosporin use.     Medications Prior to Admission  Medication Sig Dispense Refill Last Dose  . aspirin 81 MG chewable tablet Chew 2 tablets (162 mg total) by mouth daily. 60 tablet 7 07/25/2020 at Unknown time  . ferrous sulfate 325 (65 FE) MG tablet Take 1 tablet (325 mg total) by mouth every other day. 15 tablet 3 07/25/2020 at Unknown time  . Prenatal Vit-Fe Fumarate-FA (PRENATAL VITAMIN PO) Take by mouth.   07/25/2020 at Unknown time  . acetaminophen (TYLENOL) 500 MG tablet Take 1,000 mg by mouth every 6 (six) hours as needed.     . Blood Pressure Monitor MISC For regular home bp monitoring during pregnancy 1 each 0   . butalbital-acetaminophen-caffeine (FIORICET) 50-325-40 MG tablet Take 1-2 tablets by mouth every 6 (six) hours as needed for headache. 20 tablet 0   . omeprazole  (PRILOSEC) 20 MG capsule Take 1 capsule (20 mg total) by mouth daily. 1 tablet a day 30 capsule 6     Review of Systems  Gastrointestinal: Positive for diarrhea and vomiting.   Physical Exam   Blood pressure 119/69, pulse (!) 105, temperature 98.5 F (36.9 C), temperature source Oral, resp. rate 17, height 5\' 11"  (1.803 m), weight 108 kg, last menstrual period 12/08/2019, SpO2 100 %.  Physical Exam Vitals and nursing note reviewed.  Constitutional:      General: She is not in acute distress. HENT:     Head: Normocephalic.  Eyes:     Pupils: Pupils are equal, round, and reactive to light.  Cardiovascular:     Rate and Rhythm: Normal rate.  Pulmonary:     Effort: Pulmonary effort is normal.  Abdominal:     Palpations: Abdomen is soft.     Tenderness: There is no abdominal tenderness.  Genitourinary:    Comments:  Dilation: Closed Cervical Position: Posterior Station: -3 Exam by:: H.Destyni Hoppel,CNM  Skin:    General: Skin is warm and dry.  Neurological:     Mental Status: She is alert and oriented to person, place, and time.  Psychiatric:        Mood and Affect: Mood normal.        Behavior: Behavior normal.      NST: Baby A Baseline: 135 Variability: moderate Accels: 15x15 Decels: none Toco: irregular Reactive/Appropriate for GA  NST:  Baseline: 140 Variability: moderate Accels: 15x15 Decels: none Toco: irregular  Reactive/Appropriate for GA  Results for orders placed or performed during the hospital encounter of 07/25/20 (from the past 24 hour(s))  Urinalysis, Routine w reflex microscopic Urine, Clean Catch     Status: None   Collection Time: 07/25/20  9:31 PM  Result Value Ref Range   Color, Urine YELLOW YELLOW   APPearance CLEAR CLEAR   Specific Gravity, Urine 1.015 1.005 - 1.030   pH 6.5 5.0 - 8.0   Glucose, UA NEGATIVE NEGATIVE mg/dL   Hgb urine dipstick NEGATIVE NEGATIVE   Bilirubin Urine NEGATIVE NEGATIVE   Ketones, ur NEGATIVE NEGATIVE mg/dL    Protein, ur NEGATIVE NEGATIVE mg/dL   Nitrite NEGATIVE NEGATIVE   Leukocytes,Ua NEGATIVE NEGATIVE     MAU Course  Procedures  MDM Patient give 1L LR bolus and reports feeling better after this.  Will DC home with an antiemetic for patient to have if she feels she needs it once she is home.   Assessment and Plan   1. Monochorionic diamniotic  twin gestation in third trimester   2. [redacted] weeks gestation of pregnancy   3. Nausea/vomiting in pregnancy   4. Supervision of high risk pregnancy in third trimester    DC home Comfort measures reviewed  3rd Trimester precautions  PTL precautions  Fetal kick counts RX: zofran PRN #20  Return to MAU as needed FU with OB as planned   Follow-up Information    [facility] Follow up.   Specialty: Obstetrics and Gynecology Contact information: [address] [place] [address] [phone]             Thressa Sheller DNP, CNM  07/26/20  12:02 AM

## 2020-07-26 MED ORDER — ONDANSETRON 8 MG PO TBDP: 8.0000 mg | ORAL_TABLET | Freq: Three times a day (TID) | ORAL | 0 refills | Status: DC | PRN

## 2020-07-26 NOTE — Discharge Instructions (Signed)

## 2020-07-27 ENCOUNTER — Other Ambulatory Visit: Payer: Self-pay | Admitting: Obstetrics & Gynecology

## 2020-07-27 DIAGNOSIS — O30033 Twin pregnancy, monochorionic/diamniotic, third trimester: Secondary | ICD-10-CM

## 2020-07-28 ENCOUNTER — Ambulatory Visit (INDEPENDENT_AMBULATORY_CARE_PROVIDER_SITE_OTHER): Payer: Medicaid Other | Admitting: Obstetrics & Gynecology

## 2020-07-28 ENCOUNTER — Inpatient Hospital Stay (HOSPITAL_COMMUNITY)
Admission: AD | Admit: 2020-07-28 | Discharge: 2020-07-28 | Disposition: A | Discharge status: Home / Self Care | Payer: Medicaid Other | Attending: Obstetrics and Gynecology | Admitting: Obstetrics and Gynecology

## 2020-07-28 ENCOUNTER — Ambulatory Visit (INDEPENDENT_AMBULATORY_CARE_PROVIDER_SITE_OTHER): Payer: Medicaid Other

## 2020-07-28 ENCOUNTER — Other Ambulatory Visit: Payer: Self-pay

## 2020-07-28 ENCOUNTER — Encounter: Payer: Self-pay | Admitting: Obstetrics & Gynecology

## 2020-07-28 ENCOUNTER — Encounter (HOSPITAL_COMMUNITY): Payer: Self-pay | Admitting: Obstetrics and Gynecology

## 2020-07-28 VITALS — BP 125/83 | HR 93 | Wt 238.2 lb

## 2020-07-28 DIAGNOSIS — O30033 Twin pregnancy, monochorionic/diamniotic, third trimester: Secondary | ICD-10-CM | POA: Diagnosis not present

## 2020-07-28 DIAGNOSIS — Z880 Allergy status to penicillin: Secondary | ICD-10-CM | POA: Insufficient documentation

## 2020-07-28 DIAGNOSIS — O0993 Supervision of high risk pregnancy, unspecified, third trimester: Secondary | ICD-10-CM | POA: Diagnosis not present

## 2020-07-28 DIAGNOSIS — Z79899 Other long term (current) drug therapy: Secondary | ICD-10-CM | POA: Insufficient documentation

## 2020-07-28 DIAGNOSIS — Z7982 Long term (current) use of aspirin: Secondary | ICD-10-CM | POA: Diagnosis not present

## 2020-07-28 DIAGNOSIS — Z3A34 34 weeks gestation of pregnancy: Secondary | ICD-10-CM

## 2020-07-28 DIAGNOSIS — Z87891 Personal history of nicotine dependence: Secondary | ICD-10-CM | POA: Diagnosis not present

## 2020-07-28 DIAGNOSIS — Z1389 Encounter for screening for other disorder: Secondary | ICD-10-CM

## 2020-07-28 DIAGNOSIS — Z9289 Personal history of other medical treatment: Secondary | ICD-10-CM

## 2020-07-28 LAB — POCT URINALYSIS DIPSTICK OB
Blood, UA: NEGATIVE
Glucose, UA: NEGATIVE
Leukocytes, UA: NEGATIVE
Nitrite, UA: NEGATIVE
POC,PROTEIN,UA: NEGATIVE

## 2020-07-28 LAB — URINALYSIS, ROUTINE W REFLEX MICROSCOPIC
Bilirubin Urine: NEGATIVE
Glucose, UA: NEGATIVE mg/dL
Hgb urine dipstick: NEGATIVE
Ketones, ur: 20 mg/dL — AB
Leukocytes,Ua: NEGATIVE
Nitrite: NEGATIVE
Protein, ur: NEGATIVE mg/dL
Specific Gravity, Urine: 1.012 (ref 1.005–1.030)
pH: 6 (ref 5.0–8.0)

## 2020-07-28 MED ORDER — LACTATED RINGERS IV BOLUS: 1000.0000 mL | Freq: Once | INTRAVENOUS | Status: DC

## 2020-07-28 NOTE — MAU Note (Signed)
RN attempted to start IV to complete LR fluid bolus order, pt ref IV start at this time, states that it is "too painful" and she wants to drink PO fluids instead.  CNM made aware. RN will continue to monitor

## 2020-07-28 NOTE — MAU Note (Signed)
Baby A wasn't doing practive breathing.   Sent in for monitoring for a few hours.  Sat night was here for "weakness". Was contracting then, have continued and gotten stronger.  Is now uncomfortable and tight. Was closed on Sat.

## 2020-07-28 NOTE — Progress Notes (Signed)
HIGH-RISK PREGNANCY VISIT Patient name: Christina Conley MRN [id number]  Date of birth: 1995-05-23 Chief Complaint:   Routine Prenatal Visit, High Risk Gestation, and Pregnancy Ultrasound  History of Present Illness:   Christina Conley is a 25 y.o. G8P1011 female at [redacted]w[redacted]d with an Estimated Date of Delivery: 09/07/20 being seen today for ongoing management of a high-risk pregnancy complicated by mono/di twins.    Today she reports occasional contractions.   Contractions: Irritability. Vag. Bleeding: None.  Movement: Present. denies leaking of fluid.   Depression screen [facility] 2 2/9 02/26/2020 01/14/2020 10/15/2012 10/15/2012  Decreased Interest 3 3 0 0  Down, Depressed, Hopeless 1 0 1 1  PHQ - 2 Score 4 3 1 1   Altered sleeping 3 3 2 2   Tired, decreased energy 3 3 1  -  Change in appetite 2 3 2  -  Feeling bad or failure about yourself  0 1 1 -  Trouble concentrating 0 0 0 -  Moving slowly or fidgety/restless 0 0 0 -  Suicidal thoughts 0 0 0 -  PHQ-9 Score 12 13 7 3      Current Outpatient Medications  Medication Instructions  . acetaminophen (TYLENOL) 1,000 mg, Oral, Every 6 hours PRN  . aspirin 162 mg, Oral, Daily  . Blood Pressure Monitor MISC For regular home bp monitoring during pregnancy  . butalbital-acetaminophen-caffeine (FIORICET) 50-325-40 MG tablet 1-2 tablets, Oral, Every 6 hours PRN  . ferrous sulfate 325 mg, Oral, Every other day  . omeprazole (PRILOSEC) 20 mg, Oral, Daily, 1 tablet a day  . ondansetron (ZOFRAN ODT) 8 mg, Oral, Every 8 hours PRN  . Prenatal Vit-Fe Fumarate-FA (PRENATAL VITAMIN PO) Oral     Review of Systems:   Pertinent items are noted in HPI Denies abnormal vaginal discharge w/ itching/odor/irritation, headaches, visual changes, shortness of breath, chest pain, abdominal pain, severe nausea/vomiting, or problems with urination or bowel movements unless otherwise stated above. Pertinent History Reviewed:  Reviewed past medical,surgical, social, obstetrical  and family history.  Reviewed problem list, medications and allergies. Physical Assessment:   Vitals:   07/28/20 1438  BP: 125/83  Pulse: 93  Weight: 238 lb 3.2 oz (108 kg)  Body mass index is 33.22 kg/m.           Physical Examination:   General appearance: alert, well appearing, and in no distress  Mental status: alert, oriented to person, place, and time  Skin: warm & dry   Extremities: Edema: Trace    Cardiovascular: normal heart rate noted  Respiratory: normal respiratory effort, no distress  Abdomen: gravid, soft, non-tender  Pelvic: Cervical exam deferred         Fetal Status:     Movement: Present      Fetal Surveillance Testing today: BPP  BABY A:cephalic right,BPP 6/8 no breathing,fhr 138 bpm,SVP of fluid 4.2 cm,anterior placenta gr 3 BABY B:cephalic left,BPP 8/8,SVP of fluid 4.2 cm,fhr 148 bpm,anterior placenta   Chaperone: N/A    Results for orders placed or performed in visit on 07/28/20 (from the past 24 hour(s))  POC Urinalysis Dipstick OB   Collection Time: 07/28/20  2:41 PM  Result Value Ref Range   Color, UA     Clarity, UA     Glucose, UA Negative Negative   Bilirubin, UA     Ketones, UA mod    Spec Grav, UA     Blood, UA neg    pH, UA     POC,PROTEIN,UA Negative Negative, Trace, Small (1+), Moderate (  2+), Large (3+), 4+   Urobilinogen, UA     Nitrite, UA neg    Leukocytes, UA Negative Negative   Appearance     Odor       Assessment & Plan:  High-risk pregnancy: G3P1011 at [redacted]w[redacted]d with an Estimated Date of Delivery: 09/07/20   1) Mono/Di twins -BPP today 6/8 for Twin A, 8/8 Twin B -sent to MAU for prolonged monitoring and repeat BPP   Meds: No orders of the defined types were placed in this encounter.   Labs/procedures today: BPP  Treatment Plan:  Next step in management pending fetal monitoring  Reviewed: Preterm labor symptoms and general obstetric precautions including but not limited to vaginal bleeding, contractions, leaking of  fluid and fetal movement were reviewed in detail with the patient.  All questions were answered.   Follow-up: IF repeat BPP reassuring, pt will follow up in one week for next round of appointments  Future Appointments  Date Time Provider Department Center  08/04/2020  1:30 PM [facility] [place] [facility] None  08/04/2020  2:30 PM Lazaro Arms, MD [facility]  08/11/2020  1:30 PM [facility] [place] [facility] None  08/11/2020  2:30 PM Cheral Marker, CNM [facility]  08/18/2020  1:30 PM [facility] [place] [facility] None  08/18/2020  2:30 PM Lazaro Arms, MD [facility]  08/25/2020  1:30 PM [facility] [place] [facility] None  08/25/2020  2:30 PM Eure, Amaryllis Dyke, MD [facility]    Orders Placed This Encounter  Procedures  . POC Urinalysis Dipstick OB    Myna Hidalgo, DO Attending Obstetrician & Gynecologist, [facility] for [employer], [facility]

## 2020-07-28 NOTE — Discharge Instructions (Signed)

## 2020-07-28 NOTE — MAU Note (Signed)
Pt not transported to u/s, order for repeat BPP not indicated per Dr. Grace Bushy, Dr. Donavan Foil.  RN confirmed with Wynelle Bourgeois, CNM.  RN will discharge patient per order.

## 2020-07-28 NOTE — Progress Notes (Signed)
[place] 34+1 wks,MO/DI twins BABY A:cephalic right,BPP 6/8 no breathing,fhr 138 bpm,SVP of fluid 4.2 cm,anterior placenta gr 3 BABY B:cephalic left,BPP 8/8,SVP of fluid 4.2 cm,fhr 148 bpm,anterior placenta

## 2020-07-28 NOTE — MAU Provider Note (Addendum)
History     CSN: [id number]  Arrival date and time: 07/28/20 1634   Event Date/Time   First Provider Initiated Contact with Patient 07/28/20 1807      Chief Complaint  Patient presents with  . Contractions   Ms. Christina Conley is a 25 y.o. year old G23P1011 female at [redacted]w[redacted]d weeks gestation who was sent to [facility]U from [facility] for abnormal BPP of Twin A. She had "lots of contractions on the monitor," so she also needs prolonged monitoring. She was also seen in MAU for diarrhea and vomiting on Saturday 07/25/20; her cervix was closed. Dr. Charlotta Newton saw her in the office today and deferred cervical exam until MAU visit today. Dr. Charlotta Newton wanted her to have prolonged monitoring and a repeat BPP. She receives [facility] with [facility].   OB History    Gravida  3   Para  1   Term  1   Preterm      AB  1   Living  1     SAB  1   IAB      Ectopic      Multiple  0   Live Births  1           Past Medical History:  Diagnosis Date  . Asthma    child.  No meds > 2 years  . BV (bacterial vaginosis) 07/02/2012  . Contraceptive management 05/27/2015  . Environmental allergies   . Gonorrhea 08/2015  . Irregular intermenstrual bleeding 05/27/2015  . LLQ pain 05/27/2015  . Round ligament pain 12/09/2013  . Vaginal discharge 12/09/2013    Past Surgical History:  Procedure Laterality Date  . NO PAST SURGERIES      Family History  Problem Relation Age of Onset  . Hypertension Maternal Grandfather   . Hyperlipidemia Maternal Grandfather   . Crohn's disease Mother   . Other Father        problems with intestines  . Crohn's disease Paternal Grandmother   . Hypertension Maternal Grandmother   . Miscarriages / Stillbirths Maternal Grandmother     Social History   Tobacco Use  . Smoking status: Former Smoker    Packs/day: 0.25    Years: 0.50    Pack years: 0.12    Types: Cigarettes  . Smokeless tobacco: Never Used  Vaping Use  . Vaping Use: Never used  Substance Use Topics  . Alcohol use:  No  . Drug use: No    Types: Marijuana    Comment: quit 2015    Allergies:  Allergies  Allergen Reactions  . Amoxicillin Hives  . Ampicillin Hives  . Penicillins Hives    Has patient had a PCN reaction causing immediate rash, facial/tongue/throat swelling, SOB or lightheadedness with hypotension: Yes Has patient had a PCN reaction causing severe rash involving mucus membranes or skin necrosis: No Has patient had a PCN reaction that required hospitalization No Has patient had a PCN reaction occurring within the last 10 years: unknown If all of the above answers are "NO", then may proceed with Cephalosporin use.     Medications Prior to Admission  Medication Sig Dispense Refill Last Dose  . acetaminophen (TYLENOL) 500 MG tablet Take 1,000 mg by mouth every 6 (six) hours as needed.     Marland Kitchen aspirin 81 MG chewable tablet Chew 2 tablets (162 mg total) by mouth daily. 60 tablet 7   . Blood Pressure Monitor MISC For regular home bp monitoring during pregnancy 1 each 0   .  butalbital-acetaminophen-caffeine (FIORICET) 50-325-40 MG tablet Take 1-2 tablets by mouth every 6 (six) hours as needed for headache. 20 tablet 0   . ferrous sulfate 325 (65 FE) MG tablet Take 1 tablet (325 mg total) by mouth every other day. 15 tablet 3   . omeprazole (PRILOSEC) 20 MG capsule Take 1 capsule (20 mg total) by mouth daily. 1 tablet a day 30 capsule 6   . ondansetron (ZOFRAN ODT) 8 MG disintegrating tablet Take 1 tablet (8 mg total) by mouth every 8 (eight) hours as needed for nausea or vomiting. 20 tablet 0   . Prenatal Vit-Fe Fumarate-FA (PRENATAL VITAMIN PO) Take by mouth.       Review of Systems  Constitutional: Negative.   HENT: Negative.   Eyes: Negative.   Respiratory: Negative.   Cardiovascular: Negative.   Gastrointestinal: Negative.   Endocrine: Negative.   Genitourinary:       UC's every 4-6 mins  Musculoskeletal: Negative.   Skin: Negative.   Allergic/Immunologic: Negative.    Neurological: Negative.   Hematological: Negative.   Psychiatric/Behavioral: Negative.    Physical Exam   Patient Vitals for the past 24 hrs:  BP Temp Temp src Pulse Resp SpO2 Height Weight  07/28/20 1738 118/76 -- -- (!) 145 -- 100 % -- --  07/28/20 1715 114/70 97.9 F (36.6 C) Oral (!) 120 18 99 % 5\' 11"  (1.803 m) 108.5 kg    Physical Exam Vitals and nursing note reviewed. Exam conducted with a chaperone present.  Constitutional:      Appearance: Normal appearance. She is obese.  Genitourinary:    General: Normal vulva.     Comments: Dilation: 1 Effacement (%): Thick Cervical Position: Posterior Presentation: Undeterminable Exam by: , CNM  Musculoskeletal:        General: Normal range of motion.  Skin:    General: Skin is warm and dry.  Neurological:     Mental Status: She is alert and oriented to person, place, and time.  Psychiatric:        Mood and Affect: Mood normal.        Behavior: Behavior normal.        Thought Content: Thought content normal.        Judgment: Judgment normal.    REACTIVE NST - FHR (A): 135 bpm / moderate variability / accels present / decels absent / FHR (B): 140 bpm / moderate variability / accels present / decels absent / TOCO: irregular UC's MAU Course  Procedures  MDM [facility] EKG LR Bolus 1000 ml @ 999 ml/hr  *Consult with Dr. Raelyn Mora @ 1912 - notified of patient's complaints, assessments, lab & U/S results, tx plan IVF and repeat BPP - agrees with plan, ok to po hydrate also  Results for orders placed or performed during the hospital encounter of 07/28/20 (from the past 24 hour(s))  Urinalysis, Routine w reflex microscopic Urine, Clean Catch     Status: Abnormal   Collection Time: 07/28/20  5:56 PM  Result Value Ref Range   Color, Urine YELLOW YELLOW   APPearance HAZY (A) CLEAR   Specific Gravity, Urine 1.012 1.005 - 1.030   pH 6.0 5.0 - 8.0   Glucose, UA NEGATIVE NEGATIVE mg/dL   Hgb urine dipstick NEGATIVE NEGATIVE    Bilirubin Urine NEGATIVE NEGATIVE   Ketones, ur 20 (A) NEGATIVE mg/dL   Protein, ur NEGATIVE NEGATIVE mg/dL   Nitrite NEGATIVE NEGATIVE   Leukocytes,Ua NEGATIVE NEGATIVE    Report given to and care  assumed by Wynelle Bourgeois, CNM  Raelyn Mora, CNM 07/28/2020, 6:57 PM   NST has been reactive for both twins Sent to [place] for BPP, received notice that Dr Grace Bushy does not recommend repeat BPP He recommends discharge home and followup in office later this week with NST or BPP    Dr Donavan Foil agrees Message sent to [facility] office Assessment and Plan  A:    Mono/Di Twin pregnancy at [redacted]w[redacted]d         BPP 6/8 and 8/8 earlier today        Reactive prolonged NST for both twins  P:    Discharge home         Monitor fetal movement         Followup later this week in office        Encouraged to return if she develops worsening of symptoms, increase in pain, fever, or other concerning symptoms.    Aviva Signs, CNM

## 2020-07-31 ENCOUNTER — Other Ambulatory Visit: Payer: Self-pay

## 2020-07-31 ENCOUNTER — Ambulatory Visit (INDEPENDENT_AMBULATORY_CARE_PROVIDER_SITE_OTHER): Payer: Medicaid Other | Admitting: *Deleted

## 2020-07-31 VITALS — BP 132/75 | HR 108 | Wt 239.0 lb

## 2020-07-31 DIAGNOSIS — O0993 Supervision of high risk pregnancy, unspecified, third trimester: Secondary | ICD-10-CM | POA: Diagnosis not present

## 2020-07-31 DIAGNOSIS — O30033 Twin pregnancy, monochorionic/diamniotic, third trimester: Secondary | ICD-10-CM

## 2020-07-31 NOTE — Progress Notes (Addendum)
   NURSE VISIT- NST  SUBJECTIVE:  Christina Conley is a 25 y.o. G48P1011 female at [redacted]w[redacted]d, here for a NST for pregnancy complicated by Multiple gestation.  She reports active fetal movement, contractions: occastional, vaginal bleeding: none, membranes: intact.   OBJECTIVE:  LMP 12/08/2019   Appears well, no apparent distress  No results found for this or any previous visit (from the past 24 hour(s)).  NST: FHR A baseline 140 bpm, Variability: moderate, Accelerations:present, Decelerations:  Absent= Cat 1/reactive FHR B baseline 140, variability: moderate, Accelerations:present, decelerations: absent Toco: none   ASSESSMENT: G3P1011 at [redacted]w[redacted]d with Multiple gestation NST reactive  PLAN: EFM strip reviewed by Dr. Despina Hidden   Recommendations: keep next appointment as scheduled    Jobe Marker  07/31/2020 1:11 PM  Attestation of Attending Supervision of Nursing Visit Encounter: Evaluation and management procedures were performed by the nursing staff under my supervision and collaboration.  I have reviewed the nurse's note and chart, and I agree with the management and plan.  Rockne Coons MD Attending Physician for the Center for [facility] 08/01/2020 11:03 PM

## 2020-08-03 ENCOUNTER — Other Ambulatory Visit: Payer: Self-pay | Admitting: Obstetrics & Gynecology

## 2020-08-03 DIAGNOSIS — O30033 Twin pregnancy, monochorionic/diamniotic, third trimester: Secondary | ICD-10-CM

## 2020-08-04 ENCOUNTER — Ambulatory Visit (INDEPENDENT_AMBULATORY_CARE_PROVIDER_SITE_OTHER): Payer: Medicaid Other

## 2020-08-04 ENCOUNTER — Ambulatory Visit (INDEPENDENT_AMBULATORY_CARE_PROVIDER_SITE_OTHER): Payer: Medicaid Other | Admitting: Obstetrics & Gynecology

## 2020-08-04 ENCOUNTER — Other Ambulatory Visit: Payer: Self-pay

## 2020-08-04 ENCOUNTER — Encounter: Payer: Self-pay | Admitting: Obstetrics & Gynecology

## 2020-08-04 VITALS — BP 116/79 | HR 95 | Wt 239.0 lb

## 2020-08-04 DIAGNOSIS — Z3A35 35 weeks gestation of pregnancy: Secondary | ICD-10-CM

## 2020-08-04 DIAGNOSIS — O0993 Supervision of high risk pregnancy, unspecified, third trimester: Secondary | ICD-10-CM

## 2020-08-04 DIAGNOSIS — O30033 Twin pregnancy, monochorionic/diamniotic, third trimester: Secondary | ICD-10-CM

## 2020-08-04 NOTE — Progress Notes (Signed)
[place] MO/DI twins,35+1 WKS BABY A,cephalic right,BPP 8/8,fhr 146 bpm,anterior placenta gr 3,svp of fluid 4.5 cm BABY B,cephalic left,BPP 6/8 no breathing,fhr 146 bpm,svp of fluid 3.6 cm

## 2020-08-04 NOTE — Progress Notes (Signed)
HIGH-RISK PREGNANCY VISIT Patient name: Christina Conley MRN [id number]  Date of birth: 09-07-1995 Chief Complaint:   Routine Prenatal Visit ([place] today)  History of Present Illness:   Christina Conley is a 25 y.o. G38P1011 female at [redacted]w[redacted]d with an Estimated Date of Delivery: 09/07/20 being seen today for ongoing management of a high-risk pregnancy complicated by uncomplicated MCDA twin gestation.    Today she reports no complaints. Contractions: Irregular. Vag. Bleeding: None.  Movement: Present. denies leaking of fluid.   Depression screen [facility] 2/9 02/26/2020 01/14/2020 10/15/2012 10/15/2012  Decreased Interest 3 3 0 0  Down, Depressed, Hopeless 1 0 1 1  PHQ - 2 Score 4 3 1 1   Altered sleeping 3 3 2 2   Tired, decreased energy 3 3 1  -  Change in appetite 2 3 2  -  Feeling bad or failure about yourself  0 1 1 -  Trouble concentrating 0 0 0 -  Moving slowly or fidgety/restless 0 0 0 -  Suicidal thoughts 0 0 0 -  PHQ-9 Score 12 13 7 3      GAD 7 : Generalized Anxiety Score 02/26/2020 01/14/2020  Nervous, Anxious, on Edge 1 2  Control/stop worrying 2 1  Worry too much - different things 2 3  Trouble relaxing 3 1  Restless 0 0  Easily annoyed or irritable 2 3  Afraid - awful might happen 0 2  Total GAD 7 Score 10 12     Review of Systems:   Pertinent items are noted in HPI Denies abnormal vaginal discharge w/ itching/odor/irritation, headaches, visual changes, shortness of breath, chest pain, abdominal pain, severe nausea/vomiting, or problems with urination or bowel movements unless otherwise stated above. Pertinent History Reviewed:  Reviewed past medical,surgical, social, obstetrical and family history.  Reviewed problem list, medications and allergies. Physical Assessment:   Vitals:   08/04/20 1435  BP: 116/79  Pulse: 95  Weight: 239 lb (108.4 kg)  Body mass index is 33.33 kg/m.           Physical Examination:   General appearance: alert, well appearing, and in no  distress  Mental status: alert, oriented to person, place, and time  Skin: warm & dry   Extremities: Edema: Trace    Cardiovascular: normal heart rate noted  Respiratory: normal respiratory effort, no distress  Abdomen: gravid, soft, non-tender  Pelvic: Cervical exam deferred         Fetal Status:     Movement: Present    Fetal Surveillance Testing today: BPP 8/8 Twin A BPP 6/8 twin B(no breathing motion)  Non reactive NST for both twins  Christina Conley is at [redacted]w[redacted]d Estimated Date of Delivery: 09/07/20  NST being performed due to 6/8 twin B  Today the NST is non Reactive  Fetal Monitoring:  Baseline: 140s bpm, Variability: Fair (1-6 bpm), Accelerations: non reactive, and Decelerations: Absent   nonreactive  The accelerations are >15 bpm and more than 2 in 20 minutes  Final diagnosis: Non Reactive NST for both twins  8/8, MD     Chaperone: [name]    No results found for this or any previous visit (from the past 24 hour(s)).  Assessment & Plan:  High-risk pregnancy: G3P1011 at [redacted]w[redacted]d with an Estimated Date of Delivery: 09/07/20   1) MCDA twins, uncomplicated, stable Twin A 8/10 today Twin B 6/10  Will repeat NST tomorrow and if NST NR then will need BPPs for fetal surveillance  Meds: No orders of  the defined types were placed in this encounter.   Labs/procedures today: NST and U/S  Treatment Plan:  as above  Reviewed: Preterm labor symptoms and general obstetric precautions including but not limited to vaginal bleeding, contractions, leaking of fluid and fetal movement were reviewed in detail with the patient.  All questions were answered. Does have home bp cuff. Office bp cuff given: not applicable. Check bp daily, let us know if consistently >140 and/or >90.  Follow-up: No follow-ups on file.   Future Appointments  Date Time Provider Department Center  08/11/2020  1:30 PM [facility] [place] [facility] None  08/11/2020  2:30 PM Cheral Marker, CNM  [facility]  08/18/2020  1:30 PM [facility] [place] [facility] None  08/18/2020  2:30 PM Lazaro Arms, MD [facility]  08/25/2020  1:30 PM [facility] - [facility] [place] [facility] None  08/25/2020  2:30 PM Lazaro Arms, MD [facility]    No orders of the defined types were placed in this encounter.  Lazaro Arms  08/04/2020 3:37 PM

## 2020-08-05 ENCOUNTER — Ambulatory Visit (INDEPENDENT_AMBULATORY_CARE_PROVIDER_SITE_OTHER): Payer: Medicaid Other | Admitting: *Deleted

## 2020-08-05 VITALS — BP 121/75 | HR 103 | Wt 240.0 lb

## 2020-08-05 DIAGNOSIS — O30033 Twin pregnancy, monochorionic/diamniotic, third trimester: Secondary | ICD-10-CM | POA: Diagnosis not present

## 2020-08-05 DIAGNOSIS — O0993 Supervision of high risk pregnancy, unspecified, third trimester: Secondary | ICD-10-CM | POA: Diagnosis not present

## 2020-08-05 DIAGNOSIS — O288 Other abnormal findings on antenatal screening of mother: Secondary | ICD-10-CM

## 2020-08-05 DIAGNOSIS — Z331 Pregnant state, incidental: Secondary | ICD-10-CM

## 2020-08-05 DIAGNOSIS — Z1389 Encounter for screening for other disorder: Secondary | ICD-10-CM

## 2020-08-05 LAB — POCT URINALYSIS DIPSTICK OB
Blood, UA: NEGATIVE
Glucose, UA: NEGATIVE
Leukocytes, UA: NEGATIVE
Nitrite, UA: NEGATIVE

## 2020-08-05 NOTE — Progress Notes (Addendum)
   NURSE VISIT- NST  SUBJECTIVE:  Christina Conley is a 25 y.o. G41P1011 female at [redacted]w[redacted]d, here for a NST for pregnancy complicated by Multiple gestation.  She reports active fetal movement, contractions:UI, vaginal bleeding: none, membranes: intact.   OBJECTIVE:  BP 121/75   Pulse (!) 103   Wt 240 lb (108.9 kg)   LMP 12/08/2019   BMI 33.47 kg/m   Appears well, no apparent distress  Results for orders placed or performed in visit on 08/05/20 (from the past 24 hour(s))  POC Urinalysis Dipstick OB   Collection Time: 08/05/20 12:13 PM  Result Value Ref Range   Color, UA     Clarity, UA     Glucose, UA Negative Negative   Bilirubin, UA     Ketones, UA moderate    Spec Grav, UA     Blood, UA neg    pH, UA     POC,PROTEIN,UA Trace Negative, Trace, Small (1+), Moderate (2+), Large (3+), 4+   Urobilinogen, UA     Nitrite, UA neg    Leukocytes, UA Negative Negative   Appearance     Odor      NST: FHR A baseline 140 bpm, Variability: moderate, Accelerations:present, Decelerations:  Absent= Cat 1/reactive FHR B baseline 135, Variability: Moderate, Accretions:present, Decelerations: Absent=Cat1/reactive Toco: UI   ASSESSMENT: G3P1011 at [redacted]w[redacted]d with Multiple gestation NST reactive  PLAN: EFM strip reviewed by Philipp Deputy, CNM   Recommendations: keep next appointment as scheduled for NST Friday.  Debbe Odea Tore Carreker  08/05/2020 3:00 PM  Chart reviewed for nurse visit. Agree with plan of care.  Arabella Merles, CNM 08/05/2020 4:57 PM

## 2020-08-07 ENCOUNTER — Other Ambulatory Visit: Payer: Self-pay

## 2020-08-07 ENCOUNTER — Telehealth (HOSPITAL_COMMUNITY): Payer: Self-pay | Admitting: *Deleted

## 2020-08-07 ENCOUNTER — Ambulatory Visit (INDEPENDENT_AMBULATORY_CARE_PROVIDER_SITE_OTHER): Payer: Medicaid Other | Admitting: *Deleted

## 2020-08-07 VITALS — BP 131/82 | HR 109 | Wt 239.0 lb

## 2020-08-07 DIAGNOSIS — O30033 Twin pregnancy, monochorionic/diamniotic, third trimester: Secondary | ICD-10-CM

## 2020-08-07 DIAGNOSIS — O0993 Supervision of high risk pregnancy, unspecified, third trimester: Secondary | ICD-10-CM | POA: Diagnosis not present

## 2020-08-07 DIAGNOSIS — Z3A35 35 weeks gestation of pregnancy: Secondary | ICD-10-CM | POA: Diagnosis not present

## 2020-08-07 NOTE — Progress Notes (Addendum)
   NURSE VISIT- NST  SUBJECTIVE:  Christina Conley is a 25 y.o. G51P1011 female at [redacted]w[redacted]d, here for a NST for pregnancy complicated by Multiple gestation.  She reports active fetal movement, contractions: occasional, vaginal bleeding: none, membranes: intact.   OBJECTIVE:  LMP 12/08/2019   Appears well, no apparent distress  No results found for this or any previous visit (from the past 24 hour(s)).  NST: FHR A baseline 135 bpm, Variability: moderate, Accelerations:present, Decelerations:  Absent= Cat 1/reactive FHR B Baseline 130, Variability:moderate, Accelerations:present, Decelerations:Absent=Cat 1 Toco: none   ASSESSMENT: G3P1011 at [redacted]w[redacted]d with Multiple gestation NST reactive  PLAN: EFM strip reviewed by Dr. Despina Hidden   Recommendations: keep next appointment as scheduled    Jobe Marker  08/07/2020 11:08 AM  Attestation of Attending Supervision of Nursing Visit Encounter: Evaluation and management procedures were performed by the nursing staff under my supervision and collaboration.  I have reviewed the nurse's note and chart, and I agree with the management and plan.  Rockne Coons MD Attending Physician for the Center for [facility] 08/07/2020 11:02 PM

## 2020-08-07 NOTE — Telephone Encounter (Signed)
Preadmission screen  

## 2020-08-09 ENCOUNTER — Other Ambulatory Visit: Payer: Self-pay

## 2020-08-10 ENCOUNTER — Inpatient Hospital Stay (HOSPITAL_COMMUNITY): Payer: Medicaid Other | Admitting: Anesthesiology

## 2020-08-10 ENCOUNTER — Encounter (HOSPITAL_COMMUNITY): Payer: Self-pay | Admitting: Obstetrics & Gynecology

## 2020-08-10 ENCOUNTER — Inpatient Hospital Stay (HOSPITAL_COMMUNITY): Payer: Medicaid Other

## 2020-08-10 ENCOUNTER — Inpatient Hospital Stay (HOSPITAL_COMMUNITY)
Admission: AD | Admit: 2020-08-10 | Discharge: 2020-08-13 | DRG: 832 | Disposition: A | Discharge status: Home / Self Care | Payer: Medicaid Other | Attending: Family Medicine | Admitting: Family Medicine

## 2020-08-10 DIAGNOSIS — Z880 Allergy status to penicillin: Secondary | ICD-10-CM | POA: Diagnosis not present

## 2020-08-10 DIAGNOSIS — Z20822 Contact with and (suspected) exposure to covid-19: Secondary | ICD-10-CM | POA: Diagnosis present

## 2020-08-10 DIAGNOSIS — O322XX0 Maternal care for transverse and oblique lie, not applicable or unspecified: Secondary | ICD-10-CM | POA: Diagnosis not present

## 2020-08-10 DIAGNOSIS — D62 Acute posthemorrhagic anemia: Secondary | ICD-10-CM | POA: Diagnosis not present

## 2020-08-10 DIAGNOSIS — O30033 Twin pregnancy, monochorionic/diamniotic, third trimester: Principal | ICD-10-CM | POA: Diagnosis present

## 2020-08-10 DIAGNOSIS — Z87891 Personal history of nicotine dependence: Secondary | ICD-10-CM

## 2020-08-10 DIAGNOSIS — O9081 Anemia of the puerperium: Secondary | ICD-10-CM | POA: Diagnosis not present

## 2020-08-10 DIAGNOSIS — O9902 Anemia complicating childbirth: Secondary | ICD-10-CM | POA: Diagnosis not present

## 2020-08-10 DIAGNOSIS — O30039 Twin pregnancy, monochorionic/diamniotic, unspecified trimester: Secondary | ICD-10-CM | POA: Diagnosis present

## 2020-08-10 DIAGNOSIS — Z3A36 36 weeks gestation of pregnancy: Secondary | ICD-10-CM | POA: Diagnosis not present

## 2020-08-10 DIAGNOSIS — O99019 Anemia complicating pregnancy, unspecified trimester: Secondary | ICD-10-CM | POA: Diagnosis present

## 2020-08-10 DIAGNOSIS — J45909 Unspecified asthma, uncomplicated: Secondary | ICD-10-CM | POA: Diagnosis not present

## 2020-08-10 DIAGNOSIS — O0990 Supervision of high risk pregnancy, unspecified, unspecified trimester: Secondary | ICD-10-CM

## 2020-08-10 DIAGNOSIS — O326XX0 Maternal care for compound presentation, not applicable or unspecified: Secondary | ICD-10-CM | POA: Diagnosis not present

## 2020-08-10 DIAGNOSIS — O9952 Diseases of the respiratory system complicating childbirth: Secondary | ICD-10-CM | POA: Diagnosis not present

## 2020-08-10 LAB — RPR: RPR Ser Ql: NONREACTIVE

## 2020-08-10 LAB — CBC
HCT: 26.8 % — ABNORMAL LOW (ref 36.0–46.0)
Hemoglobin: 8.3 g/dL — ABNORMAL LOW (ref 12.0–15.0)
MCH: 24.3 pg — ABNORMAL LOW (ref 26.0–34.0)
MCHC: 31 g/dL (ref 30.0–36.0)
MCV: 78.4 fL — ABNORMAL LOW (ref 80.0–100.0)
Platelets: 299 10*3/uL (ref 150–400)
RBC: 3.42 MIL/uL — ABNORMAL LOW (ref 3.87–5.11)
RDW: 16.7 % — ABNORMAL HIGH (ref 11.5–15.5)
WBC: 10.8 10*3/uL — ABNORMAL HIGH (ref 4.0–10.5)
nRBC: 0 % (ref 0.0–0.2)

## 2020-08-10 LAB — RESP PANEL BY RT-PCR (FLU A&B, COVID) ARPGX2
Influenza A by PCR: NEGATIVE
Influenza B by PCR: NEGATIVE
SARS Coronavirus 2 by RT PCR: NEGATIVE

## 2020-08-10 MED ORDER — LIDOCAINE-EPINEPHRINE (PF) 2 %-1:200000 IJ SOLN
INTRAMUSCULAR | Status: DC | PRN
  Administered 2020-08-10: 5 mL via EPIDURAL

## 2020-08-10 MED ORDER — FENTANYL CITRATE (PF) 100 MCG/2ML IJ SOLN
50.0000 ug | INTRAMUSCULAR | Status: DC | PRN
Start: 2020-08-10 — End: 2020-08-11

## 2020-08-10 MED ORDER — PHENYLEPHRINE 40 MCG/ML (10ML) SYRINGE FOR IV PUSH (FOR BLOOD PRESSURE SUPPORT): 80.0000 ug | PREFILLED_SYRINGE | INTRAVENOUS | Status: DC | PRN

## 2020-08-10 MED ORDER — ACETAMINOPHEN 325 MG PO TABS: 650.0000 mg | ORAL_TABLET | ORAL | Status: DC | PRN

## 2020-08-10 MED ORDER — SOD CITRATE-CITRIC ACID 500-334 MG/5ML PO SOLN: 30.0000 mL | ORAL | Status: DC | PRN

## 2020-08-10 MED ORDER — LIDOCAINE HCL (PF) 1 % IJ SOLN: 30.0000 mL | INTRAMUSCULAR | Status: DC | PRN

## 2020-08-10 MED ORDER — EPHEDRINE 5 MG/ML INJ: 10.0000 mg | INTRAVENOUS | Status: DC | PRN

## 2020-08-10 MED ORDER — FENTANYL CITRATE (PF) 100 MCG/2ML IJ SOLN: 25.0000 ug | Freq: Once | INTRAMUSCULAR | Status: DC

## 2020-08-10 MED ORDER — PHENYLEPHRINE 40 MCG/ML (10ML) SYRINGE FOR IV PUSH (FOR BLOOD PRESSURE SUPPORT)
80.0000 ug | PREFILLED_SYRINGE | INTRAVENOUS | Status: DC | PRN
  Filled 2020-08-10 (×2): qty 10

## 2020-08-10 MED ORDER — CLINDAMYCIN PHOSPHATE 900 MG/50ML IV SOLN
900.0000 mg | Freq: Three times a day (TID) | INTRAVENOUS | Status: DC
  Administered 2020-08-10: 900 mg via INTRAVENOUS
  Filled 2020-08-10: qty 50

## 2020-08-10 MED ORDER — LACTATED RINGERS IV SOLN
500.0000 mL | Freq: Once | INTRAVENOUS | Status: AC
  Administered 2020-08-10: 500 mL via INTRAVENOUS

## 2020-08-10 MED ORDER — LACTATED RINGERS IV SOLN: 500.0000 mL | Freq: Once | INTRAVENOUS | Status: DC

## 2020-08-10 MED ORDER — FENTANYL-BUPIVACAINE-NACL 0.5-0.125-0.9 MG/250ML-% EP SOLN
12.0000 mL/h | EPIDURAL | Status: DC | PRN
  Administered 2020-08-10: 12 mL/h via EPIDURAL
  Filled 2020-08-10 (×2): qty 250

## 2020-08-10 MED ORDER — OXYTOCIN BOLUS FROM INFUSION
333.0000 mL | Freq: Once | INTRAVENOUS | Status: AC
  Administered 2020-08-11: 333 mL via INTRAVENOUS

## 2020-08-10 MED ORDER — ONDANSETRON HCL 4 MG/2ML IJ SOLN: 4.0000 mg | Freq: Four times a day (QID) | INTRAMUSCULAR | Status: DC | PRN

## 2020-08-10 MED ORDER — DIPHENHYDRAMINE HCL 50 MG/ML IJ SOLN
12.5000 mg | INTRAMUSCULAR | Status: DC | PRN
Start: 2020-08-10 — End: 2020-08-11

## 2020-08-10 MED ORDER — CEFAZOLIN SODIUM-DEXTROSE 1-4 GM/50ML-% IV SOLN
1.0000 g | Freq: Three times a day (TID) | INTRAVENOUS | Status: DC
  Administered 2020-08-10: 1 g via INTRAVENOUS
  Filled 2020-08-10 (×3): qty 50

## 2020-08-10 MED ORDER — FENTANYL CITRATE (PF) 100 MCG/2ML IJ SOLN
INTRAMUSCULAR | Status: AC
  Filled 2020-08-10: qty 2

## 2020-08-10 MED ORDER — MISOPROSTOL 50MCG HALF TABLET
50.0000 ug | ORAL_TABLET | ORAL | Status: DC
  Administered 2020-08-10 (×2): 50 ug via ORAL
  Filled 2020-08-10: qty 1

## 2020-08-10 MED ORDER — OXYTOCIN-SODIUM CHLORIDE 30-0.9 UT/500ML-% IV SOLN
1.0000 m[IU]/min | INTRAVENOUS | Status: DC
  Administered 2020-08-10: 2 m[IU]/min via INTRAVENOUS
  Filled 2020-08-10: qty 500

## 2020-08-10 MED ORDER — CEFAZOLIN SODIUM-DEXTROSE 2-4 GM/100ML-% IV SOLN
2.0000 g | Freq: Once | INTRAVENOUS | Status: AC
  Administered 2020-08-10: 2 g via INTRAVENOUS
  Filled 2020-08-10: qty 100

## 2020-08-10 MED ORDER — MISOPROSTOL 50MCG HALF TABLET
ORAL_TABLET | ORAL | Status: AC
  Filled 2020-08-10: qty 1

## 2020-08-10 MED ORDER — LACTATED RINGERS IV SOLN: INTRAVENOUS | Status: DC

## 2020-08-10 MED ORDER — TERBUTALINE SULFATE 1 MG/ML IJ SOLN: 0.2500 mg | Freq: Once | INTRAMUSCULAR | Status: DC | PRN

## 2020-08-10 MED ORDER — FENTANYL-BUPIVACAINE-NACL 0.5-0.125-0.9 MG/250ML-% EP SOLN: 12.0000 mL/h | EPIDURAL | Status: DC | PRN

## 2020-08-10 MED ORDER — LACTATED RINGERS IV SOLN: 500.0000 mL | INTRAVENOUS | Status: DC | PRN

## 2020-08-10 MED ORDER — OXYTOCIN-SODIUM CHLORIDE 30-0.9 UT/500ML-% IV SOLN: 2.5000 [IU]/h | INTRAVENOUS | Status: DC

## 2020-08-10 NOTE — Progress Notes (Signed)
Labor Progress Note [name] is a 25 y.o. G3P1011 at [redacted]w[redacted]d presented for IOL for MoDI twins   S: Breathing through contractions, thinking about epidural   O:  BP 120/81   Pulse 93   Temp 98.1 F (36.7 C) (Oral)   Resp 18   Ht 5\' 11"  (1.803 m)   Wt 110.4 kg   LMP 12/08/2019   BMI 33.93 kg/m  EFM: Twin A: baseline 130/mod var/pos accels/no decels  Twin B: baseline 140/mod var/pos accels/no decels   CVE: Dilation: 2 Effacement (%): 50 Station: -2 Presentation: Vertex Exam by:: Dr. [email or web address]   A&P: 25 y.o. 25 [redacted]w[redacted]d here for IOL for Mo-Di Twins  #Labor: Progressing well. S/p cytotec x1. Pt contracting too frequently for another dose of cytotec, will transition to pitocin.   #Pain: per patient request #FWB: cat I  #GBS not done, given PCN allergy on clindamycin  #Anemia: admit hgb 8.3 TXA at delivery.   [redacted]w[redacted]d, MD 5:48 AM

## 2020-08-10 NOTE — H&P (Addendum)
OBSTETRIC ADMISSION HISTORY AND PHYSICAL  LEDONNA DORMER is a 25 y.o. female G41P1011 with IUP at [redacted]w[redacted]d by 8w [place] presenting for IOL with monochorionic diamniotic twins. She reports +FMs, No LOF, no VB.  She plans on breast and bottle feeding. She is undecided for birth control, considering Nexplanon or Depo. She received her prenatal care at [facility]   Dating: By 8w [place] --->  Estimated Date of Delivery: 09/07/20  Sono:    @[redacted]w[redacted]d , CWD, normal anatomy, cephalic/transverse presentation, anterior/anterior lie, 2290g/2236g, 63%/56 EFW (2.4% discordance)   Prenatal History/Complications:  Monochorionic diamniotic twin gestation, uncomplicated Iron Deficiency Anemia - hgb 8 at last check   Past Medical History: Past Medical History:  Diagnosis Date   Asthma    child.  No meds > 2 years   BV (bacterial vaginosis) 07/02/2012   Contraceptive management 05/27/2015   Environmental allergies    Gonorrhea 08/2015   Irregular intermenstrual bleeding 05/27/2015   LLQ pain 05/27/2015   Round ligament pain 12/09/2013   Vaginal discharge 12/09/2013    Past Surgical History: Past Surgical History:  Procedure Laterality Date   NO PAST SURGERIES      Obstetrical History: OB History     Gravida  3   Para  1   Term  1   Preterm      AB  1   Living  1      SAB  1   IAB      Ectopic      Multiple  0   Live Births  1           Social History Social History   Socioeconomic History   Marital status: Single    Spouse name: Not on file   Number of children: Not on file   Years of education: Not on file   Highest education level: Not on file  Occupational History   Not on file  Tobacco Use   Smoking status: Former    Packs/day: 0.25    Years: 0.50    Pack years: 0.13    Types: Cigarettes   Smokeless tobacco: Never  Vaping Use   Vaping Use: Never used  Substance and Sexual Activity   Alcohol use: No   Drug use: No    Types: Marijuana    Comment: quit 2015    Sexual activity: Yes    Birth control/protection: None  Other Topics Concern   Not on file  Social History Narrative   Not on file   Social Determinants of Health   Financial Resource Strain: Low Risk    Difficulty of Paying Living Expenses: Not hard at all  Food Insecurity: No Food Insecurity   Worried About 2016 in the Last Year: Never true   Ran Out of Food in the Last Year: Never true  Transportation Needs: No Transportation Needs   Lack of Transportation (Medical): No   Lack of Transportation (Non-Medical): No  Physical Activity: Inactive   Days of Exercise per Week: 0 days   Minutes of Exercise per Session: 0 min  Stress: Stress Concern Present   Feeling of Stress : Rather much  Social Connections: Moderately Isolated   Frequency of Communication with Friends and Family: More than three times a week   Frequency of Social Gatherings with Friends and Family: Three times a week   Attends Religious Services: More than 4 times per year   Active Member of Clubs or Organizations: No   Attends  Banker Meetings: Never   Marital Status: Never married    Family History: Family History  Problem Relation Age of Onset   Hypertension Maternal Grandfather    Hyperlipidemia Maternal Grandfather    Crohn's disease Mother    Other Father        problems with intestines   Crohn's disease Paternal Grandmother    Hypertension Maternal Grandmother    Miscarriages / Stillbirths Maternal Grandmother     Allergies: Allergies  Allergen Reactions   Amoxicillin Hives   Ampicillin Hives   Penicillins Hives    Has patient had a PCN reaction causing immediate rash, facial/tongue/throat swelling, SOB or lightheadedness with hypotension: Yes Has patient had a PCN reaction causing severe rash involving mucus membranes or skin necrosis: No Has patient had a PCN reaction that required hospitalization No Has patient had a PCN reaction occurring within the last 10  years: unknown If all of the above answers are "NO", then may proceed with Cephalosporin use.     Medications Prior to Admission  Medication Sig Dispense Refill Last Dose   acetaminophen (TYLENOL) 500 MG tablet Take 1,000 mg by mouth every 6 (six) hours as needed.      aspirin 81 MG chewable tablet Chew 2 tablets (162 mg total) by mouth daily. 60 tablet 7    Blood Pressure Monitor MISC For regular home bp monitoring during pregnancy 1 each 0    butalbital-acetaminophen-caffeine (FIORICET) 50-325-40 MG tablet Take 1-2 tablets by mouth every 6 (six) hours as needed for headache. 20 tablet 0    ferrous sulfate 325 (65 FE) MG tablet Take 1 tablet (325 mg total) by mouth every other day. 15 tablet 3    omeprazole (PRILOSEC) 20 MG capsule Take 1 capsule (20 mg total) by mouth daily. 1 tablet a day 30 capsule 6    ondansetron (ZOFRAN ODT) 8 MG disintegrating tablet Take 1 tablet (8 mg total) by mouth every 8 (eight) hours as needed for nausea or vomiting. 20 tablet 0    Prenatal Vit-Fe Fumarate-FA (PRENATAL VITAMIN PO) Take by mouth.        Review of Systems   All systems reviewed and negative except as stated in HPI  Last menstrual period 12/08/2019. General appearance: alert, cooperative, appears stated age, and no distress Lungs: non-labored respirations Heart: regular rate Abdomen: gravid Presentation: cephalic/cephalic by US Fetal monitoring Twin A: baseline 140/moderate variability/+accels/no decels Twin B: baseline 140/moderate variability/+accels/no decels Uterine activityFrequency: Every 3-5 minutes Dilation: 1.5 Effacement (%): 50 Station: -2   Prenatal labs: ABO, Rh: --/--/PENDING (06/20 0033) Antibody: PENDING (06/20 0033) Rubella: 19.50 (01/05 1550) RPR: Non Reactive (04/25 0852)  HBsAg: Negative (01/05 1550)  HIV: Non Reactive (04/25 0852)  GBS:   unknown, pending 2 hr Glucola nl Genetic screening  Panorama low risk Anatomy US nl  Prenatal Transfer Tool   Maternal Diabetes: No Genetic Screening: Normal Maternal Ultrasounds/Referrals: Normal Fetal Ultrasounds or other Referrals:  None Maternal Substance Abuse:  No Significant Maternal Medications:  Meds include: Other: omeprazole Significant Maternal Lab Results: Other: GBS unknown  Results for orders placed or performed during the hospital encounter of 08/10/20 (from the past 24 hour(s))  CBC   Collection Time: 08/10/20 12:33 AM  Result Value Ref Range   WBC 10.8 (H) 4.0 - 10.5 K/uL   RBC 3.42 (L) 3.87 - 5.11 MIL/uL   Hemoglobin 8.3 (L) 12.0 - 15.0 g/dL   HCT 22.9 (L) 79.8 - 92.1 %   MCV 78.4 (L)  80.0 - 100.0 fL   MCH 24.3 (L) 26.0 - 34.0 pg   MCHC 31.0 30.0 - 36.0 g/dL   RDW 71.6 (H) 96.7 - 89.3 %   Platelets 299 150 - 400 K/uL   nRBC 0.0 0.0 - 0.2 %  Type and screen [facility]   Collection Time: 08/10/20 12:33 AM  Result Value Ref Range   ABO/RH(D) PENDING    Antibody Screen PENDING    Sample Expiration      08/13/2020,2359 Performed at [facility], [address] [place], [place] [address]     Patient Active Problem List   Diagnosis Date Noted   Supervision of high-risk pregnancy 02/26/2020   Monochorionic diamniotic twin gestation 02/26/2020    Assessment/Plan:  CHARLEAN CARNEAL is a 25 y.o. G3P1011 at [redacted]w[redacted]d here for IOL with monochorionic diamniotic twins  #IOL:   Latent, cervical ripening with Cytotec (0128). Discussed delivery of twins (currently vtx/vtx) and counseled on possible modes of delivery, including possibility of breech extraction, cesarean section. Answered all patient questions. Confirmed vertex/vertex by [place] on arrival to L&D.   #Pain: Epidural desired #FWB: Cat I/cat I #ID:  GBS unknown > clindamycin (PCN allergy, GBS+ in previous pregnancy treated with clindamycin) #MOF: both #MOC:considering Nexplanon or Depo #Circ:  Desired inpatient  Littie Deeds, MD  08/10/2020, 1:25 AM  I saw and evaluated the patient. I agree  with the findings and the plan of care as documented in the resident's note.  Casper Harrison, MD [facility] Family Medicine Fellow, [facility] for [facility], [facility]

## 2020-08-10 NOTE — Progress Notes (Addendum)
Patient requesting epidural at [phone]. RN notified anesthesia and anesthesia at bedside by [phone]. Patient educated and consented to epidural by Dr. Hart Rochester (anesthesia). Consents signed, patient sitting up for epidural, and timeout performed by RN and MD. At [phone] patient frantically states "I don't want this anymore" multiple times. RN and MD stopped and allowed patient to rest. Patient given all of her options in which she chose to not get an epidural. Patient assisted back down to high fowlers position, FHR monitors reapplied, and patient had no further questions or concerns. Midwife and student midwife arrived in room after patient was repositioned. Student midwife performed an SVE at patients consent and educated patient on pitocin. Patient agrees to pitocin with midwives at bedside. After midwives leave, patient complains of pain but is refusing IV narcotics and epidural. RN extensively explained medicinal and nonmedicinal pain control options, but patient still refuses at this time. Patient also refusing to start pitocin at this time and states "I just want to rest". RN notified midwives, no further orders at this time.

## 2020-08-10 NOTE — Progress Notes (Addendum)
Labor Progress Note                                                                                                                                                                                                                                                                                                                       Christina Conley is a 25 y.o. G3P1011 at [redacted]w[redacted]d by ultrasound admitted for  induction of labor for Mono/Di twins.   Subjective: Christina Conley is anxious and tearful. Pt desires rest at this time.   Objective: Vitals:   08/10/20 0501 08/10/20 0601 08/10/20 0656 08/10/20 0820  BP: 120/81 112/76 106/66 123/87  Pulse: 93 90 90 (!) 101  Resp: 18 18 18 16   Temp:      TempSrc:      Weight:      Height:         FHT (A) :  FHR: 125 bpm, variability: moderate,  accelerations:  Present,  decelerations:  Absent FHT (B) : 130 bpm, variability: moderate,  accelerations:  Present,  decelerations:  Absent UC:   irregular, every 1-5 minutes SVE:   Dilation: 2 Effacement (%): 70 Station: -1 Exam by:: [email or web address] ,SNM   Labs:   Recent Labs    08/10/20 0033  WBC 10.8*  HGB 8.3*  HCT 26.8*  PLT 299    Assessment / Plan: G3P1011 25 y.o. [redacted]w[redacted]d Induction of labor due to multifetal gestation, s/p buccal cytotec x1. Pt very tearful upon arrival to room. Pt recently had a poor experience with epidural. R/B of pitocin discussed with pt, and patient agreeable to plan. RN approached SNM and CNM later, pt now declining Pitocin.   Labor:  Once pt calms down, and if pt is agreeable to plan, will continue with plan for pitocin 2x2 PRN.  Fetal Wellbeing:  Category I FHR (A) and Category I FHR (B)  Pain Control:   Pt may have epidural and IV pain meds; May also have ativan PRN for epidural  placement.  I/D:   GBS unknown, Clindamycin x  1 Anticipated MOD:   Caution for NSVD, Mo/Di Twins   Signed:  Jaaron Oleson Danella Deis) Suzie Portela, BSN, RNC-OB  Student Nurse-Midwife   08/10/2020  9:34 AM

## 2020-08-10 NOTE — Progress Notes (Signed)
Labor Progress Note Christina Conley is a 25 y.o. G3P1011 at [redacted]w[redacted]d presented for IOL-mono/di twins. S: Doing well. Feeling nauseas.  O:  BP 113/79   Pulse 75   Temp 97.8 F (36.6 C) (Oral)   Resp 16   Ht 5\' 11"  (1.803 m)   Wt 110.4 kg   LMP 12/08/2019   SpO2 100%   BMI 33.93 kg/m  Twin A: EFM: baseline 130bpm/mod variability/+accels/no decels Twin B: EFM baseline 120bpm/mod variability/+accels/no decels Toco:q1-4 min  CVE: Dilation: 4 Effacement (%): 60 Cervical Position: Middle Station: -2 Presentation: Vertex Exam by:: Dr. [email or web address]   A&P: 25 y.o. 25 [redacted]w[redacted]d presented for IOL-mono/di twins. #IOL: S/p cyto x2. Pitocin @1630 , currently at 49mL/hr. AROM @1930 . Continue to titrate pitocin.  #Pain: epidural #FWB: cat 1 both twins #GBS  unknown, previously clindamycin in the setting of PCN allergy, switched to ancef given no sensitivities performed #Anemia of pregnancy: admit hgb 8.3, IV iron vs RBCs post partum, pending EBL.  , MD 9:03 PM

## 2020-08-10 NOTE — Anesthesia Preprocedure Evaluation (Addendum)
Anesthesia Evaluation  Patient identified by MRN, date of birth, ID band Patient awake    Reviewed: Allergy & Precautions, Patient's Chart, lab work & pertinent test results  Airway Mallampati: I       Dental no notable dental hx.    Pulmonary asthma , former smoker,    Pulmonary exam normal        Cardiovascular negative cardio ROS Normal cardiovascular exam     Neuro/Psych negative neurological ROS  negative psych ROS   GI/Hepatic negative GI ROS, Neg liver ROS,   Endo/Other  negative endocrine ROS  Renal/GU negative Renal ROS     Musculoskeletal negative musculoskeletal ROS (+)   Abdominal   Peds  Hematology negative hematology ROS (+)   Anesthesia Other Findings   Reproductive/Obstetrics                            Anesthesia Physical Anesthesia Plan  ASA: 2  Anesthesia Plan: Epidural   Post-op Pain Management:    Induction:   PONV Risk Score and Plan: 0  Airway Management Planned: Natural Airway  Additional Equipment: None  Intra-op Plan:   Post-operative Plan:   Informed Consent: I have reviewed the patients History and Physical, chart, labs and discussed the procedure including the risks, benefits and alternatives for the proposed anesthesia with the patient or authorized representative who has indicated his/her understanding and acceptance.       Plan Discussed with:   Anesthesia Plan Comments: (Did declined epidural due to pain of injecting local anesthetic at the skin. )       Anesthesia Quick Evaluation

## 2020-08-10 NOTE — Anesthesia Procedure Notes (Signed)
Epidural Patient location during procedure: OB Start time: 08/10/2020 1:54 PM End time: 08/10/2020 2:00 PM  Staffing Anesthesiologist: Shelton Silvas, MD Performed: anesthesiologist   Preanesthetic Checklist Completed: patient identified, IV checked, site marked, risks and benefits discussed, surgical consent, monitors and equipment checked, pre-op evaluation and timeout performed  Epidural Patient position: sitting Prep: DuraPrep Patient monitoring: heart rate, continuous pulse ox and blood pressure Approach: midline Location: L3-L4 Injection technique: LOR saline  Needle:  Needle type: Tuohy  Needle gauge: 17 G Needle length: 9 cm Catheter type: closed end flexible Catheter size: 20 Guage Test dose: negative and 1.5% lidocaine  Assessment Events: blood not aspirated, injection not painful, no injection resistance and no paresthesia  Additional Notes LOR @ 5.5  Patient identified. Risks/Benefits/Options discussed with patient including but not limited to bleeding, infection, nerve damage, paralysis, failed block, incomplete pain control, headache, blood pressure changes, nausea, vomiting, reactions to medications, itching and postpartum back pain. Confirmed with bedside nurse the patient's most recent platelet count. Confirmed with patient that they are not currently taking any anticoagulation, have any bleeding history or any family history of bleeding disorders. Patient expressed understanding and wished to proceed. All questions were answered. Sterile technique was used throughout the entire procedure. Please see nursing notes for vital signs. Test dose was given through epidural catheter and negative prior to continuing to dose epidural or start infusion. Warning signs of high block given to the patient including shortness of breath, tingling/numbness in hands, complete motor block, or any concerning symptoms with instructions to call for help. Patient was given instructions on  fall risk and not to get out of bed. All questions and concerns addressed with instructions to call with any issues or inadequate analgesia.    Reason for block:procedure for pain

## 2020-08-11 ENCOUNTER — Encounter (HOSPITAL_COMMUNITY): Payer: Self-pay | Admitting: Obstetrics & Gynecology

## 2020-08-11 ENCOUNTER — Other Ambulatory Visit: Payer: Medicaid Other

## 2020-08-11 ENCOUNTER — Encounter: Payer: Medicaid Other | Admitting: Women's Health

## 2020-08-11 DIAGNOSIS — Z3A36 36 weeks gestation of pregnancy: Secondary | ICD-10-CM

## 2020-08-11 DIAGNOSIS — O30033 Twin pregnancy, monochorionic/diamniotic, third trimester: Secondary | ICD-10-CM

## 2020-08-11 DIAGNOSIS — O9902 Anemia complicating childbirth: Secondary | ICD-10-CM

## 2020-08-11 DIAGNOSIS — O411290 Chorioamnionitis, unspecified trimester, not applicable or unspecified: Secondary | ICD-10-CM | POA: Diagnosis not present

## 2020-08-11 DIAGNOSIS — O326XX0 Maternal care for compound presentation, not applicable or unspecified: Secondary | ICD-10-CM

## 2020-08-11 DIAGNOSIS — O30039 Twin pregnancy, monochorionic/diamniotic, unspecified trimester: Secondary | ICD-10-CM | POA: Diagnosis not present

## 2020-08-11 DIAGNOSIS — O43899 Other placental disorders, unspecified trimester: Secondary | ICD-10-CM | POA: Diagnosis not present

## 2020-08-11 DIAGNOSIS — Z3A00 Weeks of gestation of pregnancy not specified: Secondary | ICD-10-CM | POA: Diagnosis not present

## 2020-08-11 LAB — CBC
HCT: 22.7 % — ABNORMAL LOW (ref 36.0–46.0)
Hemoglobin: 7 g/dL — ABNORMAL LOW (ref 12.0–15.0)
MCH: 24.2 pg — ABNORMAL LOW (ref 26.0–34.0)
MCHC: 30.8 g/dL (ref 30.0–36.0)
MCV: 78.5 fL — ABNORMAL LOW (ref 80.0–100.0)
Platelets: 242 10*3/uL (ref 150–400)
RBC: 2.89 MIL/uL — ABNORMAL LOW (ref 3.87–5.11)
RDW: 16.6 % — ABNORMAL HIGH (ref 11.5–15.5)
WBC: 13.3 10*3/uL — ABNORMAL HIGH (ref 4.0–10.5)
nRBC: 0 % (ref 0.0–0.2)

## 2020-08-11 MED ORDER — ACETAMINOPHEN 325 MG PO TABS
650.0000 mg | ORAL_TABLET | ORAL | Status: DC | PRN
  Administered 2020-08-11 – 2020-08-12 (×5): 650 mg via ORAL
  Filled 2020-08-11 (×6): qty 2

## 2020-08-11 MED ORDER — WITCH HAZEL-GLYCERIN EX PADS: 1.0000 "application " | MEDICATED_PAD | CUTANEOUS | Status: DC | PRN

## 2020-08-11 MED ORDER — TETANUS-DIPHTH-ACELL PERTUSSIS 5-2.5-18.5 LF-MCG/0.5 IM SUSY: 0.5000 mL | PREFILLED_SYRINGE | Freq: Once | INTRAMUSCULAR | Status: DC

## 2020-08-11 MED ORDER — SODIUM CHLORIDE 0.9 % IV SOLN
INTRAVENOUS | Status: DC | PRN
  Administered 2020-08-11: 250 mL via INTRAVENOUS

## 2020-08-11 MED ORDER — IBUPROFEN 600 MG PO TABS
600.0000 mg | ORAL_TABLET | Freq: Four times a day (QID) | ORAL | Status: DC
  Administered 2020-08-11 – 2020-08-13 (×10): 600 mg via ORAL
  Filled 2020-08-11 (×10): qty 1

## 2020-08-11 MED ORDER — TRANEXAMIC ACID-NACL 1000-0.7 MG/100ML-% IV SOLN: 1000.0000 mg | INTRAVENOUS | Status: AC

## 2020-08-11 MED ORDER — ONDANSETRON HCL 4 MG PO TABS: 4.0000 mg | ORAL_TABLET | ORAL | Status: DC | PRN

## 2020-08-11 MED ORDER — COCONUT OIL OIL: 1.0000 "application " | TOPICAL_OIL | Status: DC | PRN

## 2020-08-11 MED ORDER — PRENATAL MULTIVITAMIN CH
1.0000 | ORAL_TABLET | Freq: Every day | ORAL | Status: DC
  Administered 2020-08-11 – 2020-08-13 (×3): 1 via ORAL
  Filled 2020-08-11 (×3): qty 1

## 2020-08-11 MED ORDER — ASCORBIC ACID 500 MG PO TABS
250.0000 mg | ORAL_TABLET | ORAL | Status: DC
  Administered 2020-08-11 – 2020-08-13 (×2): 250 mg via ORAL
  Filled 2020-08-11 (×2): qty 1

## 2020-08-11 MED ORDER — DIPHENHYDRAMINE HCL 25 MG PO CAPS: 25.0000 mg | ORAL_CAPSULE | Freq: Four times a day (QID) | ORAL | Status: DC | PRN

## 2020-08-11 MED ORDER — SENNOSIDES-DOCUSATE SODIUM 8.6-50 MG PO TABS
2.0000 | ORAL_TABLET | ORAL | Status: DC
  Administered 2020-08-11 – 2020-08-13 (×3): 2 via ORAL
  Filled 2020-08-11 (×3): qty 2

## 2020-08-11 MED ORDER — SODIUM CHLORIDE 0.9 % IV SOLN
500.0000 mg | Freq: Once | INTRAVENOUS | Status: AC
  Administered 2020-08-11 (×2): 500 mg via INTRAVENOUS
  Filled 2020-08-11: qty 25

## 2020-08-11 MED ORDER — SIMETHICONE 80 MG PO CHEW: 80.0000 mg | CHEWABLE_TABLET | ORAL | Status: DC | PRN

## 2020-08-11 MED ORDER — TRANEXAMIC ACID-NACL 1000-0.7 MG/100ML-% IV SOLN
INTRAVENOUS | Status: AC
  Administered 2020-08-11: 1000 mg via INTRAVENOUS
  Filled 2020-08-11: qty 100

## 2020-08-11 MED ORDER — FERROUS SULFATE 325 (65 FE) MG PO TABS
325.0000 mg | ORAL_TABLET | ORAL | Status: DC
Start: 2020-08-11 — End: 2020-08-13
  Administered 2020-08-13: 325 mg via ORAL
  Filled 2020-08-11 (×2): qty 1

## 2020-08-11 MED ORDER — BENZOCAINE-MENTHOL 20-0.5 % EX AERO: 1.0000 "application " | INHALATION_SPRAY | CUTANEOUS | Status: DC | PRN

## 2020-08-11 MED ORDER — MEASLES, MUMPS & RUBELLA VAC IJ SOLR: 0.5000 mL | Freq: Once | INTRAMUSCULAR | Status: DC

## 2020-08-11 MED ORDER — DIBUCAINE (PERIANAL) 1 % EX OINT: 1.0000 "application " | TOPICAL_OINTMENT | CUTANEOUS | Status: DC | PRN

## 2020-08-11 MED ORDER — ONDANSETRON HCL 4 MG/2ML IJ SOLN: 4.0000 mg | INTRAMUSCULAR | Status: DC | PRN

## 2020-08-11 NOTE — Lactation Note (Signed)
This note was copied from a baby's chart. Lactation Consultation Note Baby A BF great. Latched well. Mom has compressible everted small nipples perfect for baby's size. Mom BF both babies together at this time. Encouraged to BF separate when gets to [facility]. Mom holding STS, and covered in her gown and blankets. Will f/u on MBU.  Patient Name: Christina Conley [id number] Date: 08/11/2020 Reason for consult: L&D Initial assessment;Late-preterm 34-36.6wks;Multiple gestation Age:4 hours  Maternal Data    Feeding    LATCH Score Latch: Grasps breast easily, tongue down, lips flanged, rhythmical sucking.  Audible Swallowing: A few with stimulation  Type of Nipple: Everted at rest and after stimulation  Comfort (Breast/Nipple): Soft / non-tender  Hold (Positioning): Assistance needed to correctly position infant at breast and maintain latch.  LATCH Score: 8   Lactation Tools Discussed/Used    Interventions Interventions: Breast feeding basics reviewed;Adjust position;Assisted with latch;Support pillows;Skin to skin;Position options;Breast massage;Hand express;Breast compression  Discharge    Consult Status Consult Status: Follow-up Date: 08/11/20 Follow-up type: In-patient    Jodee Wagenaar, Diamond Nickel 08/11/2020, 2:21 AM

## 2020-08-11 NOTE — Lactation Note (Signed)
This note was copied from a baby's chart. Lactation Consultation Note Baby sleepy. Mom tried to latch baby. Baby sleepy. Baby needed to be supplemented. Given bottle w/purple slow flow nipple used.  Mom is very sleepy. Keeps falling asleep. Discussed pumping. Mom stated she has a DEBP here with her. Suggested using hospital grade DEBP. Mom agreed. Mom falling asleep while LC setting up. Encouraged mom to pump Q3 hrs and while it important. Lactation brochure and LPI information paper on mom's counter. Mom sleeping when LC left.  Patient Name: Christina Conley [id number] Date: 08/11/2020 Reason for consult: Initial assessment;Late-preterm 34-36.6wks Age:51 hours  Maternal Data Has patient been taught Hand Expression?: Yes  Feeding Nipple Type: Nfant Slow Flow (purple)  LATCH Score                    Lactation Tools Discussed/Used Tools: Pump Breast pump type: Double-Electric Breast Pump Pump Education: Setup, frequency, and cleaning;Milk Storage Reason for Pumping: LPI Pumping frequency: Q3  Interventions Interventions: DEBP;Hand express;Breast compression;Breast massage;Skin to skin;Support pillows;Expressed milk  Discharge    Consult Status Consult Status: Follow-up Date: 08/11/20 Follow-up type: In-patient    Charyl Dancer 08/11/2020, 6:46 AM

## 2020-08-11 NOTE — Lactation Note (Signed)
This note was copied from a baby's chart. Lactation Consultation Note  Patient Name: Christina Conley Date: 08/11/2020 Reason for consult: Follow-up assessment;Late-preterm 34-36.6wks;Infant < 6lbs Age:24 hours   P3 mother whose infant twin boys are now 3 hours old.  These are later preterm infants at 36+1 weeks weighing < 6 lbs.  Mother breast fed her first child (now 2 years old) for a couple of weeks.  Mother's feeding preference is breast/formula.  Arrived to find mother trying to bottle feed "Twin A."  Mother reported that he would not feed.  Offered to assist and mother appreciative.  Demonstrated paced bottle feeding, burping and how to awaken a sleepy baby.  Using the purple extra slow flow nipple he easily consumed 10 mls of formula.  Burped well and asleep after feeding.  Allowed mother to feed "Twin B."  Assisted with nipple placement and encouraging baby to maintain a grasp on the nipple.  Showed mother how to provide cheek/jaw support to encourage a good grasp.  Using the purple extra slow flow nipple, baby consumed 7 mls of formula before becoming too sleepy.  Burped well and placed in bassinet.    Reviewed the LPTI policy with parents and helped them establish a workable feeding plan for today.  Encouraged mother to begin latching after she takes a nap.  Mother is currently very sleepy and is having a difficult time staying awake.  Advised pumping now, however, mother would like to wait to begin.  Suggested she call her RN and begin pumping after the next feeding.  Mother is willing to do this.  Asked parents to call for assistance if they have any difficulty feeding the babies.    Mother has a DEBP for home use. Father assisting with care.  RN updated.   Maternal Data Has patient been taught Hand Expression?: Yes Does the patient have breastfeeding experience prior to this delivery?: Yes How long did the patient breastfeed?: Couple of weeks  Feeding Mother's Current  Feeding Choice: Breast Milk and Formula Nipple Type: Nfant Slow Flow (purple)  LATCH Score                    Lactation Tools Discussed/Used Tools: Bottle;Pump Breast pump type: Double-Electric Breast Pump;Manual Pump Education: Other (comment) (Will review when mother is ready to pump) Reason for Pumping: LPTI; twins; supplementation Pumping frequency: Every three hours  Interventions Interventions: Education  Discharge Pump: DEBP;Manual;Personal  Consult Status Consult Status: Follow-up Date: 08/12/20 Follow-up type: In-patient    Christina Conley 08/11/2020, 10:36 AM

## 2020-08-11 NOTE — Lactation Note (Signed)
This note was copied from a baby's chart. Lactation Consultation Note Mom very tired. While LC was in room mom stated she was gushing blood while LC hand expressing. Explained it was normal to be sleepy, cramping and feeling blood at intervals while BF, pumping, and hand expressing. Mom called for RN to come check her bleeding. Mom's bleeding is WDL. Formula fed baby to keep glucose levels up.  Mom asked to pump later and be explained how to use it later. Mom didn't review LPI information. Left information at bedside. Hope she does use pump as encouraged.  Patient Name: Christina Conley [id number] Date: 08/11/2020 Reason for consult: Initial assessment;Late-preterm 34-36.6wks;Multiple gestation;Infant < 6lbs Age:19 hours  Maternal Data Has patient been taught Hand Expression?: Yes Does the patient have breastfeeding experience prior to this delivery?: Yes How long did the patient breastfeed?: mom stated she tried to BF her now 31 yr old but she wouldn't latch  Feeding Nipple Type: Nfant Slow Flow (purple)  LATCH Score Latch: Too sleepy or reluctant, no latch achieved, no sucking elicited.     Type of Nipple: Everted at rest and after stimulation  Comfort (Breast/Nipple): Soft / non-tender  Hold (Positioning): Full assist, staff holds infant at breast      Lactation Tools Discussed/Used Tools: Pump Breast pump type: Double-Electric Breast Pump Pump Education: Milk Storage Reason for Pumping: LPI/twins Pumping frequency: Q3 hrs  Interventions    Discharge    Consult Status Consult Status: Follow-up Date: 08/11/20 Follow-up type: In-patient    Charyl Dancer 08/11/2020, 6:55 AM

## 2020-08-11 NOTE — Discharge Summary (Signed)
Postpartum Discharge Summary  Date of Service updated: 08/13/20     Patient Name: Christina Conley DOB: 04/05/1995 MRN: [id number]  Date of admission: 08/10/2020 Delivery date:   Caitrin, Pendergraph [[id number]]  08/11/2020    Tymara, Saur [[id number]]  08/11/2020  Delivering provider:    Tonette Lederer [[id number]]  AYDIN, CAVALIERI, BoyB Franchot Gallo [[id number]]  Corliss Blacker C  Date of discharge: 08/13/2020  Admitting diagnosis: Monochorionic diamniotic twin gestation [O30.039] Intrauterine pregnancy: [redacted]w[redacted]d    Secondary diagnosis:  Active Problems:   Supervision of high-risk pregnancy   Monochorionic diamniotic twin gestation   Anemia of pregnancy   Vaginal delivery   Acute blood loss anemia  Additional problems: none    Discharge diagnosis: Term Pregnancy Delivered and Anemia                                              Post partum procedures:blood transfusion and IV iron, venofer Augmentation: AROM, Pitocin, and Cytotec Complications: None  Hospital course: Induction of Labor With Vaginal Delivery   25y.o. yo G3P1011 at 326w1das admitted to the hospital 08/10/2020 for induction of labor.  Indication for induction:  twin gestation .  Patient had an uncomplicated labor course as follows: Membrane Rupture Time/Date:    NeMikhayla, Phillis0[[id number]]7:18 PM    NeClaud Kelp0[[id number]]12:55 AM ,   NeJilliam, Bellmore0[[id number]]08/10/2020    NeTed, Leonhart0[[id number]]08/11/2020   Delivery Method:   NeTonette Lederer0[[id number]]Vaginal, Spontaneous    NeElga, SantyeClifton0[[id number]]Vaginal, Spontaneous  Episiotomy:    NePatrica, Mendell0[[id number]]None    NeAsenath, BalasheSextonville0[[id number]]None  Lacerations:     NeShirel, Mallis0[[id number]]None    NeKinsleigh, Ludolph0[[id number]]None  Details of delivery can be found in separate delivery note.  Patient had a routine postpartum course. Patient is  discharged home 08/13/20.  Newborn Data: Birth date:   NeKelsha, Older0[[id number]]08/11/2020    NeAmelita, Risinger0[[id number]]08/11/2020  Birth time:   NeJorie, Zee0[[id number]]12:53 AM    NeIvin PooteRiverview0[[id number]]1:02 AM  Gender:   NeZariyah, Stephens0[[id number]]Female    NeClaudie, BrickhouseeMendota0[[id number]]Female  Living status:   NeCelestial, Barnfield0[[id number]]Living    NeJo-Ann, JohanningeKemp0[[id number]]Living  Apgars:   NeCheralyn, Oliver0[[id number]]9 [address][[id number]]9 Lesleigh, Hughson0[[id number]]9 [address] [place][[id number]]9  Weight:   NeTakyia, Sindt0[[id number]]  0355    NeAwa, Bachicha0[[id number]]2449 g   Magnesium Sulfate received: No BMZ received: No Rhophylac:N/A MMR:N/A T-DaP: n/a Flu: N/A Transfusion:Yes  Physical exam  Vitals:   08/12/20 1248 08/12/20 1738 08/12/20 2128 08/13/20 0657  BP: 118/81 127/86 116/69 (!) 121/53  Pulse: 77 89 72 82  Resp: _0 Temp: 98.1 F (36.7 C) 99 F (37.2 C) 98.8 F (37.1 C) 98.3 F (36.8 C)  TempSrc: Oral Oral  Oral  SpO2: 99% 100% 100% 100%  Weight:  Height:       General: alert, cooperative, and no distress Lochia: appropriate Uterine Fundus: firm Incision: N/A DVT Evaluation: Negative Homan's sign. No cords or calf tenderness. No significant calf/ankle edema. Labs: Lab Results  Component Value Date   WBC 9.7 08/13/2020   HGB 7.4 (L) 08/13/2020   HCT 23.5 (L) 08/13/2020   MCV 78.6 (L) 08/13/2020   PLT 243 08/13/2020   CMP Latest Ref Rng & Units 02/02/2020  Glucose 70 - 99 mg/dL 84  BUN 6 - 20 mg/dL 11  Creatinine 0.44 - 1.00 mg/dL 0.67  Sodium 135 - 145 mmol/L 133(L)  Potassium 3.5 - 5.1 mmol/L 3.5  Chloride 98 - 111 mmol/L 102  CO2 22 - 32 mmol/L 24  Calcium 8.9 - 10.3 mg/dL 9.3  Total Protein 6.5 - 8.1 g/dL 7.5  Total Bilirubin 0.3 - 1.2 mg/dL 0.2(L)  Alkaline Phos 38 - 126 U/L 53  AST 15 - 41  U/L 15  ALT 0 - 44 U/L 13   Edinburgh Score: No flowsheet data found.   After visit meds:  Allergies as of 08/13/2020       Reactions   Amoxicillin Hives   Ampicillin Hives   Penicillins Hives   Has patient had a PCN reaction causing immediate rash, facial/tongue/throat swelling, SOB or lightheadedness with hypotension: Yes Has patient had a PCN reaction causing severe rash involving mucus membranes or skin necrosis: No Has patient had a PCN reaction that required hospitalization No Has patient had a PCN reaction occurring within the last 10 years: unknown If all of the above answers are "NO", then may proceed with Cephalosporin use.        Medication List     STOP taking these medications    acetaminophen 500 MG tablet Commonly known as: TYLENOL   aspirin 81 MG chewable tablet   butalbital-acetaminophen-caffeine 50-325-40 MG tablet Commonly known as: FIORICET   omeprazole 20 MG capsule Commonly known as: PRILOSEC   ondansetron 8 MG disintegrating tablet Commonly known as: Zofran ODT       TAKE these medications    ascorbic acid 250 MG tablet Commonly known as: VITAMIN C Take 1 tablet (250 mg total) by mouth every other day.   Blood Pressure Monitor Misc For regular home bp monitoring during pregnancy   ferrous sulfate 325 (65 FE) MG tablet Take 1 tablet (325 mg total) by mouth every other day.   ibuprofen 600 MG tablet Commonly known as: ADVIL Take 1 tablet (600 mg total) by mouth every 6 (six) hours.   PRENATAL VITAMIN PO Take by mouth.         Discharge home in stable condition Infant Feeding: Breast Infant Disposition:rooming in Discharge instruction: per After Visit Summary and Postpartum booklet. Activity: Advance as tolerated. Pelvic rest for 6 weeks.  Diet: routine diet Future Appointments: Future Appointments  Date Time Provider [place]  09/17/2020 10:50 AM Janyth Pupa, DO [facility]   Follow up Visit:  Follow-up  Information     [facility]. Schedule an appointment as soon as possible for a visit in 4 week(s).   Specialty: Obstetrics and Gynecology Why: postpartum visit, possible outpatient nexplanon Contact information: [address] [place] [phone]               Message sent to [facility] 08/11/20 by Sylvester Harder.   Please schedule this patient for a In person postpartum visit in 6 weeks with the following provider: Any  provider. Additional Postpartum F/U: none   High risk pregnancy complicated by:  twin gestation Delivery mode:     Eilis, Chestnutt [[id number]]  Vaginal, Spontaneous    Myanna, Ziesmer [[id number]]  Vaginal, Spontaneous  Anticipated Birth Control:   probable outpatient nexplanon   08/13/2020 Griffin Basil, MD

## 2020-08-11 NOTE — Lactation Note (Signed)
This note was copied from a baby's chart. Lactation Consultation Note Mom called for latch assistance. Baby B roots but when place on breast will only suckle a time or two lightly. Doesn't appear to be interested in BF. Supplement given Similac 22 cal. LC had to really work w/baby to get him to take 15 ml formula.  Encouraged mom to pump every 3 hrs then hand express. Reminded mom to alternate breast w/babies. Reviewed yesterday but mom didn't remember.  Patient Name: Christina Conley KAJGO'T Date: 08/11/2020 Reason for consult: Follow-up assessment;Late-preterm 34-36.6wks;Multiple gestation;Infant < 6lbs Age:62 hours  Maternal Data    Feeding Nipple Type: Nfant Slow Flow (purple)  LATCH Score Latch: Too sleepy or reluctant, no latch achieved, no sucking elicited.  Audible Swallowing: None  Type of Nipple: Everted at rest and after stimulation  Comfort (Breast/Nipple): Soft / non-tender  Hold (Positioning): Assistance needed to correctly position infant at breast and maintain latch.  LATCH Score: 5   Lactation Tools Discussed/Used    Interventions Interventions: Breast feeding basics reviewed;Support pillows;Assisted with latch;Position options;Breast massage;Adjust position;Breast compression  Discharge WIC Program: Yes  Consult Status Consult Status: Follow-up Date: 08/12/20 Follow-up type: In-patient    Charyl Dancer 08/11/2020, 10:35 PM

## 2020-08-11 NOTE — Anesthesia Postprocedure Evaluation (Signed)
Anesthesia Post Note  Patient: Christina Conley  Procedure(s) Performed: AN AD HOC LABOR EPIDURAL     Patient location during evaluation: Mother Baby Anesthesia Type: Epidural Level of consciousness: awake and alert and oriented Pain management: satisfactory to patient Vital Signs Assessment: post-procedure vital signs reviewed and stable Respiratory status: respiratory function stable Cardiovascular status: stable Postop Assessment: no headache, no backache, epidural receding, patient able to bend at knees, no signs of nausea or vomiting, adequate PO intake and able to ambulate Anesthetic complications: no   No notable events documented.  Last Vitals:  Vitals:   08/11/20 0338 08/11/20 0434  BP: 124/82 122/83  Pulse: 82 90  Resp: 20 18  Temp: 36.9 C 37.1 C  SpO2: 100% 100%    Last Pain:  Vitals:   08/11/20 0534  TempSrc:   PainSc: 0-No pain   Pain Goal:                   Canaan Prue

## 2020-08-11 NOTE — Plan of Care (Signed)

## 2020-08-11 NOTE — Lactation Note (Signed)
This note was copied from a baby's chart. Lactation Consultation Note Baby B is BF great as well as A. Mom BF STS in her gown covered up. Latched well. Rooting maintaining latch. Encouraged mom to occasionally swaddle. Encouraged to call for feeding assistance. Discussed pumping and supplementing w/mom.   Patient Name: Christina Conley QHUTM'L Date: 08/11/2020 Reason for consult: L&D Initial assessment;Multiple gestation;Late-preterm 34-36.6wks Age:24 hours  Maternal Data    Feeding    LATCH Score Latch: Grasps breast easily, tongue down, lips flanged, rhythmical sucking.  Audible Swallowing: A few with stimulation  Type of Nipple: Everted at rest and after stimulation  Comfort (Breast/Nipple): Soft / non-tender  Hold (Positioning): Assistance needed to correctly position infant at breast and maintain latch.  LATCH Score: 8   Lactation Tools Discussed/Used    Interventions Interventions: Breast feeding basics reviewed;Adjust position;Assisted with latch;Support pillows;Skin to skin;Breast massage;Breast compression  Discharge    Consult Status Consult Status: Follow-up Date: 08/11/20 Follow-up type: In-patient    Mahasin Riviere, Diamond Nickel 08/11/2020, 2:27 AM

## 2020-08-12 DIAGNOSIS — D62 Acute posthemorrhagic anemia: Secondary | ICD-10-CM

## 2020-08-12 HISTORY — DX: Acute posthemorrhagic anemia: D62

## 2020-08-12 LAB — CBC
HCT: 21.8 % — ABNORMAL LOW (ref 36.0–46.0)
Hemoglobin: 6.8 g/dL — CL (ref 12.0–15.0)
MCH: 24.6 pg — ABNORMAL LOW (ref 26.0–34.0)
MCHC: 31.2 g/dL (ref 30.0–36.0)
MCV: 79 fL — ABNORMAL LOW (ref 80.0–100.0)
Platelets: 245 10*3/uL (ref 150–400)
RBC: 2.76 MIL/uL — ABNORMAL LOW (ref 3.87–5.11)
RDW: 17 % — ABNORMAL HIGH (ref 11.5–15.5)
WBC: 10.4 10*3/uL (ref 4.0–10.5)
nRBC: 0 % (ref 0.0–0.2)

## 2020-08-12 LAB — PREPARE RBC (CROSSMATCH)

## 2020-08-12 LAB — CULTURE, BETA STREP (GROUP B ONLY)

## 2020-08-12 MED ORDER — SODIUM CHLORIDE 0.9% IV SOLUTION: Freq: Once | INTRAVENOUS | Status: AC

## 2020-08-12 MED ORDER — DIPHENHYDRAMINE HCL 50 MG/ML IJ SOLN
25.0000 mg | Freq: Once | INTRAMUSCULAR | Status: DC
  Filled 2020-08-12: qty 1

## 2020-08-12 NOTE — Progress Notes (Signed)
Post Partum Day 1 Subjective:  Patient is doing well without complaints. Ambulating without difficulty. Voiding and passing flatus. Tolerating PO. Abdominal pain improved. Vaginal bleeding decreased. Denies lightheadedness or dizziness.  Objective: Blood pressure 119/81, pulse 79, temperature 97.6 F (36.4 C), temperature source Oral, resp. rate 18, height 5\' 11"  (1.803 m), weight 110.4 kg, last menstrual period 12/08/2019, SpO2 100 %, unknown if currently breastfeeding.  Physical Exam:  General: alert, cooperative, and no distress Lochia: appropriate Uterine Fundus: firm Incision: n/a DVT Evaluation: No evidence of DVT seen on physical exam.  Recent Labs    08/11/20 0612 08/12/20 0516  HGB 7.0* 6.8*  HCT 22.7* 21.8*    Assessment/Plan: PPD#1  -doing well, meeting pp milestones  -VSS  -undecided on contraception, counseled  -desires circs for babies, consented  #Acute blood loss anemia  -EBL 08/14/20  -PO and IV iron ordered yesterday  -will administer 1 U pRBCs, hgb to 6.8 today  -asymptomatic  Plan for discharge tomorrow.   LOS: 2 days   08/12/2020, 7:42 AM

## 2020-08-13 LAB — BPAM RBC
Blood Product Expiration Date: 202207162359
ISSUE DATE / TIME: 202206220806
Unit Type and Rh: 5100

## 2020-08-13 LAB — TYPE AND SCREEN
ABO/RH(D): O POS
Antibody Screen: NEGATIVE
Unit division: 0

## 2020-08-13 LAB — CBC
HCT: 23.5 % — ABNORMAL LOW (ref 36.0–46.0)
Hemoglobin: 7.4 g/dL — ABNORMAL LOW (ref 12.0–15.0)
MCH: 24.7 pg — ABNORMAL LOW (ref 26.0–34.0)
MCHC: 31.5 g/dL (ref 30.0–36.0)
MCV: 78.6 fL — ABNORMAL LOW (ref 80.0–100.0)
Platelets: 243 10*3/uL (ref 150–400)
RBC: 2.99 MIL/uL — ABNORMAL LOW (ref 3.87–5.11)
RDW: 16.6 % — ABNORMAL HIGH (ref 11.5–15.5)
WBC: 9.7 10*3/uL (ref 4.0–10.5)
nRBC: 0 % (ref 0.0–0.2)

## 2020-08-13 MED ORDER — IBUPROFEN 600 MG PO TABS: 600.0000 mg | ORAL_TABLET | Freq: Four times a day (QID) | ORAL | 1 refills | Status: DC

## 2020-08-13 MED ORDER — ASCORBIC ACID 250 MG PO TABS: 250.0000 mg | ORAL_TABLET | ORAL | 0 refills | Status: AC

## 2020-08-13 NOTE — Lactation Note (Signed)
This note was copied from a baby's chart. Lactation Consultation Note Mom stated she pumped and got 10 ml colostrum. Mom stated it's the most she has gotten. Mom is putting babies to the breast occasionally. Mom is giving formula as well. Gave mom sheet of how much to give if formula feeding and not breast and formula.  It has both actually but mom is to go by the LPI sheet given on admission if going to the breast. Encouraged mom to cont. To document I&O. Mom is doing her way, mom stated she is going to work BF into the feeding plan as they get bigger.  Patient Name: Christina Conley TOIZT'I Date: 08/13/2020 Reason for consult: Follow-up assessment;Late-preterm 34-36.6wks;Multiple gestation Age:72 hours  Maternal Data    Feeding Mother's Current Feeding Choice: Breast Milk and Formula Nipple Type: Extra Slow Flow  LATCH Score                    Lactation Tools Discussed/Used    Interventions    Discharge    Consult Status Consult Status: Follow-up Date: 08/13/20 Follow-up type: In-patient    Charyl Dancer 08/13/2020, 1:44 AM

## 2020-08-13 NOTE — Progress Notes (Signed)
POSTPARTUM PROGRESS NOTE  Post Partum Day 2  Subjective:  Christina Conley is a 25 y.o. [phone] s/p vaginal delivery of twins at [redacted]w[redacted]d.  She reports she is doing well. No acute events overnight. She denies any problems with ambulating, voiding or po intake. Denies nausea or vomiting.  Pain is well controlled.  Lochia is minimal per pt.  The patient is s/p venofer and 1 unit PRBC.  She deneies feeling light headed or dizzy s/p transfusion.  Objective: Blood pressure (!) 121/53, pulse 82, temperature 98.3 F (36.8 C), temperature source Oral, resp. rate 18, height 5\' 11"  (1.803 m), weight 110.4 kg, last menstrual period 12/08/2019, SpO2 100 %, unknown if currently breastfeeding.  Physical Exam:  General: alert, cooperative and no distress Chest: no respiratory distress Heart:regular rate, distal pulses intact Abdomen: soft, nontender,  Uterine Fundus: firm, appropriately tender DVT Evaluation: No calf swelling or tenderness Extremities: minimal edema Skin: warm, dry  Recent Labs    08/12/20 0516 08/13/20 0520  HGB 6.8* 7.4*  HCT 21.8* 23.5*    Assessment/Plan: Christina Conley is a 25 y.o. [phone] s/p vaginal delivery of twins at [redacted]w[redacted]d   PPD#2 - Doing well Routine postpartum care Contraception: outpatient nexplanon Feeding: breast Dispo: Anticipate discharge today   LOS: 3 days   [redacted]w[redacted]d, MD Faculty attending 08/13/2020, 8:40 AM

## 2020-08-14 ENCOUNTER — Telehealth: Payer: Self-pay | Admitting: *Deleted

## 2020-08-14 LAB — SURGICAL PATHOLOGY

## 2020-08-14 NOTE — Telephone Encounter (Signed)
Transition Care Management Follow-up Telephone Call Date of discharge and from where: 08/13/2020 - [facility] How have you been since you were released from the hospital? "I am doing good" Any questions or concerns? No  Items Reviewed: Did the pt receive and understand the discharge instructions provided? Yes  Medications obtained and verified? Yes  Other? No  Any new allergies since your discharge? No  Dietary orders reviewed? No Do you have support at home? Yes    Functional Questionnaire: (I = Independent and D = Dependent) ADLs: I  Bathing/Dressing- I  Meal Prep- I  Eating- I  Maintaining continence- I  Transferring/Ambulation- I  Managing Meds- I  Follow up appointments reviewed:  PCP Hospital f/u appt confirmed? No   Specialist Hospital f/u appt confirmed? Yes  Scheduled to see OBGYN on 09/17/2020 @ 1050. Are transportation arrangements needed? No  If their condition worsens, is the pt aware to call PCP or go to the Emergency Dept.? Yes Was the patient provided with contact information for the PCP's office or ED? Yes Was to pt encouraged to call back with questions or concerns? Yes

## 2020-08-18 ENCOUNTER — Encounter: Payer: Medicaid Other | Admitting: Obstetrics & Gynecology

## 2020-08-18 ENCOUNTER — Other Ambulatory Visit: Payer: Medicaid Other

## 2020-08-25 ENCOUNTER — Encounter: Payer: Medicaid Other | Admitting: Obstetrics & Gynecology

## 2020-08-25 ENCOUNTER — Other Ambulatory Visit: Payer: Medicaid Other

## 2020-09-17 ENCOUNTER — Ambulatory Visit (INDEPENDENT_AMBULATORY_CARE_PROVIDER_SITE_OTHER): Payer: Medicaid Other | Admitting: Obstetrics & Gynecology

## 2020-09-17 ENCOUNTER — Other Ambulatory Visit: Payer: Self-pay

## 2020-09-17 ENCOUNTER — Encounter: Payer: Self-pay | Admitting: Obstetrics & Gynecology

## 2020-09-17 ENCOUNTER — Other Ambulatory Visit (HOSPITAL_COMMUNITY)
Admission: RE | Admit: 2020-09-17 | Discharge: 2020-09-17 | Disposition: A | Discharge status: Home / Self Care | Payer: Medicaid Other | Source: Ambulatory Visit | Attending: Obstetrics & Gynecology | Admitting: Obstetrics & Gynecology

## 2020-09-17 DIAGNOSIS — Z3202 Encounter for pregnancy test, result negative: Secondary | ICD-10-CM | POA: Diagnosis not present

## 2020-09-17 DIAGNOSIS — Z30011 Encounter for initial prescription of contraceptive pills: Secondary | ICD-10-CM | POA: Diagnosis not present

## 2020-09-17 DIAGNOSIS — Z124 Encounter for screening for malignant neoplasm of cervix: Secondary | ICD-10-CM | POA: Insufficient documentation

## 2020-09-17 LAB — POCT URINE PREGNANCY: Preg Test, Ur: NEGATIVE

## 2020-09-17 MED ORDER — NORETHINDRONE 0.35 MG PO TABS: 1.0000 | ORAL_TABLET | Freq: Every day | ORAL | 4 refills | Status: DC

## 2020-09-17 NOTE — Progress Notes (Signed)
POSTPARTUM VISIT Patient name: Christina Conley MRN [id number]  Date of birth: 10/31/1995 Chief Complaint:   Postpartum Care and talk contraceptive (Had sex week ago)  History of Present Illness:   Christina Conley is a 25 y.o. [phone] female being seen today for a postpartum visit. She is 5 weeks postpartum following a spontaneous vaginal delivery of twins at 35w1dgestational weeks. IOL: Yes, for  mono/di twins .   Pregnancy complicated by mono/di twins, iron def. anemia .  Last pap smear: []  needs Pap    Postpartum course has been uncomplicated.  Bleeding no bleeding stopped after 1 wk- currently on menses.  Bowel function is normal. Bladder function is normal. Urinary incontinence? No, fecal incontinence? No Patient is sexually active. Last sexual activity: not sure maybe a week ago.   Desired contraception:  considering Nexplanon- however fearful of needles . Patient does not want a pregnancy in the future.     Upstream - 09/17/20 1121       Pregnancy Intention Screening   Does the patient want to become pregnant in the next year? No    Does the patient's partner want to become pregnant in the next year? No    Would the patient like to discuss contraceptive options today? Yes      Contraception Wrap Up   Current Method No Contraceptive Precautions    End Method --    Contraception Counseling Provided Yes            The pregnancy intention screening data noted above was reviewed. Potential methods of contraception were discussed. The patient elected to proceed with No data recorded.  Edinburgh Postpartum Depression Screening: Negative  Edinburgh Postnatal Depression Scale - 09/17/20 1120       Edinburgh Postnatal Depression Scale:  In the Past 7 Days   I have been able to laugh and see the funny side of things. 0    I have looked forward with enjoyment to things. 0    I have blamed myself unnecessarily when things went wrong. 1    I have been anxious or worried for no  good reason. 1    I have felt scared or panicky for no good reason. 0    Things have been getting on top of me. 2    I have been so unhappy that I have had difficulty sleeping. 2    I have felt sad or miserable. 1    I have been so unhappy that I have been crying. 1    The thought of harming myself has occurred to me. 0    Edinburgh Postnatal Depression Scale Total 8             Baby's course has been uncomplicated. Babies are breast and bottle feeding.  Infant has a pediatrician/family doctor? Yes.  Childcare strategy if returning to work/school: Mom is going to stay at home.  Pt has material needs met for her and baby: Yes.    Review of Systems:   Pertinent items are noted in HPI Denies Abnormal vaginal discharge w/ itching/odor/irritation, headaches, visual changes, shortness of breath, chest pain, abdominal pain, severe nausea/vomiting, or problems with urination or bowel movements. Pertinent History Reviewed:  Reviewed past medical,surgical, obstetrical and family history.  Reviewed problem list, medications and allergies. OB History  Gravida Para Term Preterm AB Living  3 2 1 1 1 3   SAB IAB Ectopic Multiple Live Births  1     1 3     #  Outcome Date GA Lbr Len/2nd Weight Sex Delivery Anes PTL Lv  3A Preterm 08/11/20 3w1d02:04 / 00:27 5 lb 13.3 oz (2.645 kg) M Vag-Spont EPI  LIV  3B Preterm 08/11/20 368w1d2:04 / 00:36 5 lb 6.4 oz (2.449 kg) M Vag-Spont EPI  LIV  2 Term 07/10/14 3850w4d:07 / 00:18 7 lb 11.6 oz (3.505 kg) F Vag-Spont EPI  LIV  1 SAB            Physical Assessment:   Vitals:   09/17/20 1111  BP: 122/79  Pulse: 91  Weight: 208 lb (94.3 kg)  Height: 5' 7"  (1.702 m)  Body mass index is 32.58 kg/m.       Physical Examination:   General appearance: alert, well appearing, and in no distress  Mental status: alert, oriented to person, place, and time  Skin: warm & dry   Cardiovascular: normal heart rate noted   Respiratory: normal respiratory effort, no  distress   Breasts: deferred, no complaints   Abdomen: soft, non-tender   Pelvic: VULVA: normal appearing vulva with no masses, tenderness or lesions, VAGINA: normal appearing vagina with normal color and discharge, no lesions, CERVIX: normal appearing cervix without discharge or lesions, UTERUS: uterus is normal size, shape, consistency and nontender, ADNEXA: normal adnexa in size, nontender and no masses  Extremities: no edema  Chaperone: LatAlice Rieger      Results for orders placed or performed in visit on 09/17/20 (from the past 24 hour(s))  POCT urine pregnancy   Collection Time: 09/17/20 11:29 AM  Result Value Ref Range   Preg Test, Ur Negative Negative    Assessment & Plan:  1) Postpartum exam 2) 36 wks s/p spontaneous vaginal delivery x 2 3) breast & bottle feeding 4) Depression screening 5) Contraception counseling - Reviewed all options.  pt signed consent for Nexplanon.  However, she was not able to have it placed due to fear of needles.  For now plan for POPs and may reconsider or try placement on another day.  Essential components of care per ACOG recommendations:  1.  Mood and well being:  If positive depression screen, discussed and plan developed.  If using tobacco we discussed reduction/cessation and risk of relapse If current substance abuse, we discussed and referral to local resources was offered.   2. Infant care and feeding:  If breastfeeding, discussed returning to work, pumping, breastfeeding-associated pain, guidance regarding return to fertility while lactating if not using another method Recommended that all caregivers be immunized for flu, pertussis and other preventable communicable diseases  3. Sexuality, contraception and birth spacing Provided guidance regarding sexuality, management of dyspareunia, and resumption of intercourse Discussed avoiding interpregnancy interval <6mt51mand recommended birth spacing of 18 months  4. Sleep and  fatigue Discussed coping options for fatigue and sleep disruption Encouraged family/partner/community support of 4 hrs of uninterrupted sleep to help with mood and fatigue  5. Physical recovery  If pt had a C/S, assessed incisional pain and providing guidance on normal vs prolonged recovery If pt had a laceration, perineal healing and pain reviewed.  If urinary or fecal incontinence, discussed management and referred to PT or uro/gyn if indicated  Patient is safe to resume physical activity. Discussed attainment of healthy weight.  6. Health maintenance Mammogram at 40yo9yoearlier if indicated Pap smear collected  Meds:  Meds ordered this encounter  Medications   norethindrone (MICRONOR) 0.35 MG tablet    Sig: Take 1 tablet (0.35 mg  total) by mouth daily.    Dispense:  90 tablet    Refill:  4     Follow-up: Return in about 1 year (around 09/17/2021), or for contraception, for Annual.   Orders Placed This Encounter  Procedures   POCT urine pregnancy    Janyth Pupa, DO Attending [place], [place] for [facility], [place]

## 2020-09-18 LAB — CYTOLOGY - PAP: Diagnosis: NEGATIVE

## 2020-10-19 ENCOUNTER — Ambulatory Visit: Payer: Medicaid Other | Admitting: Obstetrics & Gynecology

## 2020-10-21 ENCOUNTER — Ambulatory Visit: Payer: Medicaid Other | Admitting: Women's Health

## 2020-10-21 ENCOUNTER — Other Ambulatory Visit: Payer: Self-pay

## 2020-10-21 ENCOUNTER — Encounter: Payer: Self-pay | Admitting: Women's Health

## 2020-10-21 VITALS — BP 111/75 | HR 79 | Ht 71.0 in | Wt 211.0 lb

## 2020-10-21 DIAGNOSIS — K429 Umbilical hernia without obstruction or gangrene: Secondary | ICD-10-CM | POA: Diagnosis not present

## 2020-10-21 NOTE — Progress Notes (Signed)
   GYN VISIT Patient name: Christina Conley MRN [id number]  Date of birth: [birth year] Chief Complaint:   Umbilical Hernia  History of Present Illness:   Christina Conley is a 25 y.o. [phone] African-American female s/p SVB of twins, being seen today for umbilical hernia.  States she developed it during pregnancy, hasn't improved, is intermittently painful. Breast & bottlefeeding.     No LMP recorded. (Menstrual status: Lactating). The current method of family planning is oral progesterone-only contraceptive.  Last pap 09/17/20. Results were: NILM w/ HRHPV not done  Depression screen [facility] 2/9 02/26/2020 01/14/2020 10/15/2012 10/15/2012  Decreased Interest 3 3 0 0  Down, Depressed, Hopeless 1 0 1 1  PHQ - 2 Score 4 3 1 1   Altered sleeping 3 3 2 2   Tired, decreased energy 3 3 1  -  Change in appetite 2 3 2  -  Feeling bad or failure about yourself  0 1 1 -  Trouble concentrating 0 0 0 -  Moving slowly or fidgety/restless 0 0 0 -  Suicidal thoughts 0 0 0 -  PHQ-9 Score 12 13 7 3      GAD 7 : Generalized Anxiety Score 02/26/2020 01/14/2020  Nervous, Anxious, on Edge 1 2  Control/stop worrying 2 1  Worry too much - different things 2 3  Trouble relaxing 3 1  Restless 0 0  Easily annoyed or irritable 2 3  Afraid - awful might happen 0 2  Total GAD 7 Score 10 12     Review of Systems:   Pertinent items are noted in HPI Denies fever/chills, dizziness, headaches, visual disturbances, fatigue, shortness of breath, chest pain, abdominal pain, vomiting, abnormal vaginal discharge/itching/odor/irritation, problems with periods, bowel movements, urination, or intercourse unless otherwise stated above.  Pertinent History Reviewed:  Reviewed past medical,surgical, social, obstetrical and family history.  Reviewed problem list, medications and allergies. Physical Assessment:   Vitals:   10/21/20 1006  BP: 111/75  Pulse: 79  Weight: 211 lb (95.7 kg)  Height: 5\' 11"  (1.803 m)  Body mass index is  29.43 kg/m.       Physical Examination:   General appearance: alert, well appearing, and in no distress  Mental status: alert, oriented to person, place, and time  Skin: warm & dry   Cardiovascular: normal heart rate noted  Respiratory: normal respiratory effort, no distress  Abdomen: soft, non-tender, small reducible umbilical hernia  Pelvic: examination not indicated  Extremities: no edema   Chaperone: N/A    No results found for this or any previous visit (from the past 24 hour(s)).  Assessment & Plan:  1) Small reducible umbilical hernia w/ intermittent pain> referral to general surgery entered  2) s/p SVB of twins> breast & bottlefeeding  Meds: No orders of the defined types were placed in this encounter.   Orders Placed This Encounter  Procedures   Ambulatory referral to General Surgery    Return in about 1 year (around 10/21/2021) for Physical.  04/25/2020 CNM, WHNP-BC 10/21/2020 10:29 AM

## 2020-10-28 ENCOUNTER — Other Ambulatory Visit: Payer: Self-pay

## 2020-10-28 ENCOUNTER — Ambulatory Visit: Payer: Medicaid Other | Admitting: Internal Medicine

## 2020-10-28 ENCOUNTER — Encounter: Payer: Self-pay | Admitting: Internal Medicine

## 2020-10-28 VITALS — BP 118/70 | HR 83 | Temp 98.2°F | Resp 18 | Ht 71.0 in | Wt 214.0 lb

## 2020-10-28 DIAGNOSIS — R102 Pelvic and perineal pain unspecified side: Secondary | ICD-10-CM | POA: Insufficient documentation

## 2020-10-28 DIAGNOSIS — D509 Iron deficiency anemia, unspecified: Secondary | ICD-10-CM | POA: Diagnosis not present

## 2020-10-28 DIAGNOSIS — Z7689 Persons encountering health services in other specified circumstances: Secondary | ICD-10-CM | POA: Insufficient documentation

## 2020-10-28 DIAGNOSIS — K429 Umbilical hernia without obstruction or gangrene: Secondary | ICD-10-CM

## 2020-10-28 DIAGNOSIS — Z2821 Immunization not carried out because of patient refusal: Secondary | ICD-10-CM | POA: Diagnosis not present

## 2020-10-28 NOTE — Assessment & Plan Note (Signed)
Usually benign Advised to avoid heavy lifting, but she has 3 children and is unable to avoid straining Going to see General Surgeon

## 2020-10-28 NOTE — Assessment & Plan Note (Signed)
Had PRBC and Venofer after delivery Check CBC today Advised to start taking iron supplements

## 2020-10-28 NOTE — Assessment & Plan Note (Signed)
Care established History and medications reviewed with the patient 

## 2020-10-28 NOTE — Patient Instructions (Signed)
Please start taking ferrous sulfate 325 mg once daily.  You are being scheduled to get [place] of pelvis. Please continue to follow up with Ob/Gyn.  Please consider getting COVID and flu vaccine.  Please follow up with General Surgeon for umbilical hernia.

## 2020-10-28 NOTE — Assessment & Plan Note (Signed)
States that she has pelvic pain since starting OCPs Has h/o ovarian cyst in the past Check US pelvis F/u with Ob/Gyn

## 2020-10-28 NOTE — Progress Notes (Signed)
New Patient Office Visit  Subjective:  Patient ID: Christina Conley, female    DOB: 1995-03-08  Age: 24 y.o. MRN: [id number]  CC:  Chief Complaint  Patient presents with   New Patient (Initial Visit)    New patient she has hernia that popped up around February near belly button also pt has been having pain in ovaries since starting birth control 09-17-20    HPI Christina Conley is a 25 y.o. female with PMH of iron deficiency anemia and umbilical hernia who presents for establishing care.  She had post- delivery iron deficiency anemia, which required 1 unit of PRBC and Venofer transfusion in the hospital.  Her last CBC showed Hb of 7.4.  She denies any fatigue, dizziness or dyspnea currently.  She reports having a bulging around her umbilicus that she noticed during her pregnancy.  She was advised to avoid straining and heavy lifting.  She is going to see Dr. Henreitta Leber.  She denies any nausea, vomiting, abdominal pain, diarrhea, melena or hematochezia.  She reports having b/l pelvic pain since starting OCPs after delivery.  She reports having similar pain in the past when she took OCPs.  She reports history of ovarian cysts.  Denies any vaginal discharge, dysuria or hematuria currently.  She has not had COVID-vaccine yet.  She denied Tdap vaccine even during her pregnancy.   Past Medical History:  Diagnosis Date   Acute blood loss anemia 08/12/2020   EBL with delivery PO iron, IV venofer administered postpartum 1 U pRBCs Patient asymptomatic   Asthma    child.  No meds > 2 years   BV (bacterial vaginosis) 07/02/2012   Contraceptive management 05/27/2015   Environmental allergies    Gonorrhea 08/2015   Irregular intermenstrual bleeding 05/27/2015   LLQ pain 05/27/2015   Round ligament pain 12/09/2013   Vaginal discharge 12/09/2013    Past Surgical History:  Procedure Laterality Date   NO PAST SURGERIES      Family History  Problem Relation Age of Onset   Hypertension Maternal  Grandfather    Hyperlipidemia Maternal Grandfather    Crohn's disease Mother    Other Father        problems with intestines   Crohn's disease Paternal Grandmother    Hypertension Maternal Grandmother    Miscarriages / Stillbirths Maternal Grandmother     Social History   Socioeconomic History   Marital status: Single    Spouse name: Not on file   Number of children: Not on file   Years of education: Not on file   Highest education level: Not on file  Occupational History   Not on file  Tobacco Use   Smoking status: Former    Packs/day: 0.25    Years: 0.50    Pack years: 0.13    Types: Cigarettes   Smokeless tobacco: Never  Vaping Use   Vaping Use: Never used  Substance and Sexual Activity   Alcohol use: No   Drug use: No    Types: Marijuana    Comment: quit 2015   Sexual activity: Yes    Birth control/protection: None  Other Topics Concern   Not on file  Social History Narrative   Not on file   Social Determinants of Health   Financial Resource Strain: Low Risk    Difficulty of Paying Living Expenses: Not hard at all  Food Insecurity: No Food Insecurity   Worried About Radiation protection practitioner of Food in the Last Year: Never true  Ran Out of Food in the Last Year: Never true  Transportation Needs: No Transportation Needs   Lack of Transportation (Medical): No   Lack of Transportation (Non-Medical): No  Physical Activity: Inactive   Days of Exercise per Week: 0 days   Minutes of Exercise per Session: 0 min  Stress: Stress Concern Present   Feeling of Stress : Rather much  Social Connections: Moderately Integrated   Frequency of Communication with Friends and Family: More than three times a week   Frequency of Social Gatherings with Friends and Family: Never   Attends Religious Services: More than 4 times per year   Active Member of [employer] or Organizations: No   Attends Banker Meetings: Never   Marital Status: Living with partner  Intimate Partner  Violence: Not At Risk   Fear of Current or Ex-Partner: No   Emotionally Abused: No   Physically Abused: No   Sexually Abused: No    ROS Review of Systems  Constitutional:  Negative for chills and fever.  HENT:  Negative for congestion, sinus pressure, sinus pain and sore throat.   Eyes:  Negative for pain and discharge.  Respiratory:  Negative for cough and shortness of breath.   Cardiovascular:  Negative for chest pain and palpitations.  Gastrointestinal:  Negative for abdominal pain, constipation, diarrhea, nausea and vomiting.  Endocrine: Negative for polydipsia and polyuria.  Genitourinary:  Positive for pelvic pain. Negative for dysuria and hematuria.  Musculoskeletal:  Negative for neck pain and neck stiffness.  Skin:  Negative for rash.  Neurological:  Negative for dizziness and weakness.  Psychiatric/Behavioral:  Negative for agitation and behavioral problems.    Objective:   Today's Vitals: BP 118/70 (BP Location: Left Arm, Patient Position: Sitting, Cuff Size: Normal)   Pulse 83   Temp 98.2 F (36.8 C) (Oral)   Resp 18   Ht 5\' 11"  (1.803 m)   Wt 214 lb (97.1 kg)   SpO2 99%   Breastfeeding Yes   BMI 29.85 kg/m   Physical Exam Vitals reviewed.  Constitutional:      General: She is not in acute distress.    Appearance: She is not diaphoretic.  HENT:     Head: Normocephalic and atraumatic.     Nose: Nose normal.     Mouth/Throat:     Mouth: Mucous membranes are moist.  Eyes:     General: No scleral icterus.    Extraocular Movements: Extraocular movements intact.  Cardiovascular:     Rate and Rhythm: Normal rate and regular rhythm.     Pulses: Normal pulses.     Heart sounds: No murmur heard. Pulmonary:     Breath sounds: Normal breath sounds. No wheezing or rales.  Abdominal:     Palpations: Abdomen is soft.     Tenderness: There is no abdominal tenderness. There is no right CVA tenderness or left CVA tenderness.     Hernia: A hernia (Umbilical) is  present.  Musculoskeletal:     Cervical back: Neck supple. No tenderness.     Right lower leg: No edema.     Left lower leg: No edema.  Skin:    General: Skin is warm.     Findings: No rash.  Neurological:     General: No focal deficit present.     Mental Status: She is alert and oriented to person, place, and time.     Sensory: No sensory deficit.     Motor: No weakness.  Psychiatric:  Mood and Affect: Mood normal.        Behavior: Behavior normal.    Assessment & Plan:   Problem List Items Addressed This Visit       Other   Encounter to establish care - Primary    Care established History and medications reviewed with the patient      Relevant Orders   Basic Metabolic Panel (BMET) (Completed)   Iron deficiency anemia    Had PRBC and Venofer after delivery Check CBC today Advised to start taking iron supplements      Relevant Orders   CBC with Differential/Platelet (Completed)   Pelvic pain    States that she has pelvic pain since starting OCPs Has h/o ovarian cyst in the past Check US pelvis F/u with Ob/Gyn      Relevant Orders   US Pelvis Complete   Umbilical hernia without obstruction and without gangrene    Usually benign Advised to avoid heavy lifting, but she has 3 children and is unable to avoid straining Going to see General Surgeon      Other Visit Diagnoses     COVID-19 vaccination refused       Influenza vaccine refused           Outpatient Encounter Medications as of 10/28/2020  Medication Sig   norethindrone (MICRONOR) 0.35 MG tablet Take 1 tablet (0.35 mg total) by mouth daily.   No facility-administered encounter medications on file as of 10/28/2020.    Follow-up: Return in about 4 months (around 02/27/2021) for Annual physical.   Anabel Halon, MD

## 2020-10-29 LAB — BASIC METABOLIC PANEL
BUN/Creatinine Ratio: 13 (ref 9–23)
BUN: 12 mg/dL (ref 6–20)
CO2: 22 mmol/L (ref 20–29)
Calcium: 9.2 mg/dL (ref 8.7–10.2)
Chloride: 104 mmol/L (ref 96–106)
Creatinine, Ser: 0.89 mg/dL (ref 0.57–1.00)
Glucose: 67 mg/dL (ref 65–99)
Potassium: 4.1 mmol/L (ref 3.5–5.2)
Sodium: 141 mmol/L (ref 134–144)
eGFR: 93 mL/min/{1.73_m2} (ref >59)

## 2020-10-29 LAB — CBC WITH DIFFERENTIAL/PLATELET
Basophils Absolute: 0 10*3/uL (ref 0.0–0.2)
Basos: 1 %
EOS (ABSOLUTE): 0.1 10*3/uL (ref 0.0–0.4)
Eos: 1 %
Hematocrit: 34.6 % (ref 34.0–46.6)
Hemoglobin: 11.3 g/dL (ref 11.1–15.9)
Immature Grans (Abs): 0 10*3/uL (ref 0.0–0.1)
Immature Granulocytes: 0 %
Lymphocytes Absolute: 2.1 10*3/uL (ref 0.7–3.1)
Lymphs: 37 %
MCH: 27.2 pg (ref 26.6–33.0)
MCHC: 32.7 g/dL (ref 31.5–35.7)
MCV: 83 fL (ref 79–97)
Monocytes Absolute: 0.5 10*3/uL (ref 0.1–0.9)
Monocytes: 9 %
Neutrophils Absolute: 2.9 10*3/uL (ref 1.4–7.0)
Neutrophils: 52 %
Platelets: 352 10*3/uL (ref 150–450)
RBC: 4.16 x10E6/uL (ref 3.77–5.28)
RDW: 16.6 % — ABNORMAL HIGH (ref 11.7–15.4)
WBC: 5.7 10*3/uL (ref 3.4–10.8)

## 2020-11-12 ENCOUNTER — Ambulatory Visit: Payer: Medicaid Other | Admitting: General Surgery

## 2020-11-26 ENCOUNTER — Encounter: Payer: Self-pay | Admitting: General Surgery

## 2020-11-26 ENCOUNTER — Ambulatory Visit: Payer: Medicaid Other | Admitting: General Surgery

## 2020-11-26 ENCOUNTER — Other Ambulatory Visit: Payer: Self-pay

## 2020-11-26 VITALS — BP 116/79 | HR 83 | Temp 98.3°F | Resp 16 | Ht 71.0 in | Wt 218.0 lb

## 2020-11-26 DIAGNOSIS — K429 Umbilical hernia without obstruction or gangrene: Secondary | ICD-10-CM

## 2020-11-26 DIAGNOSIS — M6208 Separation of muscle (nontraumatic), other site: Secondary | ICD-10-CM

## 2020-11-26 NOTE — Progress Notes (Signed)
[facility] History and Physical  Reason for Referral:Umbilical hernia  Referring Physician: Anabel Halon, MD    Chief Complaint   New Patient (Initial Visit)     Christina Conley is a 25 y.o. female.  HPI: Christina Conley is a very sweet 25 with new twins that were born in June. She reports that during her pregnancy she noticed an umbilical hernia and that it has been causing her some minor discomfort off and on since her pregnancy. She notices it when she bends over or laughs a lot. She says that she thinks the discomfort has been getting better and when things have bulged out she is able to push it back in. Her boys are now over 10 lbs. She expects that she will have more children.     Past Medical History:  Diagnosis Date   Acute blood loss anemia 08/12/2020   EBL with delivery PO iron, IV venofer administered postpartum 1 U pRBCs Patient asymptomatic   Asthma    child.  No meds > 2 years   BV (bacterial vaginosis) 07/02/2012   Contraceptive management 05/27/2015   Environmental allergies    Gonorrhea 08/2015   Irregular intermenstrual bleeding 05/27/2015   LLQ pain 05/27/2015   Round ligament pain 12/09/2013   Vaginal discharge 12/09/2013    Past Surgical History:  Procedure Laterality Date   NO PAST SURGERIES      Family History  Problem Relation Age of Onset   Hypertension Maternal Grandfather    Hyperlipidemia Maternal Grandfather    Crohn's disease Mother    Other Father        problems with intestines   Crohn's disease Paternal Grandmother    Hypertension Maternal Grandmother    Miscarriages / Stillbirths Maternal Grandmother     Social History   Tobacco Use   Smoking status: Former    Packs/day: 0.25    Years: 0.50    Pack years: 0.13    Types: Cigarettes   Smokeless tobacco: Never  Vaping Use   Vaping Use: Never used  Substance Use Topics   Alcohol use: No   Drug use: No    Types: Marijuana    Comment: quit 2015    Medications:  I have reviewed the patient's current medications. Allergies as of 11/26/2020       Reactions   Amoxicillin Hives   Ampicillin Hives   Penicillins Hives   Has patient had a PCN reaction causing immediate rash, facial/tongue/throat swelling, SOB or lightheadedness with hypotension: Yes Has patient had a PCN reaction causing severe rash involving mucus membranes or skin necrosis: No Has patient had a PCN reaction that required hospitalization No Has patient had a PCN reaction occurring within the last 10 years: unknown If all of the above answers are "NO", then may proceed with Cephalosporin use.        Medication List        Accurate as of November 26, 2020  2:05 PM. If you have any questions, ask your nurse or doctor.          norethindrone 0.35 MG tablet Commonly known as: MICRONOR Take 1 tablet (0.35 mg total) by mouth daily.         ROS:  A comprehensive review of systems was negative except for: Gastrointestinal: positive for abdominal pain  Blood pressure 116/79, pulse 83, temperature 98.3 F (36.8 C), temperature source Other (Comment), resp. rate 16, height 5\' 11"  (1.803 m), weight 218 lb (98.9 kg),  SpO2 98 %, currently breastfeeding. Physical Exam Vitals reviewed.  Constitutional:      Appearance: Normal appearance.  HENT:     Head: Normocephalic.     Nose: Nose normal.  Eyes:     Extraocular Movements: Extraocular movements intact.  Cardiovascular:     Rate and Rhythm: Normal rate and regular rhythm.  Pulmonary:     Effort: Pulmonary effort is normal.     Breath sounds: Normal breath sounds.  Abdominal:     General: There is no distension.     Palpations: Abdomen is soft.     Tenderness: There is no abdominal tenderness.     Hernia: A hernia is present. Hernia is present in the umbilical area.     Comments: Small <0.5cm defect in the umbilicus, diastasis just supraumbilical 2 fingerbreaths, epigastric with good apposition at 1 fingerbreath separation    Musculoskeletal:        General: Normal range of motion.  Skin:    General: Skin is warm.  Neurological:     General: No focal deficit present.     Mental Status: She is alert.  Psychiatric:        Mood and Affect: Mood normal.        Thought Content: Thought content normal.        Judgment: Judgment normal.    Results: None    Assessment & Plan:  Christina Conley is a 25 y.o. female with a very small < 0.5cm defect and some minor diastasis just superior to the defect. Discussed diastasis and pain associated with that and the small defect. Discussed that likely omentum comes through but risk of incarceration and strangulation and that worry it could be bowel in the future. Discussed that small opening and that a mesh would likely require an even larger opening so primary closure probably the best option this go around. Discussed that she can monitor and wait until after having children if she wants a repair with mesh. Discussed that some of the pain and soreness could get better with exercise for the diastasis and with time post partum.   She is going to think about everything and let us know.   All questions were answered to the satisfaction of the patient.   Lucretia Roers 11/26/2020, 2:05 PM

## 2020-11-26 NOTE — Patient Instructions (Signed)
Postpartum exercises online/ Youtube for Diastasis and pelvic floor. One example: [email or web address]://www.glowbodypt.com  Diastasis Recti Diastasis recti is a condition in which the muscles of the abdomen (rectus abdominis muscles) become thin and separate. The result is a wider space between the muscles of the right and left abdomen (abdominal muscles). This wider space between the muscles may cause a bulge in the middle of the abdomen. This bulge may be noticed when a person is straining or when he or she sits up after lying down. Diastasis recti can affect men and women. It is most common among pregnant women, babies, people with obesity, and people who have had abdominal surgery. Exercise or surgery may help correct this condition. What are the causes? Common causes of this condition include: Pregnancy. As the uterus grows in size, it puts pressure on the abdominal muscles, causing the muscles to separate. Obesity. Excess fat puts pressure on abdominal muscles. Weight lifting. Some exercises of the abdomen. Advanced age. Genetics. Having had surgery on the abdomen before. What increases the risk? This condition is more likely to develop in: Women. Newborns, especially newborns who are born early (prematurely). What are the signs or symptoms? Common symptoms of this condition include: A bulge in the middle of your abdomen. You will notice it most when you sit up or strain. Pain in your low back, hips, or the area between your hip bones (pelvis). Constipation. Being unable to control when you urinate (urinary incontinence). Bloating. Poor posture. How is this diagnosed? This condition is diagnosed with a physical exam. During the exam, your health care provider will ask you to lie flat on your back and do a crunch or half sit-up. If you have diastasis recti, a bulge will appear lengthwise between your abdominal muscles in the center of your abdomen. Your health care provider will measure the gap  between your muscles with one of the following: A medical device used to measure the space between two objects (caliper). A tape measure. CT scan. Ultrasound. Finger spaces. Your health care provider will measure the space using his or her fingers. How is this treated? If your muscle separation is not too large, you may not need treatment. However, if you are a woman who plans to become pregnant again, you should treat this condition before your next pregnancy. Treatment may include: Physical therapy exercises to strengthen and tighten your abdominal muscles. Lifestyle changes such as weight loss and exercise. Over-the-counter pain medicines as needed. Surgery to correct the separation. Follow these instructions at home: Activity Return to your normal activities as told by your health care provider. Ask your health care provider what activities are safe for you. Do exercises as told by your health care provider. Make sure you are doing your exercises and movements correctly when lifting weights or doing exercises using your abdominal muscles or the muscles in the center of your body that give stability (core muscles). Proper form can help to prevent this condition from happening again. General instructions If you are overweight, ask your health care provider for help with weight loss. Losing even a small amount of weight can help to improve your diastasis recti. Take over-the-counter or prescription medicines only as told by your health care provider. Do not strain. Straining can make the separation worse. Examples of straining include: Pushing hard to have a bowel movement, such as when you have constipation. Lifting heavy objects or lifting children. Standing up and sitting down. You may need to take these actions to prevent or  treat constipation: Drink enough fluid to keep your urine pale yellow. Take over-the-counter or prescription medicines. Eat foods that are high in fiber, such as  beans, whole grains, and fresh fruits and vegetables. Limit foods that are high in fat and processed sugars, such as fried or sweet foods. Keep all follow-up visits. This is important. Contact a health care provider if: You notice a new bulge in your abdomen. Get help right away if: You experience severe discomfort in your abdomen. You develop severe abdominal pain along with nausea, vomiting, or a fever. Summary Diastasis recti is a condition in which the muscles of the abdomen (rectus abdominismuscles) become thin and separate. You may notice a bulge in your abdomen because the space has widened between the muscles of the right and left abdomen. The most common symptom is a bulge in the middle of your abdomen. You will notice it most when you sit up or strain. This condition is diagnosed with a physical exam. If the muscle separation is not too big, you may not need treatment. Otherwise, you may need to do physical therapy or have surgery. This information is not intended to replace advice given to you by your health care provider. Make sure you discuss any questions you have with your health care provider. Document Revised: 10/11/2019 Document Reviewed: 10/11/2019 Elsevier Patient Education  2022 [employer]   Umbilical Hernia, Adult A hernia is a bulge of tissue that pushes through an opening between muscles. An umbilical hernia happens in the abdomen, near the belly button (umbilicus). The hernia may contain tissues from the small intestine, large intestine, or fatty tissue covering the intestines (omentum). Umbilical hernias in adults tend to get worse over time, and they require surgical treatment. There are several types of umbilical hernias. You may have: A hernia located just above or below the umbilicus (indirect hernia). This is the most common type of umbilical hernia in adults. A hernia that forms through an opening formed by the umbilicus (direct hernia). A hernia that comes  and goes (reducible hernia). A reducible hernia may be visible only when you strain, lift something heavy, or cough. This type of hernia can be pushed back into the abdomen (reduced). A hernia that traps abdominal tissue inside the hernia (incarcerated hernia). This type of hernia cannot be reduced. A hernia that cuts off blood flow to the tissues inside the hernia (strangulated hernia). The tissues can start to die if this happens. This type of hernia requires emergency treatment. What are the causes? An umbilical hernia happens when tissue inside the abdomen presses on a weak area of the abdominal muscles. What increases the risk? You may have a greater risk of this condition if you: Are obese. Have had several pregnancies. Have a buildup of fluid inside your abdomen (ascites). Have had surgery that weakens the abdominal muscles. What are the signs or symptoms? The main symptom of this condition is a painless bulge at or near the belly button. A reducible hernia may be visible only when you strain, lift something heavy, or cough. Other symptoms may include: Dull pain. A feeling of pressure. Symptoms of a strangulated hernia may include: Pain that gets increasingly worse. Nausea and vomiting. Pain when pressing on the hernia. Skin over the hernia becoming red or purple. Constipation. Blood in the stool. How is this diagnosed? This condition may be diagnosed based on: A physical exam. You may be asked to cough or strain while standing. These actions increase the pressure inside your  abdomen and force the hernia through the opening in your muscles. Your health care provider may try to reduce the hernia by pressing on it. Your symptoms and medical history. How is this treated? Surgery is the only treatment for an umbilical hernia. Surgery for a strangulated hernia is done as soon as possible. If you have a small hernia that is not incarcerated, you may need to lose weight before having  surgery. Follow these instructions at home: Lose weight, if told by your health care provider. Do not try to push the hernia back in. Watch your hernia for any changes in color or size. Tell your health care provider if any changes occur. You may need to avoid activities that increase pressure on your hernia. Do not lift anything that is heavier than 10 lb (4.5 kg) until your health care provider says that this is safe. Take over-the-counter and prescription medicines only as told by your health care provider. Keep all follow-up visits as told by your health care provider. This is important. Contact a health care provider if: Your hernia gets larger. Your hernia becomes painful. Get help right away if: You develop sudden, severe pain near the area of your hernia. You have pain as well as nausea or vomiting. You have pain and the skin over your hernia changes color. You develop a fever. This information is not intended to replace advice given to you by your health care provider. Make sure you discuss any questions you have with your health care provider. Document Revised: 03/22/2017 Document Reviewed: 08/08/2016 Elsevier Patient Education  2021 [employer] Inc.  Open Hernia Repair, Adult Open hernia repair is a surgical procedure to fix a hernia. A hernia occurs when an internal organ or tissue pushes through a weak spot in the muscles along the wall of the abdomen. Hernias commonly occur in the groin and around the belly button. Most hernias tend to get worse over time. Often, surgery is done to prevent the hernia from becoming bigger, uncomfortable, or an emergency. Emergency surgery may be needed if contents of the abdomen get stuck in the opening (incarcerated hernia) or if the blood supply gets cut off (strangulated hernia). In an open repair, an incision is made in the abdomen to perform the surgery. Tell a health care provider about: Any allergies you have. All medicines you are taking,  including vitamins, herbs, eye drops, creams, and over-the-counter medicines. Any problems you or family members have had with anesthetic medicines. Any blood or bone disorders you have. Any surgeries you have had. Any medical conditions you have, including any recent cold or flu (influenza)symptoms. Whether you are pregnant or may be pregnant. What are the risks? Generally, this is a safe procedure. However, problems may occur, including: Long-lasting (chronic) pain. Bleeding. Infection. Damage to the testicles. This can cause shrinking or swelling. Damage to nearby structures or organs, including the bladder, blood vessels, intestines, or nerves near the hernia. Blood clots. Trouble passing urine. Return of the hernia. What happens before the procedure? Medicines Ask your health care provider about: Changing or stopping your regular medicines. This is especially important if you are taking diabetes medicines or blood thinners. Taking medicines such as aspirin and ibuprofen. These medicines can thin your blood. Do not take these medicines unless your health care provider tells you to take them. Taking over-the-counter medicines, vitamins, herbs, and supplements. Surgery safety Ask your health care provider: How your surgery site will be marked. What steps will be taken to help prevent  infection. These steps may include: Removing hair at the surgery site. Washing skin with a germ-killing soap. Receiving antibiotic medicine. General instructions You may have an exam or testing, such as blood tests or imaging studies. Do not use any products that contain nicotine or tobacco for at least 4 weeks before the procedure. These products include cigarettes, chewing tobacco, and vaping devices, such as e-cigarettes. If you need help quitting, ask your health care provider. Let your health care provider know if you develop a cold or any infection before your surgery. If you get an infection  before surgery, you may receive antibiotics to treat it. Plan to have a responsible adult take you home from the hospital or clinic. If you will be going home right after the procedure, plan to have a responsible adult care for you for the time you are told. This is important. What happens during the procedure?  An IV will be inserted into one of your veins. You will be given one or more of the following: A medicine to help you relax (sedative). A medicine to numb the area (local anesthetic). A medicine to make you fall asleep (general anesthetic). Your surgeon will make an incision over the hernia. The tissues of the hernia will be moved back into place. The edges of the hernia may be stitched (sutured) together. The opening in the abdominal muscles will be closed with stitches (sutures). Or, your surgeon will place a mesh patch made of artificial (synthetic) material over the opening. The incision will be closed with sutures, skin glue, or adhesive strips. A bandage (dressing) may be placed over the incision. The procedure may vary among health care providers and hospitals. What happens after the procedure? Your blood pressure, heart rate, breathing rate, and blood oxygen level will be monitored until you leave the hospital or clinic. You may be given medicine for pain. If you were given a sedative during the procedure, it can affect you for several hours. Do not drive or operate machinery until your health care provider says that it is safe. Summary Open hernia repair is a surgical procedure to fix a hernia. Hernias commonly occur in the groin and around the belly button. Emergency surgery may be needed if contents of the abdomen get stuck in the opening (incarcerated hernia) or if the blood supply gets cut off (strangulated hernia). In this procedure, an incision is made in the abdomen to perform the surgery. After the procedure, you may be given medicine for pain. This information is  not intended to replace advice given to you by your health care provider. Make sure you discuss any questions you have with your health care provider. Document Revised: 09/23/2019 Document Reviewed: 09/23/2019 Elsevier Patient Education  2022 [employer].

## 2020-11-27 ENCOUNTER — Ambulatory Visit (HOSPITAL_COMMUNITY): Admission: RE | Admit: 2020-11-27 | Payer: Medicaid Other | Source: Ambulatory Visit

## 2020-12-01 ENCOUNTER — Other Ambulatory Visit: Payer: Self-pay | Admitting: Internal Medicine

## 2020-12-01 ENCOUNTER — Ambulatory Visit (HOSPITAL_COMMUNITY)
Admission: RE | Admit: 2020-12-01 | Discharge: 2020-12-01 | Disposition: A | Discharge status: Home / Self Care | Payer: Medicaid Other | Source: Ambulatory Visit | Attending: Internal Medicine | Admitting: Internal Medicine

## 2020-12-01 ENCOUNTER — Other Ambulatory Visit: Payer: Self-pay

## 2020-12-01 DIAGNOSIS — R102 Pelvic and perineal pain: Secondary | ICD-10-CM | POA: Diagnosis not present

## 2020-12-01 DIAGNOSIS — Z8742 Personal history of other diseases of the female genital tract: Secondary | ICD-10-CM | POA: Diagnosis not present

## 2020-12-15 ENCOUNTER — Ambulatory Visit: Payer: Medicaid Other | Admitting: Adult Health

## 2020-12-17 ENCOUNTER — Other Ambulatory Visit: Payer: Self-pay

## 2020-12-17 ENCOUNTER — Ambulatory Visit (INDEPENDENT_AMBULATORY_CARE_PROVIDER_SITE_OTHER): Payer: Medicaid Other | Admitting: Internal Medicine

## 2020-12-17 ENCOUNTER — Encounter: Payer: Self-pay | Admitting: Internal Medicine

## 2020-12-17 DIAGNOSIS — U071 COVID-19: Secondary | ICD-10-CM

## 2020-12-17 MED ORDER — NIRMATRELVIR/RITONAVIR (PAXLOVID)TABLET: 3.0000 | ORAL_TABLET | Freq: Two times a day (BID) | ORAL | 0 refills | Status: AC

## 2020-12-17 NOTE — Progress Notes (Signed)
Virtual Visit via Telephone Note   This visit type was conducted due to national recommendations for restrictions regarding the COVID-19 Pandemic (e.g. social distancing) in an effort to limit this patient's exposure and mitigate transmission in our community.  Due to her co-morbid illnesses, this patient is at least at moderate risk for complications without adequate follow up.  This format is felt to be most appropriate for this patient at this time.  The patient did not have access to video technology/had technical difficulties with video requiring transitioning to audio format only (telephone).  All issues noted in this document were discussed and addressed.  No physical exam could be performed with this format.  Evaluation Performed:  Follow-up visit  Date:  12/17/2020   ID:  Christina Conley, DOB 09/11/1995, MRN [id number]  Patient Location: Home Provider Location: Office/Clinic  Participants: Patient Location of Patient: Home Location of Provider: Telehealth Consent was obtain for visit to be over via telehealth. I verified that I am speaking with the correct person using two identifiers.  PCP:  Anabel Halon, MD   Chief Complaint: Fever, chills, myalgias  History of Present Illness:    Christina Conley is a 25 y.o. female who has televisit for complaint of fever, chills, myalgias and fatigue for the last 3 days.  She tested positive positive for COVID at home.  She denies any dyspnea or wheezing.  The patient does have symptoms concerning for COVID-19 infection (fever, chills, cough, or new shortness of breath).   Past Medical, Surgical, Social History, Allergies, and Medications have been Reviewed.  Past Medical History:  Diagnosis Date   Acute blood loss anemia 08/12/2020   EBL with delivery PO iron, IV venofer administered postpartum 1 U pRBCs Patient asymptomatic   Asthma    child.  No meds > 2 years   BV (bacterial vaginosis) 07/02/2012   Contraceptive  management 05/27/2015   Environmental allergies    Gonorrhea 08/2015   Irregular intermenstrual bleeding 05/27/2015   LLQ pain 05/27/2015   Round ligament pain 12/09/2013   Vaginal discharge 12/09/2013   Past Surgical History:  Procedure Laterality Date   NO PAST SURGERIES       Current Meds  Medication Sig   nirmatrelvir/ritonavir EUA (PAXLOVID) 20 x 150 MG & 10 x 100MG  TABS Take 3 tablets by mouth 2 (two) times daily for 5 days. (Take nirmatrelvir 150 mg two tablets twice daily for 5 days and ritonavir 100 mg one tablet twice daily for 5 days) Patient GFR is >60.     Allergies:   Amoxicillin, Ampicillin, and Penicillins   ROS:   Please see the history of present illness.     All other systems reviewed and are negative.   Labs/Other Tests and Data Reviewed:    Recent Labs: 02/02/2020: ALT 13 10/28/2020: BUN 12; Creatinine, Ser 0.89; Hemoglobin 11.3; Platelets 352; Potassium 4.1; Sodium 141   Recent Lipid Panel No results found for: CHOL, TRIG, HDL, CHOLHDL, LDLCALC, LDLDIRECT  Wt Readings from Last 3 Encounters:  11/26/20 218 lb (98.9 kg)  10/28/20 214 lb (97.1 kg)  10/21/20 211 lb (95.7 kg)     ASSESSMENT & PLAN:    COVID-19 infection Started Paxlovid Mucinex or Robitussin as needed Tylenol as needed for fever or chills or myalgias Self-quarantine for at least 5 days from symptom onset or afebrile for at least 24 hours, whichever is later  Time:   Today, I have spent 9 minutes reviewing the  chart, including problem list, medications, and with the patient with telehealth technology discussing the above problems.   Medication Adjustments/Labs and Tests Ordered: Current medicines are reviewed at length with the patient today.  Concerns regarding medicines are outlined above.   Tests Ordered: No orders of the defined types were placed in this encounter.   Medication Changes: Meds ordered this encounter  Medications   nirmatrelvir/ritonavir EUA (PAXLOVID) 20 x 150  MG & 10 x 100MG  TABS    Sig: Take 3 tablets by mouth 2 (two) times daily for 5 days. (Take nirmatrelvir 150 mg two tablets twice daily for 5 days and ritonavir 100 mg one tablet twice daily for 5 days) Patient GFR is >60.    Dispense:  30 tablet    Refill:  0     Note: This dictation was prepared with Dragon dictation along with smaller phrase technology. Similar sounding words can be transcribed inadequately or may not be corrected upon review. Any transcriptional errors that result from this process are unintentional.      Disposition:  Follow up  Signed, , MD  12/17/2020 4:56 PM     12/19/2020 [facility]

## 2020-12-24 ENCOUNTER — Encounter: Payer: Self-pay | Admitting: Adult Health

## 2020-12-24 ENCOUNTER — Other Ambulatory Visit: Payer: Self-pay

## 2020-12-24 ENCOUNTER — Ambulatory Visit: Payer: Medicaid Other | Admitting: Adult Health

## 2020-12-24 VITALS — BP 113/76 | HR 80 | Ht 71.0 in | Wt 210.6 lb

## 2020-12-24 DIAGNOSIS — R9389 Abnormal findings on diagnostic imaging of other specified body structures: Secondary | ICD-10-CM | POA: Diagnosis not present

## 2020-12-24 NOTE — Progress Notes (Signed)
  Subjective:     Patient ID: Christina Conley, female   DOB: April 04, 1995, 25 y.o.   MRN: [id number]  HPI Christina Conley is a 25 year old black female, with DP, [id number], referred by Dr Allena Katz to follow up on [place]. She had vaginal delivery of twins 08/11/20 and has had pain with history of ovarian cysts, and US showed thickened endometrium 20 mm, note worthy she started period that day. She has stopped breastfeeding.  PCP is Dr Allena Katz. Lab Results  Component Value Date   DIAGPAP  09/17/2020    - Negative for intraepithelial lesion or malignancy (NILM)    Review of Systems Has had pelvic pain Reviewed past medical,surgical, social and family history. Reviewed medications and allergies.     Objective:   Physical Exam BP 113/76 (BP Location: Right Arm, Patient Position: Sitting, Cuff Size: Normal)   Pulse 80   Ht 5\' 11"  (1.803 m)   Wt 210 lb 9.6 oz (95.5 kg)   LMP 12/01/2020 (Exact Date)   Breastfeeding No   BMI 29.37 kg/m    Skin warm and dry. Lungs: clear to ausculation bilaterally. Cardiovascular: regular rate and rhythm.. 01/31/2021 reviewed with her: Unremarkable uterus and ovaries.  Endometrial complex measures 20 mm thick; endometrial thickness is considered abnormal. Consider follow-up by [place] in 6-8 weeks, during the week immediately following menses (exam timing is critical).  I told her it can be normal when period due.    Fall risk is low  Upstream - 12/24/20 1120       Pregnancy Intention Screening   Does the patient want to become pregnant in the next year? Unsure    Does the patient's partner want to become pregnant in the next year? Unsure    Would the patient like to discuss contraceptive options today? No      Contraception Wrap Up   Current Method Withdrawal or Other Method    End Method Withdrawal or Other Method    Contraception Counseling Provided No             Assessment:     1. Thickened endometrium Will get GYN 13/03/22 01/13/21 at 11 am in office  Plan:     Will talk  when 01/15/21 results in and if she wants OCs will call me

## 2021-01-12 ENCOUNTER — Other Ambulatory Visit: Payer: Self-pay | Admitting: Adult Health

## 2021-01-12 DIAGNOSIS — R9389 Abnormal findings on diagnostic imaging of other specified body structures: Secondary | ICD-10-CM

## 2021-01-13 ENCOUNTER — Other Ambulatory Visit: Payer: Medicaid Other

## 2021-02-03 ENCOUNTER — Encounter: Payer: Self-pay | Admitting: Internal Medicine

## 2021-02-03 ENCOUNTER — Ambulatory Visit (INDEPENDENT_AMBULATORY_CARE_PROVIDER_SITE_OTHER): Payer: Medicaid Other | Admitting: Internal Medicine

## 2021-02-03 ENCOUNTER — Other Ambulatory Visit: Payer: Self-pay

## 2021-02-03 DIAGNOSIS — F329 Major depressive disorder, single episode, unspecified: Secondary | ICD-10-CM | POA: Insufficient documentation

## 2021-02-03 DIAGNOSIS — F53 Postpartum depression: Secondary | ICD-10-CM | POA: Diagnosis not present

## 2021-02-03 NOTE — Assessment & Plan Note (Signed)
Flowsheet Row Office Visit from 02/03/2021 in [facility]  PHQ-9 Total Score 7     Referred to [facility] therapy for now Advised to contact if her symptoms get worse -we will start SSRI in that case No concern for SI or HI currently

## 2021-02-03 NOTE — Progress Notes (Signed)
Virtual Visit via Telephone Note   This visit type was conducted due to national recommendations for restrictions regarding the COVID-19 Pandemic (e.g. social distancing) in an effort to limit this patient's exposure and mitigate transmission in our community.  Due to her co-morbid illnesses, this patient is at least at moderate risk for complications without adequate follow up.  This format is felt to be most appropriate for this patient at this time.  The patient did not have access to video technology/had technical difficulties with video requiring transitioning to audio format only (telephone).  All issues noted in this document were discussed and addressed.  No physical exam could be performed with this format.  Evaluation Performed:  Follow-up visit  Date:  02/03/2021   ID:  Christina Conley, DOB 07-11-1995, MRN [id number]  Patient Location: Home Provider Location: Office/Clinic  Participants: Patient Location of Patient: Home Location of Provider: Telehealth Consent was obtain for visit to be over via telehealth. I verified that I am speaking with the correct person using two identifiers.  PCP:  Anabel Halon, MD   Chief Complaint: Depression  History of Present Illness:    Christina Conley is a 25 y.o. female who has a televisit for complaint of anhedonia and stress, which is chronic, but has been worse since her delivery in 07/2020.  She has lack of interest in activities, but does report that she does not go out of her home lately since she has twins now.  She has a good support from her partner.  Denies any SI or HI currently.  She has had [facility] therapy in the past for depression/anxiety.  The patient does not have symptoms concerning for COVID-19 infection (fever, chills, cough, or new shortness of breath).   Past Medical, Surgical, Social History, Allergies, and Medications have been Reviewed.  Past Medical History:  Diagnosis Date   Acute blood loss anemia 08/12/2020    EBL with delivery PO iron, IV venofer administered postpartum 1 U pRBCs Patient asymptomatic   Asthma    child.  No meds > 2 years   BV (bacterial vaginosis) 07/02/2012   Contraceptive management 05/27/2015   Environmental allergies    Gonorrhea 08/2015   Irregular intermenstrual bleeding 05/27/2015   LLQ pain 05/27/2015   Round ligament pain 12/09/2013   Vaginal discharge 12/09/2013   Past Surgical History:  Procedure Laterality Date   NO PAST SURGERIES       Current Meds  Medication Sig   acetaminophen (TYLENOL) 500 MG tablet Take 500 mg by mouth every 6 (six) hours as needed.   ibuprofen (ADVIL) 200 MG tablet Take 200 mg by mouth every 6 (six) hours as needed.     Allergies:   Amoxicillin, Ampicillin, and Penicillins   ROS:   Please see the history of present illness.     All other systems reviewed and are negative.   Labs/Other Tests and Data Reviewed:    Recent Labs: 10/28/2020: BUN 12; Creatinine, Ser 0.89; Hemoglobin 11.3; Platelets 352; Potassium 4.1; Sodium 141   Recent Lipid Panel No results found for: CHOL, TRIG, HDL, CHOLHDL, LDLCALC, LDLDIRECT  Wt Readings from Last 3 Encounters:  12/24/20 210 lb 9.6 oz (95.5 kg)  11/26/20 218 lb (98.9 kg)  10/28/20 214 lb (97.1 kg)      ASSESSMENT & PLAN:    Postpartum depression Flowsheet Row Office Visit from 02/03/2021 in [facility]  PHQ-9 Total Score 7      Referred  to [facility] therapy for now Advised to contact if her symptoms get worse -we will start SSRI in that case No concern for SI or HI currently   Time:   Today, I have spent 9 minutes reviewing the chart, including problem list, medications, and with the patient with telehealth technology discussing the above problems.   Medication Adjustments/Labs and Tests Ordered: Current medicines are reviewed at length with the patient today.  Concerns regarding medicines are outlined above.   Tests Ordered: No orders of the defined types were  placed in this encounter.   Medication Changes: No orders of the defined types were placed in this encounter.    Note: This dictation was prepared with Dragon dictation along with smaller phrase technology. Similar sounding words can be transcribed inadequately or may not be corrected upon review. Any transcriptional errors that result from this process are unintentional.      Disposition:  Follow up  Signed, Anabel Halon, MD  02/03/2021 1:20 PM     [facility]

## 2021-02-09 ENCOUNTER — Other Ambulatory Visit: Payer: Self-pay | Admitting: *Deleted

## 2021-02-09 ENCOUNTER — Other Ambulatory Visit: Payer: Self-pay

## 2021-02-09 ENCOUNTER — Ambulatory Visit (INDEPENDENT_AMBULATORY_CARE_PROVIDER_SITE_OTHER): Payer: Medicaid Other | Admitting: Licensed Clinical Social Worker

## 2021-02-09 DIAGNOSIS — F53 Postpartum depression: Secondary | ICD-10-CM

## 2021-02-26 NOTE — Progress Notes (Signed)
Declined service.  Desires in person therapy

## 2021-03-03 ENCOUNTER — Encounter: Payer: Self-pay | Admitting: Internal Medicine

## 2021-03-03 ENCOUNTER — Ambulatory Visit (INDEPENDENT_AMBULATORY_CARE_PROVIDER_SITE_OTHER): Payer: Medicaid Other | Admitting: Internal Medicine

## 2021-03-03 ENCOUNTER — Other Ambulatory Visit: Payer: Self-pay

## 2021-03-03 VITALS — BP 108/68 | HR 87 | Resp 18 | Ht 71.0 in | Wt 214.1 lb

## 2021-03-03 DIAGNOSIS — Z23 Encounter for immunization: Secondary | ICD-10-CM | POA: Diagnosis not present

## 2021-03-03 DIAGNOSIS — F53 Postpartum depression: Secondary | ICD-10-CM

## 2021-03-03 DIAGNOSIS — Z0001 Encounter for general adult medical examination with abnormal findings: Secondary | ICD-10-CM | POA: Insufficient documentation

## 2021-03-03 DIAGNOSIS — Z118 Encounter for screening for other infectious and parasitic diseases: Secondary | ICD-10-CM | POA: Diagnosis not present

## 2021-03-03 NOTE — Assessment & Plan Note (Signed)
[place] Office Visit from 03/03/2021 in [facility]  PHQ-9 Total Score 10     Referred to [facility] therapy for now Advised to contact if her symptoms get worse -we will start SSRI in that case No concern for SI or HI currently

## 2021-03-03 NOTE — Assessment & Plan Note (Signed)
Physical exam as documented. Refused flu vaccine.  Tdap given today. Fasting blood tests reviewed from previous visit. 

## 2021-03-03 NOTE — Patient Instructions (Addendum)
Please take Tylenol for knee pain. Okay to use Voltaren gel for knee pain.  Please try to follow low carb diet and perform moderate exercise/walking at least 150 mins/week.  You are being referred to [facility] in [place].

## 2021-03-03 NOTE — Progress Notes (Signed)
Established Patient Office Visit  Subjective:  Patient ID: Christina Conley, female    DOB: 08/07/1995  Age: 26 y.o. MRN: [id number]  CC:  Chief Complaint  Patient presents with   Annual Exam    Annual exam pt has never heard from psych referral that was placed at last visit also pt has been feeling very fatigued lately     HPI Christina Conley is a 26 y.o. female with past medical history of IDA and umbilical hernia who presents for annual physical.  She still complains of anhedonia, fatigue, insomnia and stress/anxiety since her delivery.  She has not been able to get [facility] therapy yet.  She was referred to [facility] therapy, but she did not not prefer virtual therapy sessions.  She is willing to be referred to a different clinic for [facility] therapy.  She still does not want medical treatment for postpartum depression.  She denies any SI or HI currently.  She has good support from her partner for taking care of her children.  She received TDaP vaccine in the office today.  Past Medical History:  Diagnosis Date   Acute blood loss anemia 08/12/2020   EBL with delivery 660m PO iron, IV venofer administered postpartum 1 U pRBCs Patient asymptomatic   Asthma    child.  No meds > 2 years   BV (bacterial vaginosis) 07/02/2012   Contraceptive management 05/27/2015   Environmental allergies    Gonorrhea 08/2015   Irregular intermenstrual bleeding 05/27/2015   LLQ pain 05/27/2015   Round ligament pain 12/09/2013   Vaginal discharge 12/09/2013    Past Surgical History:  Procedure Laterality Date   NO PAST SURGERIES      Family History  Problem Relation Age of Onset   Crohn's disease Paternal Grandmother    Hypertension Maternal Grandmother    Miscarriages / Stillbirths Maternal Grandmother    Hypertension Maternal Grandfather    Hyperlipidemia Maternal Grandfather    Other Father        problems with intestines   Crohn's disease Mother     Social History   Socioeconomic History   Marital  status: Single    Spouse name: Not on file   Number of children: Not on file   Years of education: Not on file   Highest education level: Not on file  Occupational History   Not on file  Tobacco Use   Smoking status: Former    Packs/day: 0.25    Years: 0.50    Pack years: 0.13    Types: Cigarettes   Smokeless tobacco: Never  Vaping Use   Vaping Use: Never used  Substance and Sexual Activity   Alcohol use: No   Drug use: No    Types: Marijuana    Comment: quit 2015   Sexual activity: Yes    Birth control/protection: Coitus interruptus  Other Topics Concern   Not on file  Social History Narrative   Not on file   Social Determinants of Health   Financial Resource Strain: Low Risk    Difficulty of Paying Living Expenses: Not hard at all  Food Insecurity: No Food Insecurity   Worried About RCharity fundraiserin the Last Year: Never true   Ran Out of Food in the Last Year: Never true  Transportation Needs: No Transportation Needs   Lack of Transportation (Medical): No   Lack of Transportation (Non-Medical): No  Physical Activity: Inactive   Days of Exercise per Week: 0 days  Minutes of Exercise per Session: 0 min  Stress: Stress Concern Present   Feeling of Stress : Rather much  Social Connections: Moderately Integrated   Frequency of Communication with Friends and Family: More than three times a week   Frequency of Social Gatherings with Friends and Family: Never   Attends Religious Services: More than 4 times per year   Active Member of [employer] or Organizations: No   Attends Archivist Meetings: Never   Marital Status: Living with partner  Intimate Partner Violence: Not At Risk   Fear of Current or Ex-Partner: No   Emotionally Abused: No   Physically Abused: No   Sexually Abused: No    Outpatient Medications Prior to Visit  Medication Sig Dispense Refill   acetaminophen (TYLENOL) 500 MG tablet Take 500 mg by mouth every 6 (six) hours as needed.      ibuprofen (ADVIL) 200 MG tablet Take 200 mg by mouth every 6 (six) hours as needed.     No facility-administered medications prior to visit.    Allergies  Allergen Reactions   Amoxicillin Hives   Ampicillin Hives   Penicillins Hives    Has patient had a PCN reaction causing immediate rash, facial/tongue/throat swelling, SOB or lightheadedness with hypotension: Yes Has patient had a PCN reaction causing severe rash involving mucus membranes or skin necrosis: No Has patient had a PCN reaction that required hospitalization No Has patient had a PCN reaction occurring within the last 10 years: unknown If all of the above answers are "NO", then may proceed with Cephalosporin use.     ROS Review of Systems  Constitutional:  Negative for chills and fever.  HENT:  Negative for congestion, sinus pressure, sinus pain and sore throat.   Eyes:  Negative for pain and discharge.  Respiratory:  Negative for cough and shortness of breath.   Cardiovascular:  Negative for chest pain and palpitations.  Gastrointestinal:  Negative for abdominal pain, constipation, diarrhea, nausea and vomiting.  Endocrine: Negative for polydipsia and polyuria.  Genitourinary:  Negative for dysuria and hematuria.  Musculoskeletal:  Negative for neck pain and neck stiffness.  Skin:  Negative for rash.  Neurological:  Negative for dizziness and weakness.  Psychiatric/Behavioral:  Positive for dysphoric mood and sleep disturbance. Negative for agitation and behavioral problems.      Objective:    Physical Exam Vitals reviewed.  Constitutional:      General: She is not in acute distress.    Appearance: She is not diaphoretic.  HENT:     Head: Normocephalic and atraumatic.     Nose: Nose normal.     Mouth/Throat:     Mouth: Mucous membranes are moist.  Eyes:     General: No scleral icterus.    Extraocular Movements: Extraocular movements intact.  Cardiovascular:     Rate and Rhythm: Normal rate and regular  rhythm.     Pulses: Normal pulses.     Heart sounds: No murmur heard. Pulmonary:     Breath sounds: Normal breath sounds. No wheezing or rales.  Abdominal:     Palpations: Abdomen is soft.     Tenderness: There is no abdominal tenderness. There is no right CVA tenderness or left CVA tenderness.     Hernia: A hernia (Umbilical) is present.  Musculoskeletal:     Cervical back: Neck supple. No tenderness.     Right lower leg: No edema.     Left lower leg: No edema.  Skin:    General:  Skin is warm.     Findings: No rash.  Neurological:     General: No focal deficit present.     Mental Status: She is alert and oriented to person, place, and time.     Cranial Nerves: No cranial nerve deficit.     Sensory: No sensory deficit.     Motor: No weakness.  Psychiatric:        Mood and Affect: Mood normal.        Behavior: Behavior normal.    BP 108/68 (BP Location: Right Arm, Patient Position: Sitting, Cuff Size: Normal)    Pulse 87    Resp 18    Ht 5' 11"  (1.803 m)    Wt 214 lb 1.9 oz (97.1 kg)    SpO2 96%    BMI 29.86 kg/m  Wt Readings from Last 3 Encounters:  03/03/21 214 lb 1.9 oz (97.1 kg)  12/24/20 210 lb 9.6 oz (95.5 kg)  11/26/20 218 lb (98.9 kg)    Lab Results  Component Value Date   TSH 1.602 09/25/2013   Lab Results  Component Value Date   WBC 5.7 10/28/2020   HGB 11.3 10/28/2020   HCT 34.6 10/28/2020   MCV 83 10/28/2020   PLT 352 10/28/2020   Lab Results  Component Value Date   NA 141 10/28/2020   K 4.1 10/28/2020   CO2 22 10/28/2020   GLUCOSE 67 10/28/2020   BUN 12 10/28/2020   CREATININE 0.89 10/28/2020   BILITOT 0.2 (L) 02/02/2020   ALKPHOS 53 02/02/2020   AST 15 02/02/2020   ALT 13 02/02/2020   PROT 7.5 02/02/2020   ALBUMIN 3.6 02/02/2020   CALCIUM 9.2 10/28/2020   ANIONGAP 7 02/02/2020   EGFR 93 10/28/2020   No results found for: CHOL No results found for: HDL No results found for: LDLCALC No results found for: TRIG No results found for:  CHOLHDL No results found for: HGBA1C    Assessment & Plan:   Problem List Items Addressed This Visit       Other   Postpartum depression    Flowsheet Row Office Visit from 03/03/2021 in [facility]  PHQ-9 Total Score 10  Referred to [facility] therapy for now Advised to contact if her symptoms get worse -we will start SSRI in that case No concern for SI or HI currently      Relevant Orders   Ambulatory referral to Psychiatry   Encounter for general adult medical examination with abnormal findings - Primary    Physical exam as documented. Refused flu vaccine.  Tdap given today. Fasting blood tests reviewed from previous visit.      Other Visit Diagnoses     Screening for chlamydial disease       Relevant Orders   Chlamydia/GC NAA, Confirmation   Need for Tdap vaccination       Relevant Orders   Tdap vaccine greater than or equal to 7yo IM (Completed)       No orders of the defined types were placed in this encounter.   Follow-up: Return in about 1 year (around 03/03/2022) for Annual physical.    Lindell Spar, MD

## 2021-03-09 DIAGNOSIS — F3132 Bipolar disorder, current episode depressed, moderate: Secondary | ICD-10-CM | POA: Diagnosis not present

## 2021-03-09 DIAGNOSIS — Z5181 Encounter for therapeutic drug level monitoring: Secondary | ICD-10-CM | POA: Diagnosis not present

## 2021-03-09 DIAGNOSIS — F411 Generalized anxiety disorder: Secondary | ICD-10-CM | POA: Diagnosis not present

## 2021-03-23 DIAGNOSIS — F411 Generalized anxiety disorder: Secondary | ICD-10-CM | POA: Diagnosis not present

## 2021-03-23 DIAGNOSIS — F3132 Bipolar disorder, current episode depressed, moderate: Secondary | ICD-10-CM | POA: Diagnosis not present

## 2021-04-04 DIAGNOSIS — F332 Major depressive disorder, recurrent severe without psychotic features: Secondary | ICD-10-CM | POA: Diagnosis not present

## 2021-04-09 DIAGNOSIS — F332 Major depressive disorder, recurrent severe without psychotic features: Secondary | ICD-10-CM | POA: Diagnosis not present

## 2021-04-27 DIAGNOSIS — F332 Major depressive disorder, recurrent severe without psychotic features: Secondary | ICD-10-CM | POA: Diagnosis not present

## 2021-05-14 DIAGNOSIS — F332 Major depressive disorder, recurrent severe without psychotic features: Secondary | ICD-10-CM | POA: Diagnosis not present

## 2021-05-26 ENCOUNTER — Telehealth: Payer: Self-pay | Admitting: Internal Medicine

## 2021-05-26 NOTE — Telephone Encounter (Signed)
Spoke with pt scheduled an appt

## 2021-05-26 NOTE — Telephone Encounter (Signed)
Return call

## 2021-05-27 ENCOUNTER — Ambulatory Visit: Payer: Medicaid Other | Admitting: Internal Medicine

## 2021-05-27 ENCOUNTER — Ambulatory Visit (INDEPENDENT_AMBULATORY_CARE_PROVIDER_SITE_OTHER): Payer: Medicaid Other | Admitting: Advanced Practice Midwife

## 2021-05-27 ENCOUNTER — Other Ambulatory Visit: Payer: Medicaid Other

## 2021-05-27 ENCOUNTER — Other Ambulatory Visit (HOSPITAL_COMMUNITY)
Admission: RE | Admit: 2021-05-27 | Discharge: 2021-05-27 | Disposition: A | Discharge status: Home / Self Care | Payer: Medicaid Other | Source: Ambulatory Visit | Attending: Obstetrics & Gynecology | Admitting: Obstetrics & Gynecology

## 2021-05-27 ENCOUNTER — Encounter: Payer: Self-pay | Admitting: Advanced Practice Midwife

## 2021-05-27 VITALS — BP 115/70 | HR 77 | Wt 222.0 lb

## 2021-05-27 DIAGNOSIS — Z113 Encounter for screening for infections with a predominantly sexual mode of transmission: Secondary | ICD-10-CM | POA: Insufficient documentation

## 2021-05-27 DIAGNOSIS — R300 Dysuria: Secondary | ICD-10-CM | POA: Diagnosis not present

## 2021-05-27 DIAGNOSIS — R109 Unspecified abdominal pain: Secondary | ICD-10-CM | POA: Insufficient documentation

## 2021-05-27 LAB — POCT URINALYSIS DIPSTICK
Blood, UA: NEGATIVE
Glucose, UA: NEGATIVE
Ketones, UA: NEGATIVE
Leukocytes, UA: NEGATIVE
Nitrite, UA: NEGATIVE
Protein, UA: NEGATIVE

## 2021-05-27 NOTE — Progress Notes (Signed)
WORK IN FOR:  "? UTI or yeast infection".  Tuesday was having some crampy discomfort in lower pelvis, particularly when her bladder got full. Not really having it any more.   ? ? [place] [facility] Visit  ?Patient name: Christina Conley MRN [id number]  Date of birth: [birth year] ? ?CC & HPI:  ?See above ? ?Pertinent History Reviewed:  ?Medical & Surgical Hx:   ?Past Medical History:  ?Diagnosis Date  ? Acute blood loss anemia 08/12/2020  ? EBL with delivery 692mL PO iron, IV venofer administered postpartum 1 U pRBCs Patient asymptomatic  ? Asthma   ? child.  No meds > 2 years  ? BV (bacterial vaginosis) 07/02/2012  ? Contraceptive management 05/27/2015  ? Environmental allergies   ? Gonorrhea 08/2015  ? Irregular intermenstrual bleeding 05/27/2015  ? LLQ pain 05/27/2015  ? Round ligament pain 12/09/2013  ? Vaginal discharge 12/09/2013  ? ?Past Surgical History:  ?Procedure Laterality Date  ? NO PAST SURGERIES    ? ?Family History  ?Problem Relation Age of Onset  ? Crohn's disease Paternal Grandmother   ? Hypertension Maternal Grandmother   ? Miscarriages / Stillbirths Maternal Grandmother   ? Hypertension Maternal Grandfather   ? Hyperlipidemia Maternal Grandfather   ? Other Father   ?     problems with intestines  ? Crohn's disease Mother   ? ? ?Current Outpatient Medications:  ?  acetaminophen (TYLENOL) 500 MG tablet, Take 500 mg by mouth every 6 (six) hours as needed., Disp: , Rfl:  ?  ibuprofen (ADVIL) 200 MG tablet, Take 200 mg by mouth every 6 (six) hours as needed. (Patient not taking: Reported on 05/27/2021), Disp: , Rfl:  ?Social History: Reviewed -  reports that she has quit smoking. Her smoking use included cigarettes. She has a 0.13 pack-year smoking history. She has never used smokeless tobacco. ? ?Review of Systems:   ?Constitutional: Negative for fever and chills ?Eyes: Negative for visual disturbances ?Respiratory: Negative for shortness of breath, dyspnea ?Cardiovascular: Negative for chest pain or  palpitations  ?Gastrointestinal: Negative for vomiting, diarrhea and constipation; no abdominal pain ?Genitourinary: Negative for dysuria and urgency, vaginal irritation or itching ?Musculoskeletal: Negative for back pain, joint pain, myalgias  ?Neurological: Negative for dizziness and headaches ? ? ? ?Objective Findings:  ? ? ?Physical Examination: ?Vitals:  ? 05/27/21 1441  ?BP: 115/70  ?Pulse: 77  ? ?General appearance - well appearing, and in no distress ?Mental status - alert, oriented to person, place, and time ?Chest:  Normal respiratory effort ?Heart - normal rate and regular rhythm ?Abdomen:  Soft, nontender ?Pelvic: SSE:  normal appearing DC  Swab collected ?Musculoskeletal:  Normal range of motion without pain ?Extremities:  No edema ? ? ? ?Results for orders placed or performed in visit on 05/27/21 (from the past 24 hour(s))  ?POCT Urinalysis Dipstick  ? Collection Time: 05/27/21  2:44 PM  ?Result Value Ref Range  ? Color, UA    ? Clarity, UA    ? Glucose, UA Negative Negative  ? Bilirubin, UA    ? Ketones, UA neg   ? Spec Grav, UA    ? Blood, UA neg   ? pH, UA    ? Protein, UA Negative Negative  ? Urobilinogen, UA    ? Nitrite, UA neg   ? Leukocytes, UA Negative Negative  ? Appearance    ? Odor    ?  ? ?Assessment & Plan:  ?A:  ? ? Bladder spasms,  resolved ? STI screening  ? No follow-ups on file. ? ?Christin Fudge CNM ?05/27/2021 ?2:53 PM ? ? ? ? ? ?

## 2021-05-27 NOTE — Addendum Note (Signed)
Addended by: Moss Mc on: 05/27/2021 03:18 PM ? ? Modules accepted: Orders ? ?

## 2021-05-28 LAB — CERVICOVAGINAL ANCILLARY ONLY
Bacterial Vaginitis (gardnerella): NEGATIVE
Candida Glabrata: NEGATIVE
Candida Vaginitis: NEGATIVE
Chlamydia: NEGATIVE
Comment: NEGATIVE
Comment: NEGATIVE
Comment: NEGATIVE
Comment: NEGATIVE
Comment: NEGATIVE
Comment: NORMAL
Neisseria Gonorrhea: NEGATIVE
Trichomonas: NEGATIVE

## 2021-05-31 ENCOUNTER — Ambulatory Visit: Payer: Medicaid Other | Admitting: Family Medicine

## 2021-06-02 DIAGNOSIS — F332 Major depressive disorder, recurrent severe without psychotic features: Secondary | ICD-10-CM | POA: Diagnosis not present

## 2021-06-08 DIAGNOSIS — F332 Major depressive disorder, recurrent severe without psychotic features: Secondary | ICD-10-CM | POA: Diagnosis not present

## 2021-06-14 DIAGNOSIS — F411 Generalized anxiety disorder: Secondary | ICD-10-CM | POA: Diagnosis not present

## 2021-06-14 DIAGNOSIS — F3132 Bipolar disorder, current episode depressed, moderate: Secondary | ICD-10-CM | POA: Diagnosis not present

## 2021-06-15 DIAGNOSIS — F332 Major depressive disorder, recurrent severe without psychotic features: Secondary | ICD-10-CM | POA: Diagnosis not present

## 2021-06-23 ENCOUNTER — Ambulatory Visit (INDEPENDENT_AMBULATORY_CARE_PROVIDER_SITE_OTHER): Payer: Medicaid Other | Admitting: Family Medicine

## 2021-06-23 ENCOUNTER — Encounter: Payer: Self-pay | Admitting: Family Medicine

## 2021-06-23 DIAGNOSIS — J020 Streptococcal pharyngitis: Secondary | ICD-10-CM | POA: Diagnosis not present

## 2021-06-23 MED ORDER — AZITHROMYCIN 250 MG PO TABS: ORAL_TABLET | ORAL | 0 refills | Status: DC

## 2021-06-23 NOTE — Progress Notes (Signed)
?  ? ?Virtual Visit via Telephone Note  ? ?This visit type was conducted due to national recommendations for restrictions regarding the COVID-19 Pandemic (e.g. social distancing) in an effort to limit this patient's exposure and mitigate transmission in our community.  Due to her co-morbid illnesses, this patient is at least at moderate risk for complications without adequate follow up.  This format is felt to be most appropriate for this patient at this time.  The patient did not have access to video technology/had technical difficulties with video requiring transitioning to audio format only (telephone).  All issues noted in this document were discussed and addressed.  No physical exam could be performed with this format.  Please refer to the patient's chart for her  consent to telehealth for [facility].  ? ?Evaluation Performed:  Acute Visit ? ?Date:  06/23/2021  ? ?ID:  Christina Conley, DOB 06-02-1995, MRN [id number] ? ?Patient Location: Home ?Provider Location: Office/Clinic ? ?Participants: Patient ?Location of Patient: Home ?Location of Provider: Telehealth ?Consent was obtain for visit to be over via telehealth. ?I verified that I am speaking with the correct person using two identifiers. ? ?PCP:  Anabel Halon, MD  ? ?Chief Complaint:  Sore throat ? ?History of Present Illness:   ? ?Christina Conley is a 26 y.o. female with PMH of iron deficiency anemia, pelvic pain, and postpartum depression. She complained of a sore throat with enlarged, reddened tonsils. The patient complained of difficulty swallowing and talking. She reports that her symptoms started on 06/22/21 and have worsened over the past two hours. She denies fever, runny nose, cough, hoarseness, and diarrhea. She reports taking tylenol for pain relief. ? ? ? ?The patient does not have symptoms concerning for COVID-19 infection (fever, chills, cough, or new shortness of breath).  ? ?Past Medical, Surgical, Social History, Allergies, and Medications  have been Reviewed. ? ?Past Medical History:  ?Diagnosis Date  ? Acute blood loss anemia 08/12/2020  ? EBL with delivery PO iron, IV venofer administered postpartum 1 U pRBCs Patient asymptomatic  ? Asthma   ? child.  No meds > 2 years  ? BV (bacterial vaginosis) 07/02/2012  ? Contraceptive management 05/27/2015  ? Environmental allergies   ? Gonorrhea 08/2015  ? Irregular intermenstrual bleeding 05/27/2015  ? LLQ pain 05/27/2015  ? Round ligament pain 12/09/2013  ? Vaginal discharge 12/09/2013  ? ?Past Surgical History:  ?Procedure Laterality Date  ? NO PAST SURGERIES    ?  ? ?Current Meds  ?Medication Sig  ? acetaminophen (TYLENOL) 500 MG tablet Take 500 mg by mouth every 6 (six) hours as needed.  ? azithromycin (ZITHROMAX) 250 MG tablet Take 2 tablets on day 1, then 1 tablet daily on days 2 through 5  ? ibuprofen (ADVIL) 200 MG tablet Take 200 mg by mouth every 6 (six) hours as needed.  ?  ? ?Allergies:   Amoxicillin, Ampicillin, and Penicillins  ? ?ROS:   ?Please see the history of present illness.    ? ?All other systems reviewed and are negative. ? ? ?Labs/Other Tests and Data Reviewed:   ? ?Recent Labs: ?10/28/2020: BUN 12; Creatinine, Ser 0.89; Hemoglobin 11.3; Platelets 352; Potassium 4.1; Sodium 141  ? ?Recent Lipid Panel ?No results found for: CHOL, TRIG, HDL, CHOLHDL, LDLCALC, LDLDIRECT ? ?Wt Readings from Last 3 Encounters:  ?05/27/21 222 lb (100.7 kg)  ?03/03/21 214 lb 1.9 oz (97.1 kg)  ?12/24/20 210 lb 9.6 oz (95.5 kg)  ?  ? ?  Objective:   ? ?Vital Signs:  There were no vitals taken for this visit.  ? ? ? ?ASSESSMENT & PLAN:   ?Acute Pharyngitis ?-Antibiotic ordered and instruction to complete the full course of therapy ?-Advise gargling with warm salt water,  increasing fluid intake, and  changing toothbrush after treatment ? ?Time:   ?Today, I have spent 15 minutes reviewing the chart, including problem list, medications, and with the patient with telehealth technology discussing the above  problems. ? ? ?Medication Adjustments/Labs and Tests Ordered: ?Current medicines are reviewed at length with the patient today.  Concerns regarding medicines are outlined above.  ? ?Tests Ordered: ?No orders of the defined types were placed in this encounter. ? ? ?Medication Changes: ?Meds ordered this encounter  ?Medications  ? azithromycin (ZITHROMAX) 250 MG tablet  ?  Sig: Take 2 tablets on day 1, then 1 tablet daily on days 2 through 5  ?  Dispense:  6 tablet  ?  Refill:  0  ? ? ? ? ? ?Disposition:  Follow up if symptoms worsen ?Signed, ?Gilmore Laroche, FNP  ?06/23/2021 4:11 PM    ? ?[facility] ?[facility] ?

## 2021-06-24 ENCOUNTER — Ambulatory Visit: Payer: Medicaid Other | Admitting: Family Medicine

## 2021-06-25 ENCOUNTER — Telehealth: Payer: Self-pay

## 2021-06-26 ENCOUNTER — Other Ambulatory Visit: Payer: Self-pay | Admitting: Family Medicine

## 2021-06-26 DIAGNOSIS — J020 Streptococcal pharyngitis: Secondary | ICD-10-CM

## 2021-06-26 MED ORDER — CLINDAMYCIN HCL 300 MG PO CAPS: 300.0000 mg | ORAL_CAPSULE | Freq: Three times a day (TID) | ORAL | 0 refills | Status: DC

## 2021-06-28 NOTE — Telephone Encounter (Signed)
Left vm for patient

## 2021-06-29 ENCOUNTER — Other Ambulatory Visit: Payer: Self-pay

## 2021-06-29 DIAGNOSIS — J020 Streptococcal pharyngitis: Secondary | ICD-10-CM

## 2021-06-29 MED ORDER — CLINDAMYCIN HCL 300 MG PO CAPS: 300.0000 mg | ORAL_CAPSULE | Freq: Three times a day (TID) | ORAL | 0 refills | Status: AC

## 2021-06-29 NOTE — Telephone Encounter (Signed)
Returned pt call, states clindamycin was sent to wrong pharmacy, rx resent to [facility] in [place] ?

## 2021-06-29 NOTE — Telephone Encounter (Signed)
Patient returning call. Call back # [phone] ?

## 2021-07-09 DIAGNOSIS — F332 Major depressive disorder, recurrent severe without psychotic features: Secondary | ICD-10-CM | POA: Diagnosis not present

## 2021-08-13 DIAGNOSIS — F332 Major depressive disorder, recurrent severe without psychotic features: Secondary | ICD-10-CM | POA: Diagnosis not present

## 2021-09-10 DIAGNOSIS — F332 Major depressive disorder, recurrent severe without psychotic features: Secondary | ICD-10-CM | POA: Diagnosis not present

## 2021-10-01 DIAGNOSIS — F332 Major depressive disorder, recurrent severe without psychotic features: Secondary | ICD-10-CM | POA: Diagnosis not present

## 2021-12-13 DIAGNOSIS — F320 Major depressive disorder, single episode, mild: Secondary | ICD-10-CM | POA: Diagnosis not present

## 2022-01-11 ENCOUNTER — Ambulatory Visit: Payer: Medicaid Other | Admitting: Adult Health

## 2022-01-11 ENCOUNTER — Encounter: Payer: Self-pay | Admitting: Adult Health

## 2022-01-11 ENCOUNTER — Other Ambulatory Visit (HOSPITAL_COMMUNITY)
Admission: RE | Admit: 2022-01-11 | Discharge: 2022-01-11 | Disposition: A | Discharge status: Home / Self Care | Payer: Medicaid Other | Source: Ambulatory Visit | Attending: Adult Health | Admitting: Adult Health

## 2022-01-11 VITALS — BP 109/64 | HR 73 | Ht 71.0 in | Wt 209.0 lb

## 2022-01-11 DIAGNOSIS — Z3202 Encounter for pregnancy test, result negative: Secondary | ICD-10-CM | POA: Diagnosis not present

## 2022-01-11 DIAGNOSIS — N898 Other specified noninflammatory disorders of vagina: Secondary | ICD-10-CM | POA: Diagnosis not present

## 2022-01-11 DIAGNOSIS — B379 Candidiasis, unspecified: Secondary | ICD-10-CM | POA: Diagnosis not present

## 2022-01-11 DIAGNOSIS — L292 Pruritus vulvae: Secondary | ICD-10-CM | POA: Diagnosis not present

## 2022-01-11 DIAGNOSIS — Z30011 Encounter for initial prescription of contraceptive pills: Secondary | ICD-10-CM

## 2022-01-11 LAB — POCT WET PREP (WET MOUNT): Clue Cells Wet Prep Whiff POC: NEGATIVE

## 2022-01-11 LAB — POCT URINE PREGNANCY: Preg Test, Ur: NEGATIVE

## 2022-01-11 MED ORDER — FLUCONAZOLE 150 MG PO TABS: ORAL_TABLET | ORAL | 1 refills | Status: DC

## 2022-01-11 MED ORDER — NORETHIN ACE-ETH ESTRAD-FE 1-20 MG-MCG PO TABS: 1.0000 | ORAL_TABLET | Freq: Every day | ORAL | 11 refills | Status: DC

## 2022-01-11 MED ORDER — CLOTRIMAZOLE-BETAMETHASONE 1-0.05 % EX CREA: 1.0000 | TOPICAL_CREAM | Freq: Two times a day (BID) | CUTANEOUS | 0 refills | Status: DC

## 2022-01-11 NOTE — Progress Notes (Signed)
  Subjective:     Patient ID: Christina Conley, female   DOB: 30-Jan-1996, 26 y.o.   MRN: [id number]  HPI Christina Conley is a 26 year old black female, single, [id number], in complaining of labia itching and irritated. She wants to get on birth control, she is thinking pills.  Last pap was negative 09/17/20.  Review of Systems +itching of labia and feels irritated Reviewed past medical,surgical, social and family history. Reviewed medications and allergies.     Objective:   Physical Exam BP 109/64 (BP Location: Left Arm, Patient Position: Sitting, Cuff Size: Normal)   Pulse 73   Ht 5\' 11"  (1.803 m)   Wt 209 lb (94.8 kg)   LMP 12/25/2021   Breastfeeding No   BMI 29.15 kg/m     UPT is negative Skin warm and dry.Pelvic: external genitalia is normal in appearance no lesions, vagina: white discharge with odor,urethra has no lesions or masses noted, cervix:smooth and bulbous, uterus: normal size, shape and contour, non tender, no masses felt, adnexa: no masses or tenderness noted. Bladder is non tender and no masses felt. Wet prep: + yeast and +WBCs. Fall risk is low  Upstream - 01/11/22 1519       Pregnancy Intention Screening   Does the patient want to become pregnant in the next year? No    Does the patient's partner want to become pregnant in the next year? No    Would the patient like to discuss contraceptive options today? Yes      Contraception Wrap Up   Current Method Female Condom    End Method Oral Contraceptive    Contraception Counseling Provided Yes    How was the end contraceptive method provided? Prescription            Examination chaperoned by 01/13/22 LPN  Assessment:     1. Pregnancy examination or test, negative result  2. Vulvar itching Will rx Lotrisone   3. Yeast infection Will rx diflucan 150 mg 1 now and 1 in 3 days if needed   4. Vaginal discharge CV swab sent for GC/CHL,trich,BV and yeast   5. Encounter for initial prescription of contraceptive  pills Will rx junel 1/20,can start today and use condoms  Meds ordered this encounter  Medications   fluconazole (DIFLUCAN) 150 MG tablet    Sig: Take 1 now and 1 in 3 days    Dispense:  2 tablet    Refill:  1    Order Specific Question:   Supervising Provider    Answer:   02-26-2000 H [[id number]]   norethindrone-ethinyl estradiol-FE (LOESTRIN FE) 1-20 MG-MCG tablet    Sig: Take 1 tablet by mouth daily.    Dispense:  28 tablet    Refill:  11    Order Specific Question:   Supervising Provider    Answer:   Duane Lope H [[id number]]   clotrimazole-betamethasone (LOTRISONE) cream    Sig: Apply 1 Application topically 2 (two) times daily.    Dispense:  30 g    Refill:  0    Order Specific Question:   Supervising Provider    Answer:   Duane Lope [[id number]]       Plan:     Follow up prn

## 2022-01-13 LAB — CERVICOVAGINAL ANCILLARY ONLY
Bacterial Vaginitis (gardnerella): POSITIVE — AB
Candida Glabrata: NEGATIVE
Candida Vaginitis: POSITIVE — AB
Chlamydia: NEGATIVE
Comment: NEGATIVE
Comment: NEGATIVE
Comment: NEGATIVE
Comment: NEGATIVE
Comment: NEGATIVE
Comment: NORMAL
Neisseria Gonorrhea: NEGATIVE
Trichomonas: NEGATIVE

## 2022-01-17 ENCOUNTER — Other Ambulatory Visit: Payer: Self-pay | Admitting: Adult Health

## 2022-01-17 MED ORDER — METRONIDAZOLE 500 MG PO TABS: 500.0000 mg | ORAL_TABLET | Freq: Two times a day (BID) | ORAL | 0 refills | Status: DC

## 2022-01-17 NOTE — Progress Notes (Signed)
+  BV on vaginal swab will rx flagyl 

## 2022-01-24 DIAGNOSIS — F320 Major depressive disorder, single episode, mild: Secondary | ICD-10-CM | POA: Diagnosis not present

## 2022-02-06 DIAGNOSIS — F320 Major depressive disorder, single episode, mild: Secondary | ICD-10-CM | POA: Diagnosis not present

## 2022-02-11 DIAGNOSIS — F320 Major depressive disorder, single episode, mild: Secondary | ICD-10-CM | POA: Diagnosis not present

## 2022-02-21 NOTE — L&D Delivery Note (Signed)
OB/GYN Faculty Practice Delivery Note  Christina Conley is a 27 y.o. [phone] s/p SVD at [redacted]w[redacted]d. She was admitted for IOL for di-di twin gestation.   ROM: 3h 60m with clear/bloody fluid for twin A. Clear fluid for twin B within 1 minute of delivery. GBS Status: --/Positive (08/15 1400) Maximum Maternal Temperature: Temp (24hrs), Avg:98.2 F (36.8 C), Min:97.7 F (36.5 C), Max:98.8 F (37.1 C)   Labor Progress: Initial SVE: 4.5/70/-1 with both twins confirmed vertex by U/S. AROM and Pitocin required. She then progressed to complete.   Delivery Date/Time: Twin A: 10/28/22 1622/Twin B: 1649 Delivery:  Twin A: Called to room and patient was complete and pushing. Head delivered OA to ROA. No nuchal cord present. Shoulder and body delivered in usual fashion. Infant with spontaneous cry, placed on mother's abdomen, dried and stimulated. Cord clamped x 2 after 1-minute delay, and cut by FOB. Cord blood drawn then cord clamped.  Twin B: After delivery of twin A, twin B had bulgy bag but presenting parts felt to be feet. Ultrasound used to confirm that twin B was now breech. ECV attempted x2 by Dr. Ashok Pall which was unsuccessful. FHT remained Cat I. Dr. Despina Hidden then called for breech delivery. Once he was present, AROM of twin B's bag performed. Babe quickly descended. FHT declined dramatically, so mom was instructed to push strongly. After two pushes, babe presented in complete breech position. Legs delivered quickly and grasped hips to turn back anterior. Mom continued pushing and R arm was delivered, then rest of babe delivered spontaneously. Terminal meconium. Cord clamped x2 and cut after 30 seconds so that babe could be taken to the warmer.  Placentas delivered spontaneously with gentle cord traction. Trailing membranes present, so LUS was performed. Found no retained membranes or clots. Fundus firm with massage and Pitocin. TXA given prophylactically given hx of and high risk for hemorrhage. Labia, perineum,  vagina, and cervix inspected with no lacerations.   Baby Weight: pending  Placenta: 3 vessel, intact. Sent to L&D Complications: Twin B breech delivery Lacerations: None EBL: 250 mL Anesthesia: epidural  Infant:  APGAR (1 MIN):    Glorya, Candell [[id number]]  [address] [place] [[id number]]     APGAR (5 MINS):    Jalyla, Fiscus [[id number]]  [address] [place] [[id number]]     APGAR (10 MINS):    Shravani, Eun [[id number]]     Tameki, Niess [[id number]]      Joanne Gavel, MD [facility] Family Medicine Fellow, [facility] for [facility], [facility] 10/28/2022, 5:44 PM

## 2022-03-07 ENCOUNTER — Encounter: Payer: Medicaid Other | Admitting: Internal Medicine

## 2022-03-08 ENCOUNTER — Encounter: Payer: Self-pay | Admitting: *Deleted

## 2022-03-08 ENCOUNTER — Other Ambulatory Visit: Payer: Self-pay | Admitting: Adult Health

## 2022-03-08 ENCOUNTER — Ambulatory Visit (INDEPENDENT_AMBULATORY_CARE_PROVIDER_SITE_OTHER): Payer: Medicaid Other | Admitting: *Deleted

## 2022-03-08 VITALS — BP 119/68 | HR 82 | Ht 71.0 in | Wt 205.8 lb

## 2022-03-08 DIAGNOSIS — Z3201 Encounter for pregnancy test, result positive: Secondary | ICD-10-CM | POA: Diagnosis not present

## 2022-03-08 LAB — POCT URINE PREGNANCY: Preg Test, Ur: POSITIVE — AB

## 2022-03-08 MED ORDER — PRENATAL PLUS 27-1 MG PO TABS: 1.0000 | ORAL_TABLET | Freq: Every day | ORAL | 12 refills | Status: DC

## 2022-03-08 NOTE — Progress Notes (Signed)
   NURSE VISIT- PREGNANCY CONFIRMATION   SUBJECTIVE:  Christina Conley is a 27 y.o. [phone] female at [redacted]w[redacted]d by certain LMP of Patient's last menstrual period was 01/28/2022 (exact date). Here for pregnancy confirmation.  Home pregnancy test: positive x 6   She reports cramping.  She is not taking prenatal vitamins.    OBJECTIVE:  BP 119/68 (BP Location: Right Arm, Patient Position: Sitting, Cuff Size: Normal)   Pulse 82   Ht 5\' 11"  (1.803 m)   Wt 205 lb 12.8 oz (93.4 kg)   LMP 01/28/2022 (Exact Date)   Breastfeeding No   BMI 28.70 kg/m   Appears well, in no apparent distress  Results for orders placed or performed in visit on 03/08/22 (from the past 24 hour(s))  POCT urine pregnancy   Collection Time: 03/08/22 11:14 AM  Result Value Ref Range   Preg Test, Ur Positive (A) Negative    ASSESSMENT: Positive pregnancy test, [redacted]w[redacted]d by LMP    PLAN: Schedule for dating ultrasound in 3 weeks Prenatal vitamins: note routed to [place] to send prescription   Nausea medicines: not currently needed   OB packet given: Yes  Janece Canterbury  03/08/2022 11:15 AM

## 2022-03-08 NOTE — Progress Notes (Signed)
Rx sent for prenatal vitamin 

## 2022-03-23 ENCOUNTER — Encounter: Payer: Self-pay | Admitting: Internal Medicine

## 2022-03-23 ENCOUNTER — Ambulatory Visit (INDEPENDENT_AMBULATORY_CARE_PROVIDER_SITE_OTHER): Payer: Medicaid Other | Admitting: Internal Medicine

## 2022-03-23 VITALS — BP 116/79 | HR 79 | Ht 71.0 in | Wt 213.4 lb

## 2022-03-23 DIAGNOSIS — Z0001 Encounter for general adult medical examination with abnormal findings: Secondary | ICD-10-CM

## 2022-03-23 DIAGNOSIS — F3342 Major depressive disorder, recurrent, in full remission: Secondary | ICD-10-CM

## 2022-03-23 DIAGNOSIS — Z862 Personal history of diseases of the blood and blood-forming organs and certain disorders involving the immune mechanism: Secondary | ICD-10-CM | POA: Diagnosis not present

## 2022-03-23 DIAGNOSIS — Z2821 Immunization not carried out because of patient refusal: Secondary | ICD-10-CM

## 2022-03-23 DIAGNOSIS — K429 Umbilical hernia without obstruction or gangrene: Secondary | ICD-10-CM

## 2022-03-23 NOTE — Progress Notes (Addendum)
Established Patient Office Visit  Subjective:  Patient ID: Christina Conley, female    DOB: November 12, 1995  Age: 27 y.o. MRN: [id number]  CC:  Chief Complaint  Patient presents with   Annual Exam    Patient does have a hernia    HPI Christina Conley is a 27 y.o. female with past medical history of IDA and umbilical hernia who presents for annual physical.  Umbilical hernia/diastases recti: She complains of discomfort around umbilical area.  She has had general surgery evaluation for this and was told about conservative treatment.  She is pregnant currently and has been having worsening of her abdominal discomfort recently.  Denies any umbilical discharge currently.  She does have to lift her babies who are 30 lbs.  Postpartum depression/MDD: She denies anhedonia currently. She is undergoing BH therapy currently.  She denies any SI or HI currently.  She has good support from her partner for taking care of her children.  Pregnancy: She complains of mild fatigue and thinks of IDA.  She has started taking prenatal vitamins.  She denies any active bleeding currently.  Of note, she had BV and candidal vaginitis, noted on vaginal swab in 11/23.  She had taken fluconazole, which resolved her symptoms, but did not get metronidazole. She is going to see Ob./Gyn. in the next week.   Past Medical History:  Diagnosis Date   Acute blood loss anemia 08/12/2020   EBL with delivery 657mL PO iron, IV venofer administered postpartum 1 U pRBCs Patient asymptomatic   Asthma    child.  No meds > 2 years   BV (bacterial vaginosis) 07/02/2012   Contraceptive management 05/27/2015   Environmental allergies    Gonorrhea 08/2015   Irregular intermenstrual bleeding 05/27/2015   LLQ pain 05/27/2015   Round ligament pain 12/09/2013   Vaginal discharge 12/09/2013    Past Surgical History:  Procedure Laterality Date   NO PAST SURGERIES      Family History  Problem Relation Age of Onset   Crohn's disease Paternal  Grandmother    Hypertension Maternal Grandmother    Miscarriages / Stillbirths Maternal Grandmother    Hypertension Maternal Grandfather    Hyperlipidemia Maternal Grandfather    Other Father        problems with intestines   Crohn's disease Mother     Social History   Socioeconomic History   Marital status: Single    Spouse name: Not on file   Number of children: Not on file   Years of education: Not on file   Highest education level: Not on file  Occupational History   Not on file  Tobacco Use   Smoking status: Every Day    Packs/day: 0.25    Years: 0.50    Total pack years: 0.13    Types: Cigarettes   Smokeless tobacco: Never  Vaping Use   Vaping Use: Never used  Substance and Sexual Activity   Alcohol use: Yes    Comment: occ   Drug use: No    Types: Marijuana    Comment: quit 2015   Sexual activity: Yes    Birth control/protection: Condom, Pill  Other Topics Concern   Not on file  Social History Narrative   Not on file   Social Determinants of Health   Financial Resource Strain: Low Risk  (09/17/2020)   Overall Financial Resource Strain (CARDIA)    Difficulty of Paying Living Expenses: Not hard at all  Food Insecurity: No Food Insecurity (09/17/2020)  Hunger Vital Sign    Worried About Running Out of Food in the Last Year: Never true    Ran Out of Food in the Last Year: Never true  Transportation Needs: No Transportation Needs (09/17/2020)   PRAPARE - Administrator, Civil Service (Medical): No    Lack of Transportation (Non-Medical): No  Physical Activity: Inactive (09/17/2020)   Exercise Vital Sign    Days of Exercise per Week: 0 days    Minutes of Exercise per Session: 0 min  Stress: Stress Concern Present (09/17/2020)   [employer] of Occupational Health - Occupational Stress Questionnaire    Feeling of Stress : Rather much  Social Connections: Moderately Integrated (09/17/2020)   Social Connection and Isolation Panel [NHANES]     Frequency of Communication with Friends and Family: More than three times a week    Frequency of Social Gatherings with Friends and Family: Never    Attends Religious Services: More than 4 times per year    Active Member of [employer] or Organizations: No    Attends Banker Meetings: Never    Marital Status: Living with partner  Intimate Partner Violence: Not At Risk (09/17/2020)   Humiliation, Afraid, Rape, and Kick questionnaire    Fear of Current or Ex-Partner: No    Emotionally Abused: No    Physically Abused: No    Sexually Abused: No    Outpatient Medications Prior to Visit  Medication Sig Dispense Refill   prenatal vitamin w/FE, FA (PRENATAL 1 + 1) 27-1 MG TABS tablet Take 1 tablet by mouth daily at 12 noon. 30 tablet 12   No facility-administered medications prior to visit.    Allergies  Allergen Reactions   Amoxicillin Hives   Ampicillin Hives   Penicillins Hives    Has patient had a PCN reaction causing immediate rash, facial/tongue/throat swelling, SOB or lightheadedness with hypotension: Yes Has patient had a PCN reaction causing severe rash involving mucus membranes or skin necrosis: No Has patient had a PCN reaction that required hospitalization No Has patient had a PCN reaction occurring within the last 10 years: unknown If all of the above answers are "NO", then may proceed with Cephalosporin use.     ROS Review of Systems  Constitutional:  Negative for chills and fever.  HENT:  Negative for congestion, sinus pressure, sinus pain and sore throat.   Eyes:  Negative for pain and discharge.  Respiratory:  Negative for cough and shortness of breath.   Cardiovascular:  Negative for chest pain and palpitations.  Gastrointestinal:  Negative for constipation, diarrhea, nausea and vomiting.       Periumbilical discomfort  Endocrine: Negative for polydipsia and polyuria.  Genitourinary:  Negative for dysuria and hematuria.  Musculoskeletal:  Negative for neck  pain and neck stiffness.  Skin:  Negative for rash.  Neurological:  Negative for dizziness and weakness.  Psychiatric/Behavioral:  Positive for sleep disturbance. Negative for agitation and behavioral problems.       Objective:    Physical Exam Vitals reviewed.  Constitutional:      General: She is not in acute distress.    Appearance: She is not diaphoretic.  HENT:     Head: Normocephalic and atraumatic.     Nose: Nose normal.     Mouth/Throat:     Mouth: Mucous membranes are moist.  Eyes:     General: No scleral icterus.    Extraocular Movements: Extraocular movements intact.  Cardiovascular:     Rate  and Rhythm: Normal rate and regular rhythm.     Pulses: Normal pulses.     Heart sounds: No murmur heard. Pulmonary:     Breath sounds: Normal breath sounds. No wheezing or rales.  Abdominal:     Palpations: Abdomen is soft.     Tenderness: There is no abdominal tenderness. There is no right CVA tenderness or left CVA tenderness.     Hernia: A hernia (Umbilical) is present.  Musculoskeletal:     Cervical back: Neck supple. No tenderness.     Right lower leg: No edema.     Left lower leg: No edema.  Skin:    General: Skin is warm.     Findings: No rash.  Neurological:     General: No focal deficit present.     Mental Status: She is alert and oriented to person, place, and time.     Cranial Nerves: No cranial nerve deficit.     Sensory: No sensory deficit.     Motor: No weakness.  Psychiatric:        Mood and Affect: Mood normal.        Behavior: Behavior normal.     BP 116/79 (BP Location: Left Arm, Patient Position: Sitting, Cuff Size: Normal)   Pulse 79   Ht 5\' 11"  (1.803 m)   Wt 213 lb 6.4 oz (96.8 kg)   LMP 01/28/2022 (Exact Date)   SpO2 97%   BMI 29.76 kg/m  Wt Readings from Last 3 Encounters:  03/23/22 213 lb 6.4 oz (96.8 kg)  03/08/22 205 lb 12.8 oz (93.4 kg)  01/11/22 209 lb (94.8 kg)    Lab Results  Component Value Date   TSH 1.602 09/25/2013    Lab Results  Component Value Date   WBC 5.7 10/28/2020   HGB 11.3 10/28/2020   HCT 34.6 10/28/2020   MCV 83 10/28/2020   PLT 352 10/28/2020   Lab Results  Component Value Date   NA 141 10/28/2020   K 4.1 10/28/2020   CO2 22 10/28/2020   GLUCOSE 67 10/28/2020   BUN 12 10/28/2020   CREATININE 0.89 10/28/2020   BILITOT 0.2 (L) 02/02/2020   ALKPHOS 53 02/02/2020   AST 15 02/02/2020   ALT 13 02/02/2020   PROT 7.5 02/02/2020   ALBUMIN 3.6 02/02/2020   CALCIUM 9.2 10/28/2020   ANIONGAP 7 02/02/2020   EGFR 93 10/28/2020   No results found for: "CHOL" No results found for: "HDL" No results found for: "LDLCALC" No results found for: "TRIG" No results found for: "CHOLHDL" No results found for: "HGBA1C"    Assessment & Plan:   Problem List Items Addressed This Visit       Other   Umbilical hernia without obstruction and without gangrene    Usually benign Advised to avoid heavy lifting, but she has 3 children and is unable to avoid straining Has seen General Surgeon - would avoid any surgery, especially during her pregnancy Can apply Lotrisone for local itching      MDD (major depressive disorder)    [place] Office Visit from 03/03/2021 in [facility]  PHQ-9 Total Score 10     Undergoing BH therapy for now No concern for SI or HI currently      Encounter for general adult medical examination with abnormal findings - Primary    Physical exam as documented. Refused flu vaccine.  Tdap given today. Fasting blood tests reviewed from previous visit.  Relevant Orders   Basic Metabolic Panel (BMET)   History of iron deficiency anemia    Check CBC Advised to continue prenatal vitamins with iron      Relevant Orders   CBC with Differential/Platelet   Influenza vaccine refused    No orders of the defined types were placed in this encounter.   Follow-up: Return in about 1 year (around 03/24/2023) for Annual physical.    Lindell Spar, MD

## 2022-03-23 NOTE — Patient Instructions (Signed)
Please continue taking prenatal vitamins.  Please apply Lotrisone cream around umbilical area for soreness/itching.  Please avoid heavy lifting and frequent bending.  Please discuss with Ob./Gyn. for BV treatment.

## 2022-03-23 NOTE — Addendum Note (Signed)
Addended byIhor Dow on: 03/23/2022 01:36 PM   Modules accepted: Orders

## 2022-03-23 NOTE — Assessment & Plan Note (Signed)
Physical exam as documented. Refused flu vaccine.  Tdap given today. Fasting blood tests reviewed from previous visit.

## 2022-03-23 NOTE — Assessment & Plan Note (Signed)
Christina Conley Office Visit from 03/03/2021 in [facility]  PHQ-9 Total Score 10      Undergoing BH therapy for now No concern for SI or HI currently

## 2022-03-23 NOTE — Assessment & Plan Note (Signed)
Check CBC Advised to continue prenatal vitamins with iron

## 2022-03-23 NOTE — Assessment & Plan Note (Addendum)
Usually benign Advised to avoid heavy lifting, but she has 3 children and is unable to avoid straining Has seen General Surgeon - would avoid any surgery, especially during her pregnancy Can apply Lotrisone for local itching

## 2022-03-24 ENCOUNTER — Telehealth: Payer: Self-pay | Admitting: Internal Medicine

## 2022-03-24 LAB — CBC WITH DIFFERENTIAL/PLATELET
Basophils Absolute: 0 10*3/uL (ref 0.0–0.2)
Basos: 0 %
EOS (ABSOLUTE): 0 10*3/uL (ref 0.0–0.4)
Eos: 0 %
Hematocrit: 35.4 % (ref 34.0–46.6)
Hemoglobin: 12 g/dL (ref 11.1–15.9)
Immature Grans (Abs): 0 10*3/uL (ref 0.0–0.1)
Immature Granulocytes: 0 %
Lymphocytes Absolute: 2.3 10*3/uL (ref 0.7–3.1)
Lymphs: 25 %
MCH: 29.1 pg (ref 26.6–33.0)
MCHC: 33.9 g/dL (ref 31.5–35.7)
MCV: 86 fL (ref 79–97)
Monocytes Absolute: 0.5 10*3/uL (ref 0.1–0.9)
Monocytes: 6 %
Neutrophils Absolute: 6.4 10*3/uL (ref 1.4–7.0)
Neutrophils: 69 %
Platelets: 354 10*3/uL (ref 150–450)
RBC: 4.12 x10E6/uL (ref 3.77–5.28)
RDW: 13.8 % (ref 11.7–15.4)
WBC: 9.4 10*3/uL (ref 3.4–10.8)

## 2022-03-24 LAB — BASIC METABOLIC PANEL
BUN/Creatinine Ratio: 15 (ref 9–23)
BUN: 12 mg/dL (ref 6–20)
CO2: 20 mmol/L (ref 20–29)
Calcium: 9.2 mg/dL (ref 8.7–10.2)
Chloride: 102 mmol/L (ref 96–106)
Creatinine, Ser: 0.79 mg/dL (ref 0.57–1.00)
Glucose: 73 mg/dL (ref 70–99)
Potassium: 3.9 mmol/L (ref 3.5–5.2)
Sodium: 138 mmol/L (ref 134–144)
eGFR: 106 mL/min/{1.73_m2} (ref >59)

## 2022-03-24 NOTE — Telephone Encounter (Signed)
Patient returning lab call

## 2022-03-24 NOTE — Telephone Encounter (Signed)
Spoke to patient

## 2022-03-29 ENCOUNTER — Other Ambulatory Visit: Payer: Self-pay | Admitting: Obstetrics & Gynecology

## 2022-03-29 DIAGNOSIS — O3680X0 Pregnancy with inconclusive fetal viability, not applicable or unspecified: Secondary | ICD-10-CM

## 2022-03-30 ENCOUNTER — Other Ambulatory Visit: Payer: Self-pay | Admitting: Obstetrics & Gynecology

## 2022-03-30 ENCOUNTER — Ambulatory Visit (INDEPENDENT_AMBULATORY_CARE_PROVIDER_SITE_OTHER): Payer: Medicaid Other

## 2022-03-30 DIAGNOSIS — O3680X0 Pregnancy with inconclusive fetal viability, not applicable or unspecified: Secondary | ICD-10-CM

## 2022-03-30 DIAGNOSIS — Z3A08 8 weeks gestation of pregnancy: Secondary | ICD-10-CM | POA: Diagnosis not present

## 2022-03-30 DIAGNOSIS — O30041 Twin pregnancy, dichorionic/diamniotic, first trimester: Secondary | ICD-10-CM

## 2022-03-30 NOTE — Progress Notes (Signed)
[place] 7+ 6 wks,DC/DA TWINS,normal ovaries BABY A: left,CRL 15.10 mm,FHR 157 bpm BABY B: right,CRL 14.62 mm,FHR 160 bpm

## 2022-04-18 ENCOUNTER — Ambulatory Visit (INDEPENDENT_AMBULATORY_CARE_PROVIDER_SITE_OTHER): Payer: Medicaid Other | Admitting: Obstetrics & Gynecology

## 2022-04-18 ENCOUNTER — Encounter: Payer: Self-pay | Admitting: Obstetrics & Gynecology

## 2022-04-18 VITALS — BP 133/78 | HR 113

## 2022-04-18 DIAGNOSIS — Z3A10 10 weeks gestation of pregnancy: Secondary | ICD-10-CM

## 2022-04-18 DIAGNOSIS — O30041 Twin pregnancy, dichorionic/diamniotic, first trimester: Secondary | ICD-10-CM | POA: Diagnosis not present

## 2022-04-18 DIAGNOSIS — O209 Hemorrhage in early pregnancy, unspecified: Secondary | ICD-10-CM

## 2022-04-18 NOTE — Progress Notes (Signed)
   GYN VISIT Patient name: Christina Conley MRN [id number]  Date of birth: 1995/02/26 Chief Complaint:   Vaginal Bleeding (Intercourse last night, blood in toilet this morning)  History of Present Illness:   [name] is a 27 y.o. [phone] @ ~ 10wks by LMP with di/di twins who showed up in office due to vaginal bleeding.  Pt reports IC last night.  Woke up thinking it was urine and states it was "pouring out."  It has started to slow down, but still saw some blood when she wiped.  Denies pelvic or abdominal pain.  No other acute complaints.  Patient's last menstrual period was 01/28/2022 (exact date).     03/23/2022    1:07 PM 06/23/2021    3:37 PM 03/03/2021    2:39 PM 02/03/2021   10:18 AM 12/17/2020    4:42 PM  Depression screen PHQ 2/9  Decreased Interest 0 0 0 0 0  Down, Depressed, Hopeless 0 0 1 1 0  PHQ - 2 Score 0 0 1 1 0  Altered sleeping   3 3 0  Tired, decreased energy   3 3 0  Change in appetite   3 0 0  Feeling bad or failure about yourself    0 0 0  Trouble concentrating   0 0 0  Moving slowly or fidgety/restless   0 0 0  Suicidal thoughts   0 0 0  PHQ-9 Score   10 7 0  Difficult doing work/chores   Somewhat difficult Somewhat difficult Not difficult at all     Review of Systems:   Pertinent items are noted in HPI Denies fever/chills, dizziness, headaches, visual disturbances, fatigue, shortness of breath, chest pain, abdominal pain, vomiting. Pertinent History Reviewed:  Reviewed past medical,surgical, social, obstetrical and family history.  Reviewed problem list, medications and allergies. Physical Assessment:   Vitals:   04/18/22 0918  BP: 133/78  Pulse: (!) 113  There is no height or weight on file to calculate BMI.       Physical Examination:   General appearance: alert, well appearing, and in no distress  Psych: mood appropriate, normal affect  Skin: warm & dry   Cardiovascular: normal heart rate noted  Respiratory: normal respiratory effort, no  distress  Abdomen: soft, non-tender   Pelvic: VULVA: normal appearing vulva with no masses, tenderness or lesions, VAGINA: bloody mucus-like discharge noted in vault, <10cc, CERVIX: normal appearing cervix- appears closed, no active bleeding.  SVE: cervix closed  Extremities: no edema, no calf tenderness bialterally  Chaperone: Latisha Cresenzo    BSUS:  Di/Di twin pregnancy with viable twins x 2,  FHR noted for both tiwns  Assessment & Plan:  1) Vaginal bleeding in first trimester -reassured pt of viable pregnancy -advised pelvic rest for at least 1-2wks -anticipate the bleeding may still continue for a few days -should she note worsening bleeding along with cramping/abdominal pain advised to go to [facility]U -questions/concerns were addressed  -f/u as scheduled in March  2) di/di twin pregnancy -previously confirmed di/di by early ultrasound  Return for as scheduled.   Janyth Pupa, DO Attending [place], [facility] for [employer], [place]

## 2022-05-02 ENCOUNTER — Other Ambulatory Visit: Payer: Self-pay | Admitting: Obstetrics & Gynecology

## 2022-05-02 DIAGNOSIS — O30042 Twin pregnancy, dichorionic/diamniotic, second trimester: Secondary | ICD-10-CM

## 2022-05-02 DIAGNOSIS — Z3682 Encounter for antenatal screening for nuchal translucency: Secondary | ICD-10-CM

## 2022-05-03 ENCOUNTER — Encounter: Payer: Self-pay | Admitting: Women's Health

## 2022-05-03 ENCOUNTER — Ambulatory Visit (INDEPENDENT_AMBULATORY_CARE_PROVIDER_SITE_OTHER): Payer: Medicaid Other

## 2022-05-03 ENCOUNTER — Encounter: Payer: Medicaid Other | Admitting: *Deleted

## 2022-05-03 ENCOUNTER — Ambulatory Visit (INDEPENDENT_AMBULATORY_CARE_PROVIDER_SITE_OTHER): Payer: Medicaid Other | Admitting: Women's Health

## 2022-05-03 VITALS — BP 126/72 | HR 91 | Wt 214.8 lb

## 2022-05-03 DIAGNOSIS — Z3A13 13 weeks gestation of pregnancy: Secondary | ICD-10-CM

## 2022-05-03 DIAGNOSIS — O30041 Twin pregnancy, dichorionic/diamniotic, first trimester: Secondary | ICD-10-CM | POA: Diagnosis not present

## 2022-05-03 DIAGNOSIS — O30042 Twin pregnancy, dichorionic/diamniotic, second trimester: Secondary | ICD-10-CM

## 2022-05-03 DIAGNOSIS — O30049 Twin pregnancy, dichorionic/diamniotic, unspecified trimester: Secondary | ICD-10-CM | POA: Insufficient documentation

## 2022-05-03 DIAGNOSIS — Z3A12 12 weeks gestation of pregnancy: Secondary | ICD-10-CM | POA: Diagnosis not present

## 2022-05-03 DIAGNOSIS — Z3682 Encounter for antenatal screening for nuchal translucency: Secondary | ICD-10-CM

## 2022-05-03 DIAGNOSIS — Z131 Encounter for screening for diabetes mellitus: Secondary | ICD-10-CM

## 2022-05-03 DIAGNOSIS — O0991 Supervision of high risk pregnancy, unspecified, first trimester: Secondary | ICD-10-CM | POA: Diagnosis not present

## 2022-05-03 DIAGNOSIS — O0990 Supervision of high risk pregnancy, unspecified, unspecified trimester: Secondary | ICD-10-CM | POA: Insufficient documentation

## 2022-05-03 DIAGNOSIS — O0992 Supervision of high risk pregnancy, unspecified, second trimester: Secondary | ICD-10-CM

## 2022-05-03 MED ORDER — ASPIRIN 81 MG PO TBEC: 81.0000 mg | DELAYED_RELEASE_TABLET | Freq: Every day | ORAL | 3 refills | Status: DC

## 2022-05-03 MED ORDER — DOXYLAMINE-PYRIDOXINE 10-10 MG PO TBEC: DELAYED_RELEASE_TABLET | ORAL | 6 refills | Status: DC

## 2022-05-03 MED ORDER — BLOOD PRESSURE MONITOR MISC: 0 refills | Status: DC

## 2022-05-03 NOTE — Patient Instructions (Signed)
Christina Conley, thank you for choosing our office today! We appreciate the opportunity to meet your healthcare needs. You may receive a short survey by mail, e-mail, or through [employer]. If you are happy with your care we would appreciate if you could take just a few minutes to complete the survey questions. We read all of your comments and take your feedback very seriously. Thank you again for choosing our office.  [facility] at [place] at [facility] ([place], [place] [address]) Entrance C, located off of [address] parking   Nausea & Vomiting Have saltine crackers or pretzels by your bed and eat a few bites before you raise your head out of bed in the morning Eat small frequent meals throughout the day instead of large meals Drink plenty of fluids throughout the day to stay hydrated, just don't drink a lot of fluids with your meals.  This can make your stomach fill up faster making you feel sick Do not brush your teeth right after you eat Products with real ginger are good for nausea, like ginger ale and ginger hard candy Make sure it says made with real ginger! Sucking on sour candy like lemon heads is also good for nausea If your prenatal vitamins make you nauseated, take them at night so you will sleep through the nausea Sea Bands If you feel like you need medicine for the nausea & vomiting please let us know If you are unable to keep any fluids or food down please let us know   Constipation Drink plenty of fluid, preferably water, throughout the day Eat foods high in fiber such as fruits, vegetables, and grains Exercise, such as walking, is a good way to keep your bowels regular Drink warm fluids, especially warm prune juice, or decaf coffee Eat a 1/2 cup of real oatmeal (not instant), 1/2 cup applesauce, and 1/2-1 cup warm prune juice every day If needed, you may take Colace (docusate sodium) stool softener  once or twice a day to help keep the stool soft.  If you still are having problems with constipation, you may take Miralax once daily as needed to help keep your bowels regular.   Home Blood Pressure Monitoring for Patients   Your provider has recommended that you check your blood pressure (BP) at least once a week at home. If you do not have a blood pressure cuff at home, one will be provided for you. Contact your provider if you have not received your monitor within 1 week.   Helpful Tips for Accurate Home Blood Pressure Checks  Don't smoke, exercise, or drink caffeine 30 minutes before checking your BP Use the restroom before checking your BP (a full bladder can raise your pressure) Relax in a comfortable upright chair Feet on the ground Left arm resting comfortably on a flat surface at the level of your heart Legs uncrossed Back supported Sit quietly and don't talk Place the cuff on your bare arm Adjust snuggly, so that only two fingertips can fit between your skin and the top of the cuff Check 2 readings separated by at least one minute Keep a log of your BP readings For a visual, please reference this diagram: http://ccnc.care/bpdiagram  Provider Name: [facility]     Phone: [phone]  Zone 1: ALL CLEAR  Continue to monitor your symptoms:  BP reading is less than 140 (top number) or less than 90 (bottom  number)  No right upper stomach pain No headaches or seeing spots No feeling nauseated or throwing up No swelling in face and hands  Zone 2: CAUTION Call your doctor's office for any of the following:  BP reading is greater than 140 (top number) or greater than 90 (bottom number)  Stomach pain under your ribs in the middle or right side Headaches or seeing spots Feeling nauseated or throwing up Swelling in face and hands  Zone 3: EMERGENCY  Seek immediate medical care if you have any of the following:  BP reading is greater than160 (top number) or greater than  110 (bottom number) Severe headaches not improving with Tylenol Serious difficulty catching your breath Any worsening symptoms from Zone 2    First Trimester of Pregnancy The first trimester of pregnancy is from week 1 until the end of week 12 (months 1 through 3). A week after a sperm fertilizes an egg, the egg will implant on the wall of the uterus. This embryo will begin to develop into a baby. Genes from you and your partner are forming the baby. The female genes determine whether the baby is a boy or a girl. At 6-8 weeks, the eyes and face are formed, and the heartbeat can be seen on ultrasound. At the end of 12 weeks, all the baby's organs are formed.  Now that you are pregnant, you will want to do everything you can to have a healthy baby. Two of the most important things are to get good prenatal care and to follow your health care provider's instructions. Prenatal care is all the medical care you receive before the baby's birth. This care will help prevent, find, and treat any problems during the pregnancy and childbirth. BODY CHANGES Your body goes through many changes during pregnancy. The changes vary from woman to woman.  You may gain or lose a couple of pounds at first. You may feel sick to your stomach (nauseous) and throw up (vomit). If the vomiting is uncontrollable, call your health care provider. You may tire easily. You may develop headaches that can be relieved by medicines approved by your health care provider. You may urinate more often. Painful urination may mean you have a bladder infection. You may develop heartburn as a result of your pregnancy. You may develop constipation because certain hormones are causing the muscles that push waste through your intestines to slow down. You may develop hemorrhoids or swollen, bulging veins (varicose veins). Your breasts may begin to grow larger and become tender. Your nipples may stick out more, and the tissue that surrounds them  (areola) may become darker. Your gums may bleed and may be sensitive to brushing and flossing. Dark spots or blotches (chloasma, mask of pregnancy) may develop on your face. This will likely fade after the baby is born. Your menstrual periods will stop. You may have a loss of appetite. You may develop cravings for certain kinds of food. You may have changes in your emotions from day to day, such as being excited to be pregnant or being concerned that something may go wrong with the pregnancy and baby. You may have more vivid and strange dreams. You may have changes in your hair. These can include thickening of your hair, rapid growth, and changes in texture. Some women also have hair loss during or after pregnancy, or hair that feels dry or thin. Your hair will most likely return to normal after your baby is born. WHAT TO EXPECT AT YOUR PRENATAL  VISITS During a routine prenatal visit: You will be weighed to make sure you and the baby are growing normally. Your blood pressure will be taken. Your abdomen will be measured to track your baby's growth. The fetal heartbeat will be listened to starting around week 10 or 12 of your pregnancy. Test results from any previous visits will be discussed. Your health care provider may ask you: How you are feeling. If you are feeling the baby move. If you have had any abnormal symptoms, such as leaking fluid, bleeding, severe headaches, or abdominal cramping. If you have any questions. Other tests that may be performed during your first trimester include: Blood tests to find your blood type and to check for the presence of any previous infections. They will also be used to check for low iron levels (anemia) and Rh antibodies. Later in the pregnancy, blood tests for diabetes will be done along with other tests if problems develop. Urine tests to check for infections, diabetes, or protein in the urine. An ultrasound to confirm the proper growth and development  of the baby. An amniocentesis to check for possible genetic problems. Fetal screens for spina bifida and Down syndrome. You may need other tests to make sure you and the baby are doing well. HOME CARE INSTRUCTIONS  Medicines Follow your health care provider's instructions regarding medicine use. Specific medicines may be either safe or unsafe to take during pregnancy. Take your prenatal vitamins as directed. If you develop constipation, try taking a stool softener if your health care provider approves. Diet Eat regular, well-balanced meals. Choose a variety of foods, such as meat or vegetable-based protein, fish, milk and low-fat dairy products, vegetables, fruits, and whole grain breads and cereals. Your health care provider will help you determine the amount of weight gain that is right for you. Avoid raw meat and uncooked cheese. These carry germs that can cause birth defects in the baby. Eating four or five small meals rather than three large meals a day may help relieve nausea and vomiting. If you start to feel nauseous, eating a few soda crackers can be helpful. Drinking liquids between meals instead of during meals also seems to help nausea and vomiting. If you develop constipation, eat more high-fiber foods, such as fresh vegetables or fruit and whole grains. Drink enough fluids to keep your urine clear or pale yellow. Activity and Exercise Exercise only as directed by your health care provider. Exercising will help you: Control your weight. Stay in shape. Be prepared for labor and delivery. Experiencing pain or cramping in the lower abdomen or low back is a good sign that you should stop exercising. Check with your health care provider before continuing normal exercises. Try to avoid standing for long periods of time. Move your legs often if you must stand in one place for a long time. Avoid heavy lifting. Wear low-heeled shoes, and practice good posture. You may continue to have sex  unless your health care provider directs you otherwise. Relief of Pain or Discomfort Wear a good support bra for breast tenderness.   Take warm sitz baths to soothe any pain or discomfort caused by hemorrhoids. Use hemorrhoid cream if your health care provider approves.   Rest with your legs elevated if you have leg cramps or low back pain. If you develop varicose veins in your legs, wear support hose. Elevate your feet for 15 minutes, 3-4 times a day. Limit salt in your diet. Prenatal Care Schedule your prenatal visits by the  twelfth week of pregnancy. They are usually scheduled monthly at first, then more often in the last 2 months before delivery. Write down your questions. Take them to your prenatal visits. Keep all your prenatal visits as directed by your health care provider. Safety Wear your seat belt at all times when driving. Make a list of emergency phone numbers, including numbers for family, friends, the hospital, and police and fire departments. General Tips Ask your health care provider for a referral to a local prenatal education class. Begin classes no later than at the beginning of month 6 of your pregnancy. Ask for help if you have counseling or nutritional needs during pregnancy. Your health care provider can offer advice or refer you to specialists for help with various needs. Do not use hot tubs, steam rooms, or saunas. Do not douche or use tampons or scented sanitary pads. Do not cross your legs for long periods of time. Avoid cat litter boxes and soil used by cats. These carry germs that can cause birth defects in the baby and possibly loss of the fetus by miscarriage or stillbirth. Avoid all smoking, herbs, alcohol, and medicines not prescribed by your health care provider. Chemicals in these affect the formation and growth of the baby. Schedule a dentist appointment. At home, brush your teeth with a soft toothbrush and be gentle when you floss. SEEK MEDICAL CARE IF:   You have dizziness. You have mild pelvic cramps, pelvic pressure, or nagging pain in the abdominal area. You have persistent nausea, vomiting, or diarrhea. You have a bad smelling vaginal discharge. You have pain with urination. You notice increased swelling in your face, hands, legs, or ankles. SEEK IMMEDIATE MEDICAL CARE IF:  You have a fever. You are leaking fluid from your vagina. You have spotting or bleeding from your vagina. You have severe abdominal cramping or pain. You have rapid weight gain or loss. You vomit blood or material that looks like coffee grounds. You are exposed to [place] measles and have never had them. You are exposed to fifth disease or chickenpox. You develop a severe headache. You have shortness of breath. You have any kind of trauma, such as from a fall or a car accident. Document Released: 02/01/2001 Document Revised: 06/24/2013 Document Reviewed: 12/18/2012 [facility] Patient Information 2015 [place], [place]. This information is not intended to replace advice given to you by your health care provider. Make sure you discuss any questions you have with your health care provider.

## 2022-05-03 NOTE — Progress Notes (Signed)
INITIAL OBSTETRICAL VISIT Patient name: Christina Conley MRN [id number]  Date of birth: [birth year] Chief Complaint:   Initial Prenatal Visit  History of Present Illness:   Christina Conley is a 27 y.o. [id number] African-American female at 20w5dby [place] 7 weeks with an Estimated Date of Delivery: 11/10/22 being seen today for her initial obstetrical visit.   Patient's last menstrual period was 01/28/2022 (exact date). Her obstetrical history is significant for  SAB x 1, term uncomplicated SVB x 1, [id number] twin SVB x 1 .   Today she reports N/V, requests meds. Brown spotting, denies abnormal discharge, itching/odor/irritation.  .  Last pap 09/17/20. Results were: NILM w/ HRHPV not done     05/03/2022   10:21 AM 03/23/2022    1:07 PM 06/23/2021    3:37 PM 03/03/2021    2:39 PM 02/03/2021   10:18 AM  Depression screen PHQ 2/9  Decreased Interest 1 0 0 0 0  Down, Depressed, Hopeless 1 0 0 1 1  PHQ - 2 Score 2 0 0 1 1  Altered sleeping 0   3 3  Tired, decreased energy '2   3 3  '$ Change in appetite 0   3 0  Feeling bad or failure about yourself  0   0 0  Trouble concentrating 0   0 0  Moving slowly or fidgety/restless 0   0 0  Suicidal thoughts 0   0 0  PHQ-9 Score '4   10 7  '$ Difficult doing work/chores    Somewhat difficult Somewhat difficult        05/03/2022   10:21 AM 02/03/2021   10:19 AM 02/26/2020    2:56 PM 01/14/2020    9:48 AM  GAD 7 : Generalized Anxiety Score  Nervous, Anxious, on Edge '2 1 1 2  '$ Control/stop worrying 0 '1 2 1  '$ Worry too much - different things 0 '1 2 3  '$ Trouble relaxing 0 0 3 1  Restless 0 0 0 0  Easily annoyed or irritable '3 1 2 3  '$ Afraid - awful might happen 0 0 0 2  Total GAD 7 Score '5 4 10 12  '$ Anxiety Difficulty  Somewhat difficult       Review of Systems:   Pertinent items are noted in HPI Denies cramping/contractions, leakage of fluid, vaginal bleeding, abnormal vaginal discharge w/ itching/odor/irritation, headaches, visual changes, shortness  of breath, chest pain, abdominal pain, severe nausea/vomiting, or problems with urination or bowel movements unless otherwise stated above.  Pertinent History Reviewed:  Reviewed past medical,surgical, social, obstetrical and family history.  Reviewed problem list, medications and allergies. OB History  Gravida Para Term Preterm AB Living  '4 2 1 1 1 3  '$ SAB IAB Ectopic Multiple Live Births  '1     1 3    '$ # Outcome Date GA Lbr Len/2nd Weight Sex Delivery Anes PTL Lv  4 Current           3A Preterm 08/11/20 352w1d2:04 / 00:27 5 lb 13.3 oz (2.645 kg) M Vag-Spont EPI  LIV  3B Preterm 08/11/20 3628w1d:04 / 00:36 5 lb 6.4 oz (2.449 kg) M Vag-Spont EPI  LIV  2 Term 07/10/14 [id number] / 00:18 7 lb 11.6 oz (3.505 kg) F Vag-Spont EPI  LIV  1 SAB            Physical Assessment:   Vitals:   05/03/22 1117  BP: 126/72  Pulse: 91  Weight: 214  lb 12.8 oz (97.4 kg)  Body mass index is 29.96 kg/m.       Physical Examination:  General appearance - well appearing, and in no distress  Mental status - alert, oriented to person, place, and time  Psych:  She has a normal mood and affect  Skin - warm and dry, normal color, no suspicious lesions noted  Chest - effort normal, all lung fields clear to auscultation bilaterally  Heart - normal rate and regular rhythm  Abdomen - soft, nontender  Extremities:  No swelling or varicosities noted  Thin prep pap is not done   Chaperone: N/A    TODAY'S NT [place] 12+5 wks,DC/DA twins,normal ovaries,anterior subchronic hemorrhage 10 x 1 x 6 cm BABY A:left,posterior placenta,NB present,NT 1.3 mm,CRL 67.41 mm,FHR 154 bpm, BABY B: right,posterior fundal placenta,NB present,NT 1.3 mm,CRL 70.48 mm,FHR 160 bpm  No results found for this or any previous visit (from the past 24 hour(s)).  Assessment & Plan:  1) High-Risk Pregnancy [id number] at 33w5dwith an Estimated Date of Delivery: 11/10/22   2) Initial OB visit  3) DCDA twins> ASA  Meds:  Meds ordered this  encounter  Medications   Blood Pressure Monitor MISC    Sig: For regular home bp monitoring during pregnancy    Dispense:  1 each    Refill:  0    O09.91 Please mail to patient   aspirin EC 81 MG tablet    Sig: Take 1 tablet (81 mg total) by mouth daily. Swallow whole.    Dispense:  90 tablet    Refill:  3   Doxylamine-Pyridoxine (DICLEGIS) 10-10 MG TBEC    Sig: 2 tabs q hs, if sx persist add 1 tab q am on day 3, if sx persist add 1 tab q afternoon on day 4    Dispense:  100 tablet    Refill:  6    Initial labs obtained Continue prenatal vitamins Reviewed n/v relief measures and warning s/s to report Reviewed recommended weight gain based on pre-gravid BMI Encouraged well-balanced diet Genetic & carrier screening discussed: requests Panorama and NT/IT, Horizon neg prev preg Ultrasound discussed; fetal survey: requested CCNC completed> form faxed if has or is planning to apply for medicaid The nature of [place] MDs and other Advanced Practice Providers was explained to patient; also emphasized that fellows, residents, and students are part of our team. Does not have home bp cuff. Office bp cuff given: no. Rx sent: yes. Check bp weekly, let uKoreaknow if consistently >140/90.   Indications for ASA therapy (per uptodate) One of the following: Multifetal gestation Yes  Indications for early A1C (per uptodate) BMI >=25 (>=23 in Asian women) AND one of the following High-risk race/ethnicity (eg, African American, Latino, Native American, [place], [place] Yes  Follow-up: Return in about 4 weeks (around 05/31/2022) for HROB, 2nd IT, MD or CNM, in person; then 7wks from now twin anatomy HROB MD/CNM.   Orders Placed This Encounter  Procedures   GC/Chlamydia Probe Amp   Urine Culture   PANORAMA PRENATAL TEST FULL PANEL   CBC/D/Plt+RPR+Rh+ABO+RubIgG...   Integrated 1   Hemoglobin A1c    [place]  [facility]3/01/2023 12:01 PM

## 2022-05-03 NOTE — Progress Notes (Signed)
[place] 12+5 wks,DC/DA twins,normal ovaries,anterior subchronic hemorrhage 10 x 1 x 6 cm BABY A:left,posterior placenta,NB present,NT 1.3 mm,CRL 67.41 mm,FHR 154 bpm, BABY B: right,posterior fundal placenta,NB present,NT 1.3 mm,CRL 70.48 mm,FHR 160 bpm

## 2022-05-04 ENCOUNTER — Encounter: Payer: Self-pay | Admitting: Women's Health

## 2022-05-04 DIAGNOSIS — O0991 Supervision of high risk pregnancy, unspecified, first trimester: Secondary | ICD-10-CM | POA: Diagnosis not present

## 2022-05-05 LAB — CBC/D/PLT+RPR+RH+ABO+RUBIGG...
Antibody Screen: NEGATIVE
Basophils Absolute: 0 10*3/uL (ref 0.0–0.2)
Basos: 0 %
EOS (ABSOLUTE): 0 10*3/uL (ref 0.0–0.4)
Eos: 0 %
HCV Ab: NONREACTIVE
HIV Screen 4th Generation wRfx: NONREACTIVE
Hematocrit: 34 % (ref 34.0–46.6)
Hemoglobin: 11.8 g/dL (ref 11.1–15.9)
Hepatitis B Surface Ag: NEGATIVE
Immature Grans (Abs): 0 10*3/uL (ref 0.0–0.1)
Immature Granulocytes: 0 %
Lymphocytes Absolute: 1.7 10*3/uL (ref 0.7–3.1)
Lymphs: 16 %
MCH: 30.1 pg (ref 26.6–33.0)
MCHC: 34.7 g/dL (ref 31.5–35.7)
MCV: 87 fL (ref 79–97)
Monocytes Absolute: 0.5 10*3/uL (ref 0.1–0.9)
Monocytes: 5 %
Neutrophils Absolute: 8.6 10*3/uL — ABNORMAL HIGH (ref 1.4–7.0)
Neutrophils: 79 %
Platelets: 322 10*3/uL (ref 150–450)
RBC: 3.92 x10E6/uL (ref 3.77–5.28)
RDW: 13.4 % (ref 11.7–15.4)
RPR Ser Ql: NONREACTIVE
Rh Factor: POSITIVE
Rubella Antibodies, IGG: 12.5 index (ref >0.99)
WBC: 10.9 10*3/uL — ABNORMAL HIGH (ref 3.4–10.8)

## 2022-05-05 LAB — URINE CULTURE

## 2022-05-05 LAB — HEMOGLOBIN A1C
Est. average glucose Bld gHb Est-mCnc: 105 mg/dL
Hgb A1c MFr Bld: 5.3 % (ref 4.8–5.6)

## 2022-05-05 LAB — INTEGRATED 1
Crown Rump Length Twin B: 70.5 mm
Crown Rump Length: 67.4 mm
Gest. Age on Collection Date: 13 weeks
Maternal Age at EDD: 26.5 yr
NT Twin B: 1.3 mm
Nuchal Translucency (NT): 1.3 mm
Number of Fetuses: 2
PAPP-A Value: 2879.2 ng/mL
Weight: 215 [lb_av]

## 2022-05-05 LAB — HCV INTERPRETATION

## 2022-05-06 LAB — GC/CHLAMYDIA PROBE AMP
Chlamydia trachomatis, NAA: NEGATIVE
Neisseria Gonorrhoeae by PCR: NEGATIVE

## 2022-05-09 ENCOUNTER — Encounter: Payer: Self-pay | Admitting: Women's Health

## 2022-05-12 LAB — PANORAMA PRENATAL TEST FULL PANEL:PANORAMA TEST PLUS 5 ADDITIONAL MICRODELETIONS
FETAL FRACTION SECOND FETUS: 4.8
FETAL FRACTION: 4.9

## 2022-05-28 ENCOUNTER — Inpatient Hospital Stay (HOSPITAL_COMMUNITY)
Admission: AD | Admit: 2022-05-28 | Discharge: 2022-05-29 | Disposition: A | Discharge status: Home / Self Care | Payer: Medicaid Other | Attending: Obstetrics & Gynecology | Admitting: Obstetrics & Gynecology

## 2022-05-28 ENCOUNTER — Encounter (HOSPITAL_COMMUNITY): Payer: Self-pay | Admitting: Obstetrics & Gynecology

## 2022-05-28 DIAGNOSIS — O99512 Diseases of the respiratory system complicating pregnancy, second trimester: Secondary | ICD-10-CM | POA: Diagnosis not present

## 2022-05-28 DIAGNOSIS — O219 Vomiting of pregnancy, unspecified: Secondary | ICD-10-CM | POA: Diagnosis not present

## 2022-05-28 DIAGNOSIS — Z3A16 16 weeks gestation of pregnancy: Secondary | ICD-10-CM | POA: Insufficient documentation

## 2022-05-28 DIAGNOSIS — O26892 Other specified pregnancy related conditions, second trimester: Secondary | ICD-10-CM | POA: Insufficient documentation

## 2022-05-28 DIAGNOSIS — O30042 Twin pregnancy, dichorionic/diamniotic, second trimester: Secondary | ICD-10-CM | POA: Insufficient documentation

## 2022-05-28 DIAGNOSIS — O218 Other vomiting complicating pregnancy: Secondary | ICD-10-CM | POA: Diagnosis not present

## 2022-05-28 DIAGNOSIS — K529 Noninfective gastroenteritis and colitis, unspecified: Secondary | ICD-10-CM | POA: Insufficient documentation

## 2022-05-28 LAB — URINALYSIS, ROUTINE W REFLEX MICROSCOPIC
Bacteria, UA: NONE SEEN
Glucose, UA: NEGATIVE mg/dL
Hgb urine dipstick: NEGATIVE
Ketones, ur: NEGATIVE mg/dL
Nitrite: NEGATIVE
Protein, ur: 100 mg/dL — AB
Specific Gravity, Urine: 1.033 — ABNORMAL HIGH (ref 1.005–1.030)
Squamous Epithelial / HPF: >50 /HPF (ref 0–5)
pH: 5 (ref 5.0–8.0)

## 2022-05-28 MED ORDER — METOCLOPRAMIDE HCL 5 MG/ML IJ SOLN
10.0000 mg | Freq: Once | INTRAMUSCULAR | Status: AC
  Administered 2022-05-28: 10 mg via INTRAVENOUS
  Filled 2022-05-28: qty 2

## 2022-05-28 MED ORDER — LOPERAMIDE HCL 2 MG PO CAPS
4.0000 mg | ORAL_CAPSULE | ORAL | Status: DC | PRN
  Filled 2022-05-28: qty 2

## 2022-05-28 MED ORDER — LACTATED RINGERS IV BOLUS
1000.0000 mL | Freq: Once | INTRAVENOUS | Status: AC
  Administered 2022-05-28: 1000 mL via INTRAVENOUS

## 2022-05-28 NOTE — MAU Provider Note (Signed)
History     [id number]  Arrival date and time: 05/28/22 2224    Chief Complaint  Patient presents with   Abdominal Pain   Nausea   Diarrhea     HPI Christina Conley is a 27 y.o. at [redacted]w[redacted]d with di/di twins who presents for n/v/d. Symptoms started Thursday night. Her children are sick with same symptoms. Hadn't vomited today but has had nausea. Had multiple episodes of brown watery stool last night & today. Hasn't taken anything to nausea or diarrhea. Headache this evening. Took 2 ES tylenol at 8 pm without relief.  Denies fever, abdominal pain, dysuria, vaginal bleeding, LOF, hematochezia.    O/Positive/-- (03/12 1203)  OB History     Gravida  4   Para  2   Term  1   Preterm  1   AB  1   Living  3      SAB  1   IAB      Ectopic      Multiple  1   Live Births  3           Past Medical History:  Diagnosis Date   Acute blood loss anemia 08/12/2020   EBL with delivery PO iron, IV venofer administered postpartum 1 U pRBCs Patient asymptomatic   Asthma    child.  No meds > 2 years   Environmental allergies    Gonorrhea 08/2015   Irregular intermenstrual bleeding 05/27/2015    Past Surgical History:  Procedure Laterality Date   NO PAST SURGERIES      Family History  Problem Relation Age of Onset   Crohn's disease Mother    Other Father        problems with intestines   Other Son        absent right testicle   Hypertension Maternal Grandmother    Miscarriages / Stillbirths Maternal Grandmother    Hypertension Maternal Grandfather    Hyperlipidemia Maternal Grandfather    Crohn's disease Paternal Grandmother     Social History   Socioeconomic History   Marital status: Single    Spouse name: Not on file   Number of children: Not on file   Years of education: Not on file   Highest education level: Not on file  Occupational History   Not on file  Tobacco Use   Smoking status: Former    Packs/day: 0.25    Years: 0.50    Additional  pack years: 0.00    Total pack years: 0.13    Types: Cigarettes   Smokeless tobacco: Never  Vaping Use   Vaping Use: Never used  Substance and Sexual Activity   Alcohol use: Not Currently    Comment: occ   Drug use: Not Currently    Types: Marijuana   Sexual activity: Yes    Birth control/protection: None  Other Topics Concern   Not on file  Social History Narrative   Not on file   Social Determinants of Health   Financial Resource Strain: Low Risk  (05/03/2022)   Overall Financial Resource Strain (CARDIA)    Difficulty of Paying Living Expenses: Not hard at all  Food Insecurity: No Food Insecurity (05/03/2022)   Hunger Vital Sign    Worried About Running Out of Food in the Last Year: Never true    Ran Out of Food in the Last Year: Never true  Transportation Needs: No Transportation Needs (05/03/2022)   PRAPARE - Transportation    Lack of Transportation (  Medical): No    Lack of Transportation (Non-Medical): No  Physical Activity: Insufficiently Active (05/03/2022)   Exercise Vital Sign    Days of Exercise per Week: 2 days    Minutes of Exercise per Session: 10 min  Stress: Stress Concern Present (05/03/2022)   [employer] of Occupational Health - Occupational Stress Questionnaire    Feeling of Stress : To some extent  Social Connections: Moderately Integrated (05/03/2022)   Social Connection and Isolation Panel [NHANES]    Frequency of Communication with Friends and Family: More than three times a week    Frequency of Social Gatherings with Friends and Family: Twice a week    Attends Religious Services: More than 4 times per year    Active Member of [employer] or Organizations: Yes    Attends Banker Meetings: More than 4 times per year    Marital Status: Never married  Intimate Partner Violence: Not At Risk (05/03/2022)   Humiliation, Afraid, Rape, and Kick questionnaire    Fear of Current or Ex-Partner: No    Emotionally Abused: No    Physically Abused: No     Sexually Abused: No    Allergies  Allergen Reactions   Amoxicillin Hives   Ampicillin Hives   Penicillins Hives    Has patient had a PCN reaction causing immediate rash, facial/tongue/throat swelling, SOB or lightheadedness with hypotension: Yes Has patient had a PCN reaction causing severe rash involving mucus membranes or skin necrosis: No Has patient had a PCN reaction that required hospitalization No Has patient had a PCN reaction occurring within the last 10 years: unknown If all of the above answers are "NO", then may proceed with Cephalosporin use.     No current facility-administered medications on file prior to encounter.   Current Outpatient Medications on File Prior to Encounter  Medication Sig Dispense Refill   aspirin EC 81 MG tablet Take 1 tablet (81 mg total) by mouth daily. Swallow whole. 90 tablet 3   Blood Pressure Monitor MISC For regular home bp monitoring during pregnancy 1 each 0   Doxylamine-Pyridoxine (DICLEGIS) 10-10 MG TBEC 2 tabs q hs, if sx persist add 1 tab q am on day 3, if sx persist add 1 tab q afternoon on day 4 100 tablet 6   prenatal vitamin w/FE, FA (PRENATAL 1 + 1) 27-1 MG TABS tablet Take 1 tablet by mouth daily at 12 noon. 30 tablet 12     ROS Pertinent positives and negative per HPI, all others reviewed and negative  Physical Exam   BP 93/74 (BP Location: Left Arm)   Pulse 100   Temp 98.1 F (36.7 C) (Oral)   Resp 16   Ht 5\' 11"  (1.803 m)   Wt 94.5 kg   LMP 01/28/2022 (Exact Date)   BMI 29.07 kg/m   Patient Vitals for the past 24 hrs:  BP Temp Temp src Pulse Resp Height Weight  05/29/22 0103 93/74 98.1 F (36.7 C) Oral 100 16 -- --  05/28/22 2236 107/73 98.4 F (36.9 C) Oral (!) 125 18 5\' 11"  (1.803 m) 94.5 kg    Physical Exam Vitals and nursing note reviewed.  Constitutional:      General: She is not in acute distress.    Appearance: She is well-developed. She is not ill-appearing.  HENT:     Head: Normocephalic and  atraumatic.  Eyes:     General: No scleral icterus.       Right eye: No discharge.  Left eye: No discharge.     Conjunctiva/sclera: Conjunctivae normal.  Cardiovascular:     Rate and Rhythm: Regular rhythm. Tachycardia present.  Pulmonary:     Effort: Pulmonary effort is normal. No respiratory distress.  Abdominal:     Palpations: Abdomen is soft.     Tenderness: There is no abdominal tenderness.  Neurological:     General: No focal deficit present.     Mental Status: She is alert.  Psychiatric:        Mood and Affect: Mood normal.        Behavior: Behavior normal.        Labs Results for orders placed or performed during the hospital encounter of 05/28/22 (from the past 24 hour(s))  Urinalysis, Routine w reflex microscopic -Urine, Clean Catch     Status: Abnormal   Collection Time: 05/28/22 11:31 PM  Result Value Ref Range   Color, Urine YELLOW YELLOW   APPearance TURBID (A) CLEAR   Specific Gravity, Urine 1.033 (H) 1.005 - 1.030   pH 5.0 5.0 - 8.0   Glucose, UA NEGATIVE NEGATIVE mg/dL   Hgb urine dipstick NEGATIVE NEGATIVE   Bilirubin Urine SMALL (A) NEGATIVE   Ketones, ur NEGATIVE NEGATIVE mg/dL   Protein, ur 024 (A) NEGATIVE mg/dL   Nitrite NEGATIVE NEGATIVE   Leukocytes,Ua LARGE (A) NEGATIVE   RBC / HPF 6-10 0 - 5 RBC/hpf   WBC, UA 21-50 0 - 5 WBC/hpf   Bacteria, UA NONE SEEN NONE SEEN   Squamous Epithelial / HPF >50 0 - 5 /HPF   Mucus PRESENT     Imaging No results found.  MAU Course  Procedures Lab Orders         Urinalysis, Routine w reflex microscopic -Urine, Clean Catch    Meds ordered this encounter  Medications   lactated ringers bolus 1,000 mL   metoCLOPramide (REGLAN) injection 10 mg   loperamide (IMODIUM) capsule 4 mg   metoCLOPramide (REGLAN) 10 MG tablet    Sig: Take 1 tablet (10 mg total) by mouth every 8 (eight) hours as needed for nausea or vomiting.    Dispense:  30 tablet    Refill:  0    Order Specific Question:   Supervising  Provider    Answer:   Adam Phenix [[id number]]   Imaging Orders  No imaging studies ordered today    MDM moderate  Assessment and Plan   1. Gastroenteritis presumed infectious   2. [redacted] weeks gestation of pregnancy    -No episodes of vomiting or diarrhea in MAU. IV fluids & reglan given. Reports improvement in nausea. Declines imodium. Rx reglan prn.   #FWB: FHT x 2 present via doppler 145/152    Dispo: discharged to home in stable condition.   Discharge Instructions     Discharge patient   Complete by: As directed    Discharge disposition: 01-Home or Self Care   Discharge patient date: 05/29/2022       Judeth Horn, NP 05/29/22 1:22 AM  Allergies as of 05/29/2022       Reactions   Amoxicillin Hives   Ampicillin Hives   Penicillins Hives   Has patient had a PCN reaction causing immediate rash, facial/tongue/throat swelling, SOB or lightheadedness with hypotension: Yes Has patient had a PCN reaction causing severe rash involving mucus membranes or skin necrosis: No Has patient had a PCN reaction that required hospitalization No Has patient had a PCN reaction occurring within the last 10 years: unknown If  all of the above answers are "NO", then may proceed with Cephalosporin use.        Medication List     TAKE these medications    aspirin EC 81 MG tablet Take 1 tablet (81 mg total) by mouth daily. Swallow whole.   Blood Pressure Monitor Misc For regular home bp monitoring during pregnancy   Doxylamine-Pyridoxine 10-10 MG Tbec Commonly known as: Diclegis 2 tabs q hs, if sx persist add 1 tab q am on day 3, if sx persist add 1 tab q afternoon on day 4   metoCLOPramide 10 MG tablet Commonly known as: REGLAN Take 1 tablet (10 mg total) by mouth every 8 (eight) hours as needed for nausea or vomiting.   prenatal vitamin w/FE, FA 27-1 MG Tabs tablet Take 1 tablet by mouth daily at 12 noon.

## 2022-05-28 NOTE — MAU Note (Signed)
.  Christina Conley is a 27 y.o. at [redacted]w[redacted]d here in MAU reporting: she has had a stomach virus and has not been able to eat or drink for the past 24 hours. She reports 2 episodes of diarrhea and no vomiting today. She has constant nausea. Lower abdominal cramping 4/10. She has also had a constant HA that has not responded to Tylenol (9/10).  Vaginal Bleeding: Vaginal Bleeding Vag. Bleeding: None  Abnormal discharge/LOF: Membranes Sac Identifier: Sac 1 Membrane Status: Intact Amount: None and Amount: None  Fetal Movement: N/A  LMP: Patient's last menstrual period was 01/28/2022 (exact date). Pain score:    Vitals:   05/28/22 2236  BP: 107/73  Pulse: (!) 125  Resp: 18  Temp: 98.4 F (36.9 C)      FHT: Fetal Heart Rate Mode: External Baseline Rate (A): 145 bpm Baseline Rate (B): 152 BPM  OB Office: Faculty Lab orders placed from triage: Urinalysis

## 2022-05-29 DIAGNOSIS — K529 Noninfective gastroenteritis and colitis, unspecified: Secondary | ICD-10-CM | POA: Diagnosis not present

## 2022-05-29 DIAGNOSIS — Z3A16 16 weeks gestation of pregnancy: Secondary | ICD-10-CM

## 2022-05-29 MED ORDER — METOCLOPRAMIDE HCL 10 MG PO TABS: 10.0000 mg | ORAL_TABLET | Freq: Three times a day (TID) | ORAL | 0 refills | Status: DC | PRN

## 2022-05-29 NOTE — MAU Note (Signed)
Pt states she feels better after Reglan and liter of LR. Denies need for any additional medication or treatment. Decline Immodium-reports no diarrhea stools since the am.  No vomiting or diarrhea since arrival. Discharge instructions given.

## 2022-05-29 NOTE — Discharge Instructions (Signed)

## 2022-05-30 ENCOUNTER — Encounter: Payer: Self-pay | Admitting: Women's Health

## 2022-05-31 ENCOUNTER — Ambulatory Visit (INDEPENDENT_AMBULATORY_CARE_PROVIDER_SITE_OTHER): Payer: Medicaid Other | Admitting: Women's Health

## 2022-05-31 ENCOUNTER — Encounter: Payer: Self-pay | Admitting: Women's Health

## 2022-05-31 ENCOUNTER — Other Ambulatory Visit (HOSPITAL_COMMUNITY)
Admission: RE | Admit: 2022-05-31 | Discharge: 2022-05-31 | Disposition: A | Discharge status: Home / Self Care | Payer: Medicaid Other | Source: Ambulatory Visit | Attending: Women's Health | Admitting: Women's Health

## 2022-05-31 VITALS — BP 122/69 | HR 110 | Wt 219.0 lb

## 2022-05-31 DIAGNOSIS — Z1379 Encounter for other screening for genetic and chromosomal anomalies: Secondary | ICD-10-CM

## 2022-05-31 DIAGNOSIS — O0992 Supervision of high risk pregnancy, unspecified, second trimester: Secondary | ICD-10-CM | POA: Insufficient documentation

## 2022-05-31 DIAGNOSIS — O285 Abnormal chromosomal and genetic finding on antenatal screening of mother: Secondary | ICD-10-CM

## 2022-05-31 DIAGNOSIS — O0991 Supervision of high risk pregnancy, unspecified, first trimester: Secondary | ICD-10-CM

## 2022-05-31 DIAGNOSIS — O26892 Other specified pregnancy related conditions, second trimester: Secondary | ICD-10-CM | POA: Diagnosis not present

## 2022-05-31 DIAGNOSIS — Z3A16 16 weeks gestation of pregnancy: Secondary | ICD-10-CM

## 2022-05-31 DIAGNOSIS — Z363 Encounter for antenatal screening for malformations: Secondary | ICD-10-CM

## 2022-05-31 DIAGNOSIS — N898 Other specified noninflammatory disorders of vagina: Secondary | ICD-10-CM | POA: Diagnosis not present

## 2022-05-31 DIAGNOSIS — O30042 Twin pregnancy, dichorionic/diamniotic, second trimester: Secondary | ICD-10-CM

## 2022-05-31 MED ORDER — METRONIDAZOLE 500 MG PO TABS
500.0000 mg | ORAL_TABLET | Freq: Two times a day (BID) | ORAL | 0 refills | Status: DC
Start: 2022-05-31 — End: 2022-06-21

## 2022-05-31 NOTE — Progress Notes (Signed)
HIGH-RISK PREGNANCY VISIT Patient name: Christina Conley MRN [id number]  Date of birth: [birth year] Chief Complaint:   Routine Prenatal Visit (2ndIT)  History of Present Illness:   Christina Conley is a 27 y.o. [phone] female at [redacted]w[redacted]d with an Estimated Date of Delivery: 11/10/22 being seen today for ongoing management of a high-risk pregnancy complicated by multiple gestation DCDA twins.    Today she reports  lots of pressure, feels like babies are really low. Feels like she might have BV, some increased vaginal d/c. Went to MAU recently w/ GI virus . Contractions: Not present.  .  Movement: Absent. denies leaking of fluid.      05/03/2022   10:21 AM 03/23/2022    1:07 PM 06/23/2021    3:37 PM 03/03/2021    2:39 PM 02/03/2021   10:18 AM  Depression screen PHQ 2/9  Decreased Interest 1 0 0 0 0  Down, Depressed, Hopeless 1 0 0 1 1  PHQ - 2 Score 2 0 0 1 1  Altered sleeping 0   3 3  Tired, decreased energy 2   3 3   Change in appetite 0   3 0  Feeling bad or failure about yourself  0   0 0  Trouble concentrating 0   0 0  Moving slowly or fidgety/restless 0   0 0  Suicidal thoughts 0   0 0  PHQ-9 Score 4   10 7   Difficult doing work/chores    Somewhat difficult Somewhat difficult        05/03/2022   10:21 AM 02/03/2021   10:19 AM 02/26/2020    2:56 PM 01/14/2020    9:48 AM  GAD 7 : Generalized Anxiety Score  Nervous, Anxious, on Edge 2 1 1 2   Control/stop worrying 0 1 2 1   Worry too much - different things 0 1 2 3   Trouble relaxing 0 0 3 1  Restless 0 0 0 0  Easily annoyed or irritable 3 1 2 3   Afraid - awful might happen 0 0 0 2  Total GAD 7 Score 5 4 10 12   Anxiety Difficulty  Somewhat difficult       Review of Systems:   Pertinent items are noted in HPI Denies abnormal vaginal discharge w/ itching/odor/irritation, headaches, visual changes, shortness of breath, chest pain, abdominal pain, severe nausea/vomiting, or problems with urination or bowel movements unless otherwise  stated above. Pertinent History Reviewed:  Reviewed past medical,surgical, social, obstetrical and family history.  Reviewed problem list, medications and allergies. Physical Assessment:   Vitals:   05/31/22 1522  BP: 122/69  Pulse: (!) 110  Weight: 219 lb (99.3 kg)  Body mass index is 30.54 kg/m.           Physical Examination:   General appearance: alert, well appearing, and in no distress  Mental status: alert, oriented to person, place, and time  Skin: warm & dry   Extremities: Edema: None    Cardiovascular: normal heart rate noted  Respiratory: normal respiratory effort, no distress  Abdomen: gravid, soft, non-tender  Pelvic:  spec exam: cx visually closed & thick, large amt thin malodorous d/c, CV swab sent          Fetal Status: Fetal Heart Rate (bpm): +u/s x 2   Movement: Absent    Fetal Surveillance Testing today: informal TA u/s, +FCA and active fetus x 2   Chaperone: Peggy Dones    No results found for this or any previous  visit (from the past 24 hour(s)).  Assessment & Plan:  High-risk pregnancy: [id number] at [redacted]w[redacted]d with an Estimated Date of Delivery: 11/10/22   1) DCDA twins, stable, hasn't picked up ASA yet, to pick up and start asap  2) Vaginal d/c w/ odor, likely BV, rx metronidazole, CV swab sent for confirmation  Meds:  Meds ordered this encounter  Medications   metroNIDAZOLE (FLAGYL) 500 MG tablet    Sig: Take 1 tablet (500 mg total) by mouth 2 (two) times daily.    Dispense:  14 tablet    Refill:  0    Labs/procedures today: spec exam and CV swab, 2nd IT  Treatment Plan:   U/S q4wks    2x/wk nst or weekly bpp @ 36wks    Deliver @ 38wks:____   Reviewed: Preterm labor symptoms and general obstetric precautions including but not limited to vaginal bleeding, contractions, leaking of fluid and fetal movement were reviewed in detail with the patient.  All questions were answered. Does have home bp cuff. Office bp cuff given: not applicable. Check bp  weekly, let us know if consistently >140 and/or >90.  Follow-up: Return for As scheduled.   Future Appointments  Date Time Provider Department Center  06/21/2022  8:30 AM [facility] [place] [facility] None  06/21/2022 10:10 AM Cheral Marker, CNM [facility]  03/29/2023  1:00 PM Anabel Halon, MD [facility]    Orders Placed This Encounter  Procedures   US OB Comp + 14 Wk   US OB Comp AddL Gest + [address]   INTEGRATED 2   Cheral Marker [place], [facility] 05/31/2022 4:06 PM

## 2022-05-31 NOTE — Patient Instructions (Signed)
Christina Conley, thank you for choosing our office today! We appreciate the opportunity to meet your healthcare needs. You may receive a short survey by mail, e-mail, or through [employer]. If you are happy with your care we would appreciate if you could take just a few minutes to complete the survey questions. We read all of your comments and take your feedback very seriously. Thank you again for choosing our office.  Center for [employer] Team at [facility] at [facility] ([address] [place], [place] [address]) Entrance C, located off of [address] Free 24/7 valet parking  Go to [email or web address] to register for FREE online childbirth classes  Call the office [phone]) or go to [facility] if: You begin to severe cramping Your water breaks.  Sometimes it is a big gush of fluid, sometimes it is just a trickle that keeps getting your panties wet or running down your legs You have vaginal bleeding.  It is normal to have a small amount of spotting if your cervix was checked.   [facility] Pediatricians/Family Doctors [facility]): [address] Colette Ribas, [phone]           [facility]: [address], [phone]                [facility]): [address], [phone] (call to ask if accepting patients) [facility] Department: [address], [place], [phone]    [facility] Pediatricians/Family Doctors [facility]): [phone] [address], [phone] [facility]: [address] [place], [phone] [facility]: [address], [phone]  [facility] Doctors  [facility]): [phone] [facility]: [address], [phone]   [facility] Doctors [facility]: [address], [phone]  [facility] Doctors  [facility]: [phone], [phone]  Home Blood Pressure Monitoring for Patients   Your provider has recommended that you check your blood pressure (BP) at least once a week at home. If you do not have a blood pressure cuff at home, one will be provided for you. Contact your provider if you have not received your monitor within 1 week.   Helpful Tips for Accurate Home Blood Pressure Checks  Don't smoke, exercise, or drink caffeine 30 minutes before checking your BP Use the restroom before checking your BP (a full bladder can raise your pressure) Relax in a comfortable upright chair Feet on the ground Left arm resting comfortably on a flat surface at the level of your heart Legs uncrossed Back supported Sit quietly and don't talk Place the cuff on your bare arm Adjust snuggly, so that only two fingertips can fit between your skin and the top of the cuff Check 2 readings separated by at least one minute Keep a log of your BP readings For a visual, please reference this diagram: http://ccnc.care/bpdiagram  Provider Name: [facility]     Phone: [phone]  Zone 1: ALL CLEAR  Continue to monitor your symptoms:  BP reading is less than 140 (top number) or less than 90 (bottom number)  No right upper stomach pain No headaches or seeing spots No feeling nauseated or throwing up No swelling in face and hands  Zone 2: CAUTION Call your doctor's office for any of the following:  BP reading is greater than 140 (top number) or greater than  90 (bottom number)  Stomach pain under your ribs in the middle or right side Headaches or seeing spots Feeling nauseated or throwing up Swelling in face and hands  Zone 3: EMERGENCY  Seek immediate medical care if you have any of the following:  BP reading is greater than160 (top number) or greater than 110 (bottom number) Severe headaches not improving with Tylenol Serious difficulty catching your breath Any worsening symptoms from  Zone 2     Second Trimester of Pregnancy The second trimester is from week 14 through week 27 (months 4 through 6). The second trimester is often a time when you feel your best. Your body has adjusted to being pregnant, and you begin to feel better physically. Usually, morning sickness has lessened or quit completely, you may have more energy, and you may have an increase in appetite. The second trimester is also a time when the fetus is growing rapidly. At the end of the sixth month, the fetus is about 9 inches long and weighs about 1 pounds. You will likely begin to feel the baby move (quickening) between 16 and 20 weeks of pregnancy. Body changes during your second trimester Your body continues to go through many changes during your second trimester. The changes vary from woman to woman. Your weight will continue to increase. You will notice your lower abdomen bulging out. You may begin to get stretch marks on your hips, abdomen, and breasts. You may develop headaches that can be relieved by medicines. The medicines should be approved by your health care provider. You may urinate more often because the fetus is pressing on your bladder. You may develop or continue to have heartburn as a result of your pregnancy. You may develop constipation because certain hormones are causing the muscles that push waste through your intestines to slow down. You may develop hemorrhoids or swollen, bulging veins (varicose veins). You may have back pain. This is caused by: Weight gain. Pregnancy hormones that are relaxing the joints in your pelvis. A shift in weight and the muscles that support your balance. Your breasts will continue to grow and they will continue to become tender. Your gums may bleed and may be sensitive to brushing and flossing. Dark spots or blotches (chloasma, mask of pregnancy) may develop on your face. This will likely fade after the baby is born. A dark line from your belly button to  the pubic area (linea nigra) may appear. This will likely fade after the baby is born. You may have changes in your hair. These can include thickening of your hair, rapid growth, and changes in texture. Some women also have hair loss during or after pregnancy, or hair that feels dry or thin. Your hair will most likely return to normal after your baby is born.  What to expect at prenatal visits During a routine prenatal visit: You will be weighed to make sure you and the fetus are growing normally. Your blood pressure will be taken. Your abdomen will be measured to track your baby's growth. The fetal heartbeat will be listened to. Any test results from the previous visit will be discussed.  Your health care provider may ask you: How you are feeling. If you are feeling the baby move. If you have had any abnormal symptoms, such as leaking fluid, bleeding, severe headaches, or abdominal cramping. If you are using any tobacco products, including cigarettes, chewing tobacco, and electronic cigarettes. If you have any questions.  Other tests that may be performed during   your second trimester include: Blood tests that check for: Low iron levels (anemia). High blood sugar that affects pregnant women (gestational diabetes) between 24 and 28 weeks. Rh antibodies. This is to check for a protein on red blood cells (Rh factor). Urine tests to check for infections, diabetes, or protein in the urine. An ultrasound to confirm the proper growth and development of the baby. An amniocentesis to check for possible genetic problems. Fetal screens for spina bifida and Down syndrome. HIV (human immunodeficiency virus) testing. Routine prenatal testing includes screening for HIV, unless you choose not to have this test.  Follow these instructions at home: Medicines Follow your health care provider's instructions regarding medicine use. Specific medicines may be either safe or unsafe to take during  pregnancy. Take a prenatal vitamin that contains at least 600 micrograms (mcg) of folic acid. If you develop constipation, try taking a stool softener if your health care provider approves. Eating and drinking Eat a balanced diet that includes fresh fruits and vegetables, whole grains, good sources of protein such as meat, eggs, or tofu, and low-fat dairy. Your health care provider will help you determine the amount of weight gain that is right for you. Avoid raw meat and uncooked cheese. These carry germs that can cause birth defects in the baby. If you have low calcium intake from food, talk to your health care provider about whether you should take a daily calcium supplement. Limit foods that are high in fat and processed sugars, such as fried and sweet foods. To prevent constipation: Drink enough fluid to keep your urine clear or pale yellow. Eat foods that are high in fiber, such as fresh fruits and vegetables, whole grains, and beans. Activity Exercise only as directed by your health care provider. Most women can continue their usual exercise routine during pregnancy. Try to exercise for 30 minutes at least 5 days a week. Stop exercising if you experience uterine contractions. Avoid heavy lifting, wear low heel shoes, and practice good posture. A sexual relationship may be continued unless your health care provider directs you otherwise. Relieving pain and discomfort Wear a good support bra to prevent discomfort from breast tenderness. Take warm sitz baths to soothe any pain or discomfort caused by hemorrhoids. Use hemorrhoid cream if your health care provider approves. Rest with your legs elevated if you have leg cramps or low back pain. If you develop varicose veins, wear support hose. Elevate your feet for 15 minutes, 3-4 times a day. Limit salt in your diet. Prenatal Care Write down your questions. Take them to your prenatal visits. Keep all your prenatal visits as told by your health  care provider. This is important. Safety Wear your seat belt at all times when driving. Make a list of emergency phone numbers, including numbers for family, friends, the hospital, and police and fire departments. General instructions Ask your health care provider for a referral to a local prenatal education class. Begin classes no later than the beginning of month 6 of your pregnancy. Ask for help if you have counseling or nutritional needs during pregnancy. Your health care provider can offer advice or refer you to specialists for help with various needs. Do not use hot tubs, steam rooms, or saunas. Do not douche or use tampons or scented sanitary pads. Do not cross your legs for long periods of time. Avoid cat litter boxes and soil used by cats. These carry germs that can cause birth defects in the baby and possibly loss of the   fetus by miscarriage or stillbirth. Avoid all smoking, herbs, alcohol, and unprescribed drugs. Chemicals in these products can affect the formation and growth of the baby. Do not use any products that contain nicotine or tobacco, such as cigarettes and e-cigarettes. If you need help quitting, ask your health care provider. Visit your dentist if you have not gone yet during your pregnancy. Use a soft toothbrush to brush your teeth and be gentle when you floss. Contact a health care provider if: You have dizziness. You have mild pelvic cramps, pelvic pressure, or nagging pain in the abdominal area. You have persistent nausea, vomiting, or diarrhea. You have a bad smelling vaginal discharge. You have pain when you urinate. Get help right away if: You have a fever. You are leaking fluid from your vagina. You have spotting or bleeding from your vagina. You have severe abdominal cramping or pain. You have rapid weight gain or weight loss. You have shortness of breath with chest pain. You notice sudden or extreme swelling of your face, hands, ankles, feet, or legs. You  have not felt your baby move in over an hour. You have severe headaches that do not go away when you take medicine. You have vision changes. Summary The second trimester is from week 14 through week 27 (months 4 through 6). It is also a time when the fetus is growing rapidly. Your body goes through many changes during pregnancy. The changes vary from woman to woman. Avoid all smoking, herbs, alcohol, and unprescribed drugs. These chemicals affect the formation and growth your baby. Do not use any tobacco products, such as cigarettes, chewing tobacco, and e-cigarettes. If you need help quitting, ask your health care provider. Contact your health care provider if you have any questions. Keep all prenatal visits as told by your health care provider. This is important. This information is not intended to replace advice given to you by your health care provider. Make sure you discuss any questions you have with your health care provider. Document Released: 02/01/2001 Document Revised: 07/16/2015 Document Reviewed: 04/10/2012 Elsevier Interactive Patient Education  2017 Elsevier Inc.  

## 2022-06-02 ENCOUNTER — Telehealth: Payer: Self-pay | Admitting: Women's Health

## 2022-06-02 ENCOUNTER — Encounter: Payer: Self-pay | Admitting: Women's Health

## 2022-06-02 LAB — INTEGRATED 2
AFP MoM: 6
Alpha-Fetoprotein: 181.2 ng/mL
Crown Rump Length Twin B: 70.5 mm
Crown Rump Length: 67.4 mm
DIA MoM: 5.01
DIA Value: 618.6 pg/mL
Estriol, Unconjugated: 2.38 ng/mL
Gest. Age on Collection Date: 13 weeks
Gestational Age: 17 weeks
Maternal Age at EDD: 26.5 yr
NT MoM Twin B: 0.81
NT Twin B: 1.3 mm
Nuchal Translucency (NT): 1.3 mm
Nuchal Translucency MoM: 0.86
Number of Fetuses: 2
PAPP-A MoM: 3.64
PAPP-A Value: 2879.2 ng/mL
Test Results:: POSITIVE — AB
Weight: 215 [lb_av]
Weight: 215 [lb_av]
hCG MoM: 5.27
hCG Value: 124.8 IU/mL
uE3 MoM: 2.24

## 2022-06-02 LAB — CERVICOVAGINAL ANCILLARY ONLY
Bacterial Vaginitis (gardnerella): POSITIVE — AB
Candida Glabrata: NEGATIVE
Candida Vaginitis: NEGATIVE
Chlamydia: NEGATIVE
Comment: NEGATIVE
Comment: NEGATIVE
Comment: NEGATIVE
Comment: NEGATIVE
Comment: NEGATIVE
Comment: NORMAL
Neisseria Gonorrhea: NEGATIVE
Trichomonas: NEGATIVE

## 2022-06-02 NOTE — Telephone Encounter (Signed)
Patient calling stating that she got her results but doesn't understand them and is wanting someone to call her.

## 2022-06-06 DIAGNOSIS — O285 Abnormal chromosomal and genetic finding on antenatal screening of mother: Secondary | ICD-10-CM | POA: Insufficient documentation

## 2022-06-06 NOTE — Addendum Note (Signed)
Addended by: Cheral Marker on: 06/06/2022 09:28 AM   Modules accepted: Orders

## 2022-06-21 ENCOUNTER — Encounter: Payer: Self-pay | Admitting: Women's Health

## 2022-06-21 ENCOUNTER — Other Ambulatory Visit: Payer: Self-pay | Admitting: Obstetrics & Gynecology

## 2022-06-21 ENCOUNTER — Ambulatory Visit (INDEPENDENT_AMBULATORY_CARE_PROVIDER_SITE_OTHER): Payer: Medicaid Other

## 2022-06-21 ENCOUNTER — Ambulatory Visit (INDEPENDENT_AMBULATORY_CARE_PROVIDER_SITE_OTHER): Payer: Medicaid Other | Admitting: Women's Health

## 2022-06-21 VITALS — BP 113/74 | HR 83 | Wt 222.0 lb

## 2022-06-21 DIAGNOSIS — Z3A19 19 weeks gestation of pregnancy: Secondary | ICD-10-CM | POA: Diagnosis not present

## 2022-06-21 DIAGNOSIS — O30042 Twin pregnancy, dichorionic/diamniotic, second trimester: Secondary | ICD-10-CM

## 2022-06-21 DIAGNOSIS — O0991 Supervision of high risk pregnancy, unspecified, first trimester: Secondary | ICD-10-CM

## 2022-06-21 DIAGNOSIS — Z363 Encounter for antenatal screening for malformations: Secondary | ICD-10-CM | POA: Diagnosis not present

## 2022-06-21 DIAGNOSIS — O0992 Supervision of high risk pregnancy, unspecified, second trimester: Secondary | ICD-10-CM

## 2022-06-21 DIAGNOSIS — O285 Abnormal chromosomal and genetic finding on antenatal screening of mother: Secondary | ICD-10-CM

## 2022-06-21 NOTE — Progress Notes (Signed)
HIGH-RISK PREGNANCY VISIT Patient name: Christina Conley MRN [id number]  Date of birth: 12-01-1995 Chief Complaint:   Routine Prenatal Visit (Anatomy scan)  History of Present Illness:   Christina Conley is a 27 y.o. [phone] female at [redacted]w[redacted]d with an Estimated Date of Delivery: 11/10/22 being seen today for ongoing management of a high-risk pregnancy complicated by multiple gestation DCDA, increased r/f OSB/elevated MSAFP.    Today she reports no complaints. Didn't see MFM/GC, still thinking if she wants to do this. Contractions: Not present. Vag. Bleeding: None.  Movement: Present. denies leaking of fluid.      05/03/2022   10:21 AM 03/23/2022    1:07 PM 06/23/2021    3:37 PM 03/03/2021    2:39 PM 02/03/2021   10:18 AM  Depression screen PHQ 2/9  Decreased Interest 1 0 0 0 0  Down, Depressed, Hopeless 1 0 0 1 1  PHQ - 2 Score 2 0 0 1 1  Altered sleeping 0   3 3  Tired, decreased energy 2   3 3   Change in appetite 0   3 0  Feeling bad or failure about yourself  0   0 0  Trouble concentrating 0   0 0  Moving slowly or fidgety/restless 0   0 0  Suicidal thoughts 0   0 0  PHQ-9 Score 4   10 7   Difficult doing work/chores    Somewhat difficult Somewhat difficult        05/03/2022   10:21 AM 02/03/2021   10:19 AM 02/26/2020    2:56 PM 01/14/2020    9:48 AM  GAD 7 : Generalized Anxiety Score  Nervous, Anxious, on Edge 2 1 1 2   Control/stop worrying 0 1 2 1   Worry too much - different things 0 1 2 3   Trouble relaxing 0 0 3 1  Restless 0 0 0 0  Easily annoyed or irritable 3 1 2 3   Afraid - awful might happen 0 0 0 2  Total GAD 7 Score 5 4 10 12   Anxiety Difficulty  Somewhat difficult       Review of Systems:   Pertinent items are noted in HPI Denies abnormal vaginal discharge w/ itching/odor/irritation, headaches, visual changes, shortness of breath, chest pain, abdominal pain, severe nausea/vomiting, or problems with urination or bowel movements unless otherwise stated  above. Pertinent History Reviewed:  Reviewed past medical,surgical, social, obstetrical and family history.  Reviewed problem list, medications and allergies. Physical Assessment:   Vitals:   06/21/22 0956  BP: 113/74  Pulse: 83  Weight: 222 lb (100.7 kg)  Body mass index is 30.96 kg/m.           Physical Examination:   General appearance: alert, well appearing, and in no distress  Mental status: alert, oriented to person, place, and time  Skin: warm & dry   Extremities: Edema: None    Cardiovascular: normal heart rate noted  Respiratory: normal respiratory effort, no distress  Abdomen: gravid, soft, non-tender  Pelvic: Cervical exam deferred         Fetal Surveillance Testing today: [place] 19+5 wks,DC/DA twins,CX 5 cm,normal ovaries BABY A,female, left,cephalic,posterior placenta gr 0,SVP of fluid 4.2 cm,FHR 154 bpm,EFW 327 g 62 %,anatomy complete,no obvious abnormalities,discordance 13% BABY B,female,superior right,breech,posterior fundal placenta,marginal cord vs velamentous cord insertion,SVP of fluid 4.5 cm,FHR 148 bpm,EFW 284 g 23%,anatomy complete  Chaperone: N/A    No results found for this or any previous visit (from the past 24  hour(s)).  Assessment & Plan:  High-risk pregnancy: [id number] at 110w5d with an Estimated Date of Delivery: 11/10/22   1) DCDA twins, stable, discordance 13%  2) Baby B w/ marginal/velamentous cord insertion  3) Increased r/f OSB/elevated MSAFP> had ordered MFM/GC referral 4/15, hasn't seen them. Offered referral again today, will think about it and let us know.   Meds: No orders of the defined types were placed in this encounter.  Labs/procedures today: U/S  Treatment Plan:  U/S 24, 28, 32, 36wks    2x/wk nst or weekly bpp @ 36wks    Deliver @ 38wks:____   Reviewed: Preterm labor symptoms and general obstetric precautions including but not limited to vaginal bleeding, contractions, leaking of fluid and fetal movement were reviewed in detail  with the patient.  All questions were answered. Does have home bp cuff. Office bp cuff given: not applicable. Check bp weekly, let us know if consistently >140 and/or >90.  Follow-up: Return in about 4 weeks (around 07/19/2022) for HROB, Twins, US:EFW, MD.   Future Appointments  Date Time Provider Department Center  03/29/2023  1:00 PM Anabel Halon, MD [facility]    Orders Placed This Encounter  Procedures   US OB Follow Up   US OB Follow Up AddL Alexander Bergeron CNM, [facility] 06/21/2022 10:31 AM

## 2022-06-21 NOTE — Progress Notes (Signed)
[place] 19+5 wks,DC/DA twins,CX 5 cm,normal ovaries BABY A,female, left,cephalic,posterior placenta gr 0,SVP of fluid 4.2 cm,FHR 154 bpm,EFW 327 g 62 %,anatomy complete,no obvious abnormalities,discordance 13% BABY B,female,superior right,breech,posterior fundal placenta,marginal cord vs velamentous cord insertion,SVP of fluid 4.5 cm,FHR 148 bpm,EFW 284 g 23%,anatomy complete

## 2022-06-21 NOTE — Patient Instructions (Addendum)
Christina Conley, thank you for choosing our office today! We appreciate the opportunity to meet your healthcare needs. You may receive a short survey by mail, e-mail, or through [employer]. If you are happy with your care we would appreciate if you could take just a few minutes to complete the survey questions. We read all of your comments and take your feedback very seriously. Thank you again for choosing our office.  Center for [employer] Team at [facility] at [facility] ([address] [place], [place] [address]) Entrance C, located off of [address] Free 24/7 valet parking  Go to [email or web address] to register for FREE online childbirth classes  Call the office [phone]) or go to [facility] if: You begin to severe cramping Your water breaks.  Sometimes it is a big gush of fluid, sometimes it is just a trickle that keeps getting your panties wet or running down your legs You have vaginal bleeding.  It is normal to have a small amount of spotting if your cervix was checked.   [facility] Pediatricians/Family Doctors [facility]): [address] Colette Ribas, [phone]           [facility]: [address], [phone]                [facility]): [address], [phone] (call to ask if accepting patients) [facility] Department: [address], [place], [phone]    [facility] Pediatricians/Family Doctors [facility]): [phone] [address], [phone] [facility]: [address] [place], [phone] [facility]: [address], [phone]  [facility] Doctors  [facility]): [phone] [facility]: [address], [phone]   [facility] Doctors [facility]: [address], [phone]  [facility] Doctors  [facility]: [phone], [phone]  Home Blood Pressure Monitoring for Patients   Your provider has recommended that you check your blood pressure (BP) at least once a week at home. If you do not have a blood pressure cuff at home, one will be provided for you. Contact your provider if you have not received your monitor within 1 week.   Helpful Tips for Accurate Home Blood Pressure Checks  Don't smoke, exercise, or drink caffeine 30 minutes before checking your BP Use the restroom before checking your BP (a full bladder can raise your pressure) Relax in a comfortable upright chair Feet on the ground Left arm resting comfortably on a flat surface at the level of your heart Legs uncrossed Back supported Sit quietly and don't talk Place the cuff on your bare arm Adjust snuggly, so that only two fingertips can fit between your skin and the top of the cuff Check 2 readings separated by at least one minute Keep a log of your BP readings For a visual, please reference this diagram: http://ccnc.care/bpdiagram  Provider Name: [facility]     Phone: [phone]  Zone 1: ALL CLEAR  Continue to monitor your symptoms:  BP reading is less than 140 (top number) or less than 90 (bottom number)  No right upper stomach pain No headaches or seeing spots No feeling nauseated or throwing up No swelling in face and hands  Zone 2: CAUTION Call your doctor's office for any of the following:  BP reading is greater than 140 (top number) or greater than  90 (bottom number)  Stomach pain under your ribs in the middle or right side Headaches or seeing spots Feeling nauseated or throwing up Swelling in face and hands  Zone 3: EMERGENCY  Seek immediate medical care if you have any of the following:  BP reading is greater than160 (top number) or greater than 110 (bottom number) Severe headaches not improving with Tylenol Serious difficulty catching your breath Any worsening symptoms from  Zone 2     Second Trimester of Pregnancy The second trimester is from week 14 through week 27 (months 4 through 6). The second trimester is often a time when you feel your best. Your body has adjusted to being pregnant, and you begin to feel better physically. Usually, morning sickness has lessened or quit completely, you may have more energy, and you may have an increase in appetite. The second trimester is also a time when the fetus is growing rapidly. At the end of the sixth month, the fetus is about 9 inches long and weighs about 1 pounds. You will likely begin to feel the baby move (quickening) between 16 and 20 weeks of pregnancy. Body changes during your second trimester Your body continues to go through many changes during your second trimester. The changes vary from woman to woman. Your weight will continue to increase. You will notice your lower abdomen bulging out. You may begin to get stretch marks on your hips, abdomen, and breasts. You may develop headaches that can be relieved by medicines. The medicines should be approved by your health care provider. You may urinate more often because the fetus is pressing on your bladder. You may develop or continue to have heartburn as a result of your pregnancy. You may develop constipation because certain hormones are causing the muscles that push waste through your intestines to slow down. You may develop hemorrhoids or swollen, bulging veins (varicose veins). You may have back pain. This is caused by: Weight gain. Pregnancy hormones that are relaxing the joints in your pelvis. A shift in weight and the muscles that support your balance. Your breasts will continue to grow and they will continue to become tender. Your gums may bleed and may be sensitive to brushing and flossing. Dark spots or blotches (chloasma, mask of pregnancy) may develop on your face. This will likely fade after the baby is born. A dark line from your belly button to  the pubic area (linea nigra) may appear. This will likely fade after the baby is born. You may have changes in your hair. These can include thickening of your hair, rapid growth, and changes in texture. Some women also have hair loss during or after pregnancy, or hair that feels dry or thin. Your hair will most likely return to normal after your baby is born.  What to expect at prenatal visits During a routine prenatal visit: You will be weighed to make sure you and the fetus are growing normally. Your blood pressure will be taken. Your abdomen will be measured to track your baby's growth. The fetal heartbeat will be listened to. Any test results from the previous visit will be discussed.  Your health care provider may ask you: How you are feeling. If you are feeling the baby move. If you have had any abnormal symptoms, such as leaking fluid, bleeding, severe headaches, or abdominal cramping. If you are using any tobacco products, including cigarettes, chewing tobacco, and electronic cigarettes. If you have any questions.  Other tests that may be performed during   your second trimester include: Blood tests that check for: Low iron levels (anemia). High blood sugar that affects pregnant women (gestational diabetes) between 24 and 28 weeks. Rh antibodies. This is to check for a protein on red blood cells (Rh factor). Urine tests to check for infections, diabetes, or protein in the urine. An ultrasound to confirm the proper growth and development of the baby. An amniocentesis to check for possible genetic problems. Fetal screens for spina bifida and Down syndrome. HIV (human immunodeficiency virus) testing. Routine prenatal testing includes screening for HIV, unless you choose not to have this test.  Follow these instructions at home: Medicines Follow your health care provider's instructions regarding medicine use. Specific medicines may be either safe or unsafe to take during  pregnancy. Take a prenatal vitamin that contains at least 600 micrograms (mcg) of folic acid. If you develop constipation, try taking a stool softener if your health care provider approves. Eating and drinking Eat a balanced diet that includes fresh fruits and vegetables, whole grains, good sources of protein such as meat, eggs, or tofu, and low-fat dairy. Your health care provider will help you determine the amount of weight gain that is right for you. Avoid raw meat and uncooked cheese. These carry germs that can cause birth defects in the baby. If you have low calcium intake from food, talk to your health care provider about whether you should take a daily calcium supplement. Limit foods that are high in fat and processed sugars, such as fried and sweet foods. To prevent constipation: Drink enough fluid to keep your urine clear or pale yellow. Eat foods that are high in fiber, such as fresh fruits and vegetables, whole grains, and beans. Activity Exercise only as directed by your health care provider. Most women can continue their usual exercise routine during pregnancy. Try to exercise for 30 minutes at least 5 days a week. Stop exercising if you experience uterine contractions. Avoid heavy lifting, wear low heel shoes, and practice good posture. A sexual relationship may be continued unless your health care provider directs you otherwise. Relieving pain and discomfort Wear a good support bra to prevent discomfort from breast tenderness. Take warm sitz baths to soothe any pain or discomfort caused by hemorrhoids. Use hemorrhoid cream if your health care provider approves. Rest with your legs elevated if you have leg cramps or low back pain. If you develop varicose veins, wear support hose. Elevate your feet for 15 minutes, 3-4 times a day. Limit salt in your diet. Prenatal Care Write down your questions. Take them to your prenatal visits. Keep all your prenatal visits as told by your health  care provider. This is important. Safety Wear your seat belt at all times when driving. Make a list of emergency phone numbers, including numbers for family, friends, the hospital, and police and fire departments. General instructions Ask your health care provider for a referral to a local prenatal education class. Begin classes no later than the beginning of month 6 of your pregnancy. Ask for help if you have counseling or nutritional needs during pregnancy. Your health care provider can offer advice or refer you to specialists for help with various needs. Do not use hot tubs, steam rooms, or saunas. Do not douche or use tampons or scented sanitary pads. Do not cross your legs for long periods of time. Avoid cat litter boxes and soil used by cats. These carry germs that can cause birth defects in the baby and possibly loss of the   fetus by miscarriage or stillbirth. Avoid all smoking, herbs, alcohol, and unprescribed drugs. Chemicals in these products can affect the formation and growth of the baby. Do not use any products that contain nicotine or tobacco, such as cigarettes and e-cigarettes. If you need help quitting, ask your health care provider. Visit your dentist if you have not gone yet during your pregnancy. Use a soft toothbrush to brush your teeth and be gentle when you floss. Contact a health care provider if: You have dizziness. You have mild pelvic cramps, pelvic pressure, or nagging pain in the abdominal area. You have persistent nausea, vomiting, or diarrhea. You have a bad smelling vaginal discharge. You have pain when you urinate. Get help right away if: You have a fever. You are leaking fluid from your vagina. You have spotting or bleeding from your vagina. You have severe abdominal cramping or pain. You have rapid weight gain or weight loss. You have shortness of breath with chest pain. You notice sudden or extreme swelling of your face, hands, ankles, feet, or legs. You  have not felt your baby move in over an hour. You have severe headaches that do not go away when you take medicine. You have vision changes. Summary The second trimester is from week 14 through week 27 (months 4 through 6). It is also a time when the fetus is growing rapidly. Your body goes through many changes during pregnancy. The changes vary from woman to woman. Avoid all smoking, herbs, alcohol, and unprescribed drugs. These chemicals affect the formation and growth your baby. Do not use any tobacco products, such as cigarettes, chewing tobacco, and e-cigarettes. If you need help quitting, ask your health care provider. Contact your health care provider if you have any questions. Keep all prenatal visits as told by your health care provider. This is important. This information is not intended to replace advice given to you by your health care provider. Make sure you discuss any questions you have with your health care provider. Document Released: 02/01/2001 Document Revised: 07/16/2015 Document Reviewed: 04/10/2012 Elsevier Interactive Patient Education  2017 [place] [facility]AMILY PRACTICE PH[facility]ICIANS  [facility] [address] [place], [place] [address] Phone - [phone]   Fax - [place] [address] [place], [place]  [address] Phone - [phone]   Fax - [phone]  [place] [address], [place] [address] [place], [place]  [address] Phone - [phone]   Fax - [phone]  [facility] [address] [place], [place]  [address] Phone - [phone]   Fax - [phone]  [place] [address] [place], [place]  [address] Phone - [phone]   Fax - [place] [address] [place], [place]  [address] Phone - [phone]   Fax - [place] [facility] [place] [address], [place], [place]  [address] Phone - [phone]   Fax - [phone]  [place] [address] [place], [place], [place]  [address] Phone - [phone]   Fax - [place] [address] [place], [place]  [address] Phone - [phone]   Fax - [phone] [facility] [place]. [address] [place],   [address] Phone - [phone]   Fax - [phone]  [place] [address] [place], [place]  [place] Phone - [phone]   Fax - [phone]  [facility] [place], [place], [place]  [address] Phone - [phone]   Fax - [place] [address], [place], [place]  [address] Phone - [phone]   Fax - [phone]  [facility] [address] [place], [place]  [address] Phone - [place], [place]  [address] Phone - [phone]   Fax - [place] [address], [place], [place]  [address] Phone - [phone]   Fax - [phone]  [place] [address] [place], [place]  [address] Phone - [phone]   Fax - [place]. [place], [place]  [address] Phone - [phone]   Fax - [place], [place], [place]  [address] Phone - [phone]   Fax - [place] [address], [place], [place]  [address] Phone - [phone]   Fax - [phone]  [place] [address] [place], [place]  [address] Phone - [phone]   Fax - [place], [place], [place]  [address] Phone - [phone]   Fax - [phone]  [place] [address] [place], [place]  [address] Phone - [phone]   Fax - [phone] [place] [address] [place], [place]  [address] Phone - [phone]   Fax - [place] [address] [place], [place]  [address] Phone - [phone]   Fax - [place] [address], [place], [place]  [address] Phone - [phone]   Fax - [phone]

## 2022-06-27 ENCOUNTER — Encounter: Payer: Self-pay | Admitting: Women's Health

## 2022-06-28 ENCOUNTER — Ambulatory Visit (INDEPENDENT_AMBULATORY_CARE_PROVIDER_SITE_OTHER): Payer: Medicaid Other | Admitting: *Deleted

## 2022-06-28 VITALS — BP 101/70 | HR 108

## 2022-06-28 DIAGNOSIS — L299 Pruritus, unspecified: Secondary | ICD-10-CM | POA: Diagnosis not present

## 2022-06-28 DIAGNOSIS — Z3A20 20 weeks gestation of pregnancy: Secondary | ICD-10-CM

## 2022-06-28 DIAGNOSIS — O99712 Diseases of the skin and subcutaneous tissue complicating pregnancy, second trimester: Secondary | ICD-10-CM | POA: Diagnosis not present

## 2022-06-28 NOTE — Progress Notes (Signed)
   NURSE VISIT- BLOOD PRESSURE CHECK  SUBJECTIVE:  Christina Conley is a 27 y.o. [phone] female here for BP check. She is [redacted]w[redacted]d pregnant    HYPERTENSION ROS:  Pregnant/postpartum:  Severe headaches that don't go away with tylenol/other medicines: No  Visual changes (seeing spots/double/blurred vision) No  Severe pain under right breast breast or in center of upper chest No  Severe nausea/vomiting No  Taking medicines as instructed not applicable    OBJECTIVE:  BP 101/70   Pulse (!) 108   LMP 01/28/2022 (Exact Date)   Appearance alert, well appearing, and in no distress.  ASSESSMENT: Pregnancy [redacted]w[redacted]d  blood pressure check  PLAN: Discussed with [name], CNM, [facility]   Recommendations:  checking labs for cholestasis    Follow-up: as scheduled   Annamarie Dawley  06/28/2022 9:24 AM

## 2022-06-29 LAB — COMPREHENSIVE METABOLIC PANEL
ALT: 11 IU/L (ref 0–32)
BUN/Creatinine Ratio: 11 (ref 9–23)
CO2: 18 mmol/L — ABNORMAL LOW (ref 20–29)

## 2022-06-30 LAB — COMPREHENSIVE METABOLIC PANEL
AST: 15 IU/L (ref 0–40)
Albumin/Globulin Ratio: 1.3 (ref 1.2–2.2)
Albumin: 3.7 g/dL — ABNORMAL LOW (ref 4.0–5.0)
Alkaline Phosphatase: 87 IU/L (ref 44–121)
BUN: 8 mg/dL (ref 6–20)
Bilirubin Total: 0.4 mg/dL (ref 0.0–1.2)
Calcium: 9 mg/dL (ref 8.7–10.2)
Chloride: 103 mmol/L (ref 96–106)
Creatinine, Ser: 0.75 mg/dL (ref 0.57–1.00)
Globulin, Total: 2.9 g/dL (ref 1.5–4.5)
Glucose: 76 mg/dL (ref 70–99)
Potassium: 4.2 mmol/L (ref 3.5–5.2)
Sodium: 136 mmol/L (ref 134–144)
Total Protein: 6.6 g/dL (ref 6.0–8.5)
eGFR: 113 mL/min/{1.73_m2} (ref >59)

## 2022-06-30 LAB — BILE ACIDS, TOTAL: Bile Acids Total: 6.3 umol/L (ref 0.0–10.0)

## 2022-07-19 ENCOUNTER — Ambulatory Visit (INDEPENDENT_AMBULATORY_CARE_PROVIDER_SITE_OTHER): Payer: Medicaid Other | Admitting: Obstetrics & Gynecology

## 2022-07-19 ENCOUNTER — Ambulatory Visit (INDEPENDENT_AMBULATORY_CARE_PROVIDER_SITE_OTHER): Payer: Medicaid Other

## 2022-07-19 VITALS — BP 106/74 | HR 92 | Wt 230.0 lb

## 2022-07-19 DIAGNOSIS — O30042 Twin pregnancy, dichorionic/diamniotic, second trimester: Secondary | ICD-10-CM

## 2022-07-19 DIAGNOSIS — Z3A23 23 weeks gestation of pregnancy: Secondary | ICD-10-CM

## 2022-07-19 DIAGNOSIS — O0992 Supervision of high risk pregnancy, unspecified, second trimester: Secondary | ICD-10-CM | POA: Diagnosis not present

## 2022-07-19 DIAGNOSIS — O43192 Other malformation of placenta, second trimester: Secondary | ICD-10-CM

## 2022-07-19 NOTE — Progress Notes (Signed)
HIGH-RISK PREGNANCY VISIT Patient name: Christina Conley MRN [id number]  Date of birth: Aug 15, 1995 Chief Complaint:   Routine Prenatal Visit  History of Present Illness:   Christina Conley is a 27 y.o. 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    Today she reports no complaints. Contractions: Not present. Vag. Bleeding: None.  Movement: Present. denies leaking of fluid.      05/03/2022   10:21 AM 03/23/2022    1:07 PM 06/23/2021    3:37 PM 03/03/2021    2:39 PM 02/03/2021   10:18 AM  Depression screen PHQ 2/9  Decreased Interest 1 0 0 0 0  Down, Depressed, Hopeless 1 0 0 1 1  PHQ - 2 Score 2 0 0 1 1  Altered sleeping 0   3 3  Tired, decreased energy 2   3 3   Change in appetite 0   3 0  Feeling bad or failure about yourself  0   0 0  Trouble concentrating 0   0 0  Moving slowly or fidgety/restless 0   0 0  Suicidal thoughts 0   0 0  PHQ-9 Score 4   10 7   Difficult doing work/chores    Somewhat difficult Somewhat difficult        05/03/2022   10:21 AM 02/03/2021   10:19 AM 02/26/2020    2:56 PM 01/14/2020    9:48 AM  GAD 7 : Generalized Anxiety Score  Nervous, Anxious, on Edge 2 1 1 2   Control/stop worrying 0 1 2 1   Worry too much - different things 0 1 2 3   Trouble relaxing 0 0 3 1  Restless 0 0 0 0  Easily annoyed or irritable 3 1 2 3   Afraid - awful might happen 0 0 0 2  Total GAD 7 Score 5 4 10 12   Anxiety Difficulty  Somewhat difficult       Review of Systems:   Pertinent items are noted in HPI Denies abnormal vaginal discharge w/ itching/odor/irritation, headaches, visual changes, shortness of breath, chest pain, abdominal pain, severe nausea/vomiting, or problems with urination or bowel movements unless otherwise stated above. Pertinent History Reviewed:  Reviewed past medical,surgical, social,  obstetrical and family history.  Reviewed problem list, medications and allergies. Physical Assessment:   Vitals:   07/19/22 1142  BP: 106/74  Pulse: 92  Weight: 230 lb (104.3 kg)  Body mass index is 32.08 kg/m.           Physical Examination:   General appearance: alert, well appearing, and in no distress  Mental status: alert, oriented to person, place, and time  Skin: warm & dry   Extremities:      Cardiovascular: normal heart rate noted  Respiratory: normal respiratory effort, no distress  Abdomen: gravid, soft, non-tender  Pelvic: Cervical exam deferred         Fetal Status:     Movement: Present    Fetal Surveillance Testing today: sonogram   Chaperone: N/A    No results found for this or any previous visit (from the past 24 hour(s)).  Assessment & Plan:  High-risk pregnancy: [id number] at [redacted]w[redacted]d with an Estimated Date of Delivery: 11/10/22      ICD-10-CM   1. Supervision of high risk pregnancy in second trimester  O09.92     2.  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 Marginal insertion of umbilical cord affecting management of mother in second trimester  O43.192         Meds: No orders of the defined types were placed in this encounter.   Orders: No orders of the defined types were placed in this encounter.    Labs/procedures today: U/S  Treatment Plan:  scheduled    Follow-up: Return for keep scheduled.   Future Appointments  Date Time Provider Department Center  07/19/2022  1:30 PM Lazaro Arms, MD [facility]  07/28/2022  2:30 PM [facility] GENETIC COUNSELING RM [facility]  08/16/2022  9:15 AM [facility] [place] [facility] None  08/16/2022 10:50 AM Hermina Staggers, MD [facility]  09/13/2022  9:15 AM [facility] - [facility] [place] [facility] None  10/11/2022  9:15 AM [facility] - [facility] [place] [facility] None  03/29/2023  1:00 PM Anabel Halon, MD [facility] RPC    No orders of the defined types were placed in this encounter.  Lazaro Arms  Attending Physician for the Center for [facility] 07/19/2022 12:01 PM

## 2022-07-19 NOTE — Progress Notes (Signed)
[place] 23+5 wks,DC/DA TWINS,cx length 3.8 cm, BABY A:breech right,posterior placenta gr 0,SVP of fluid 5 cm,FHR 150 bpm,EFW 626 g 43%,discordance 8% BABY B: cephalic left,posterior placenta gr 0,fundal marginal cord insertion,SVP of fluid 5 cm,FHR 148 bpm,EFW 677 g 67%

## 2022-07-28 ENCOUNTER — Ambulatory Visit: Payer: Medicaid Other | Attending: Obstetrics and Gynecology

## 2022-07-29 ENCOUNTER — Encounter: Payer: Self-pay | Admitting: Women's Health

## 2022-08-16 ENCOUNTER — Ambulatory Visit (INDEPENDENT_AMBULATORY_CARE_PROVIDER_SITE_OTHER): Payer: Medicaid Other | Admitting: Obstetrics & Gynecology

## 2022-08-16 ENCOUNTER — Ambulatory Visit (INDEPENDENT_AMBULATORY_CARE_PROVIDER_SITE_OTHER): Payer: Medicaid Other

## 2022-08-16 ENCOUNTER — Other Ambulatory Visit: Payer: Medicaid Other

## 2022-08-16 ENCOUNTER — Encounter: Payer: Self-pay | Admitting: Obstetrics & Gynecology

## 2022-08-16 VITALS — BP 116/75 | HR 95 | Wt 238.0 lb

## 2022-08-16 DIAGNOSIS — Z3A27 27 weeks gestation of pregnancy: Secondary | ICD-10-CM

## 2022-08-16 DIAGNOSIS — O30042 Twin pregnancy, dichorionic/diamniotic, second trimester: Secondary | ICD-10-CM | POA: Diagnosis not present

## 2022-08-16 DIAGNOSIS — O30043 Twin pregnancy, dichorionic/diamniotic, third trimester: Secondary | ICD-10-CM

## 2022-08-16 DIAGNOSIS — O0992 Supervision of high risk pregnancy, unspecified, second trimester: Secondary | ICD-10-CM | POA: Diagnosis not present

## 2022-08-16 DIAGNOSIS — O0993 Supervision of high risk pregnancy, unspecified, third trimester: Secondary | ICD-10-CM | POA: Diagnosis not present

## 2022-08-16 DIAGNOSIS — Z3A28 28 weeks gestation of pregnancy: Secondary | ICD-10-CM

## 2022-08-16 DIAGNOSIS — O43193 Other malformation of placenta, third trimester: Secondary | ICD-10-CM

## 2022-08-16 DIAGNOSIS — Z131 Encounter for screening for diabetes mellitus: Secondary | ICD-10-CM

## 2022-08-16 DIAGNOSIS — O43192 Other malformation of placenta, second trimester: Secondary | ICD-10-CM

## 2022-08-16 NOTE — Progress Notes (Signed)
[place] DC/DA TWINS 27+5 wks,CX 3.6 cm,normal ovaries BABY A female:cephalic left,posterior placenta gr 0,SVP of fluid 5.1 cm,FHR 150 bpm,EFW 1219 g 64% BABY B female:breech right,posterior/fundal placenta gr 0,fundal marginal cord insertion,SVP of fluid 5.8 cm,FHR 138 bpm,discordance 3%,EFW 1181 g 54%

## 2022-08-17 LAB — GLUCOSE TOLERANCE, 2 HOURS W/ 1HR
Glucose, 1 hour: 151 mg/dL (ref 70–179)
Glucose, 2 hour: 95 mg/dL (ref 70–152)
Glucose, Fasting: 80 mg/dL (ref 70–91)

## 2022-08-17 LAB — CBC
Hematocrit: 28.1 % — ABNORMAL LOW (ref 34.0–46.6)
Hemoglobin: 9.1 g/dL — ABNORMAL LOW (ref 11.1–15.9)
MCH: 26 pg — ABNORMAL LOW (ref 26.6–33.0)
MCHC: 32.4 g/dL (ref 31.5–35.7)
MCV: 80 fL (ref 79–97)
Platelets: 287 10*3/uL (ref 150–450)
RBC: 3.5 x10E6/uL — ABNORMAL LOW (ref 3.77–5.28)
RDW: 13.9 % (ref 11.7–15.4)
WBC: 12.4 10*3/uL — ABNORMAL HIGH (ref 3.4–10.8)

## 2022-08-17 LAB — ANTIBODY SCREEN: Antibody Screen: NEGATIVE

## 2022-08-17 LAB — RPR: RPR Ser Ql: NONREACTIVE

## 2022-08-17 LAB — HIV ANTIBODY (ROUTINE TESTING W REFLEX): HIV Screen 4th Generation wRfx: NONREACTIVE

## 2022-08-22 NOTE — Progress Notes (Signed)
HIGH-RISK PREGNANCY VISIT Patient name: Christina Conley MRN [id number]  Date of birth: December 30, 1995 Chief Complaint:   Routine Prenatal Visit (PN2)  History of Present Illness:   Christina Conley is a 27 y.o. [phone] female at [redacted]w[redacted]d with an Estimated Date of Delivery: 11/10/22 being seen today for ongoing management of a high-risk pregnancy complicated by DCDA twin pregnancy with marginal vs  velamentous cord insertion(fundal), ^AFP with normal sonogram.    Today she reports no complaints. Contractions: Irregular. Vag. Bleeding: None.  Movement: Present. denies leaking of fluid.      05/03/2022   10:21 AM 03/23/2022    1:07 PM 06/23/2021    3:37 PM 03/03/2021    2:39 PM 02/03/2021   10:18 AM  Depression screen PHQ 2/9  Decreased Interest 1 0 0 0 0  Down, Depressed, Hopeless 1 0 0 1 1  PHQ - 2 Score 2 0 0 1 1  Altered sleeping 0   3 3  Tired, decreased energy 2   3 3   Change in appetite 0   3 0  Feeling bad or failure about yourself  0   0 0  Trouble concentrating 0   0 0  Moving slowly or fidgety/restless 0   0 0  Suicidal thoughts 0   0 0  PHQ-9 Score 4   10 7   Difficult doing work/chores    Somewhat difficult Somewhat difficult        05/03/2022   10:21 AM 02/03/2021   10:19 AM 02/26/2020    2:56 PM 01/14/2020    9:48 AM  GAD 7 : Generalized Anxiety Score  Nervous, Anxious, on Edge 2 1 1 2   Control/stop worrying 0 1 2 1   Worry too much - different things 0 1 2 3   Trouble relaxing 0 0 3 1  Restless 0 0 0 0  Easily annoyed or irritable 3 1 2 3   Afraid - awful might happen 0 0 0 2  Total GAD 7 Score 5 4 10 12   Anxiety Difficulty  Somewhat difficult       Review of Systems:   Pertinent items are noted in HPI Denies abnormal vaginal discharge w/ itching/odor/irritation, headaches, visual changes, shortness of breath, chest pain, abdominal pain, severe nausea/vomiting, or problems with urination or bowel movements unless otherwise stated above. Pertinent History Reviewed:   Reviewed past medical,surgical, social, obstetrical and family history.  Reviewed problem list, medications and allergies. Physical Assessment:   Vitals:   08/16/22 1033  BP: 116/75  Pulse: 95  Weight: 238 lb (108 kg)  Body mass index is 33.19 kg/m.           Physical Examination:   General appearance: alert, well appearing, and in no distress  Mental status: alert, oriented to person, place, and time  Skin: warm & dry   Extremities: Edema: None    Cardiovascular: normal heart rate noted  Respiratory: normal respiratory effort, no distress  Abdomen: gravid, soft, non-tender  Pelvic: Cervical exam deferred         Fetal Status:     Movement: Present    Fetal Surveillance Testing today: sonogram is normal see report   Chaperone: N/A    No results found for this or any previous visit (from the past 24 hour(s)).  Assessment & Plan:  High-risk pregnancy: [id number] at [redacted]w[redacted]d with an Estimated Date of Delivery: 11/10/22      ICD-10-CM   1. 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 Supervision of high risk pregnancy in second trimester  O09.92         Meds: No orders of the defined types were placed in this encounter.   Orders: No orders of the defined types were placed in this encounter.    Labs/procedures today: U/S  Treatment Plan:  routine    Follow-up: Return in about 3 weeks (around 09/06/2022) for HROB.   Future Appointments  Date Time Provider Department Center  09/05/2022 10:50 AM Myna Hidalgo, DO [facility]  09/13/2022  9:15 AM [facility] [place] [facility] None  09/13/2022 10:50 AM Cheral Marker, CNM [facility]  09/29/2022  9:10 AM Myna Hidalgo, DO [facility]  10/11/2022  9:15 AM [facility] [place] [facility] None  10/11/2022 10:50 AM Lazaro Arms, MD [facility]  10/18/2022  9:15 AM [facility] [place] [facility] None  10/18/2022 10:50 AM Lazaro Arms, MD [facility]  10/21/2022  9:50 AM [facility] N[facility]SE  [facility]  10/25/2022  9:15 AM [facility] [place] [facility] None  10/25/2022 10:50 AM Lazaro Arms, MD [facility]  10/28/2022  9:50 AM [facility] NURSE [facility]  03/29/2023  1:00 PM Anabel Halon, MD [facility]    No orders of the defined types were placed in this encounter.  Lazaro Arms  Attending Physician for the Center for [facility] 08/22/2022 4:30 PM

## 2022-08-29 ENCOUNTER — Encounter: Payer: Self-pay | Admitting: Obstetrics & Gynecology

## 2022-08-30 ENCOUNTER — Encounter: Payer: Self-pay | Admitting: Women's Health

## 2022-09-03 ENCOUNTER — Inpatient Hospital Stay (HOSPITAL_COMMUNITY)
Admission: AD | Admit: 2022-09-03 | Discharge: 2022-09-05 | DRG: 833 | Disposition: A | Discharge status: Home / Self Care | Payer: Medicaid Other | Attending: Obstetrics and Gynecology | Admitting: Obstetrics and Gynecology

## 2022-09-03 ENCOUNTER — Other Ambulatory Visit: Payer: Self-pay

## 2022-09-03 ENCOUNTER — Encounter (HOSPITAL_COMMUNITY): Payer: Self-pay | Admitting: Obstetrics & Gynecology

## 2022-09-03 DIAGNOSIS — Z7982 Long term (current) use of aspirin: Secondary | ICD-10-CM | POA: Diagnosis not present

## 2022-09-03 DIAGNOSIS — O30043 Twin pregnancy, dichorionic/diamniotic, third trimester: Secondary | ICD-10-CM | POA: Diagnosis present

## 2022-09-03 DIAGNOSIS — Z3A30 30 weeks gestation of pregnancy: Secondary | ICD-10-CM

## 2022-09-03 DIAGNOSIS — O99334 Smoking (tobacco) complicating childbirth: Secondary | ICD-10-CM | POA: Diagnosis not present

## 2022-09-03 DIAGNOSIS — O471 False labor at or after 37 completed weeks of gestation: Secondary | ICD-10-CM | POA: Diagnosis not present

## 2022-09-03 DIAGNOSIS — O4703 False labor before 37 completed weeks of gestation, third trimester: Principal | ICD-10-CM | POA: Diagnosis present

## 2022-09-03 DIAGNOSIS — O30049 Twin pregnancy, dichorionic/diamniotic, unspecified trimester: Secondary | ICD-10-CM | POA: Diagnosis not present

## 2022-09-03 DIAGNOSIS — O0990 Supervision of high risk pregnancy, unspecified, unspecified trimester: Secondary | ICD-10-CM

## 2022-09-03 DIAGNOSIS — Z87891 Personal history of nicotine dependence: Secondary | ICD-10-CM

## 2022-09-03 DIAGNOSIS — F17210 Nicotine dependence, cigarettes, uncomplicated: Secondary | ICD-10-CM | POA: Diagnosis present

## 2022-09-03 LAB — CBC
HCT: 29 % — ABNORMAL LOW (ref 36.0–46.0)
Hemoglobin: 9.2 g/dL — ABNORMAL LOW (ref 12.0–15.0)
MCH: 25.8 pg — ABNORMAL LOW (ref 26.0–34.0)
MCHC: 31.7 g/dL (ref 30.0–36.0)
MCV: 81.5 fL (ref 80.0–100.0)
Platelets: 274 10*3/uL (ref 150–400)
RBC: 3.56 MIL/uL — ABNORMAL LOW (ref 3.87–5.11)
RDW: 15.6 % — ABNORMAL HIGH (ref 11.5–15.5)
WBC: 11.9 10*3/uL — ABNORMAL HIGH (ref 4.0–10.5)
nRBC: 0 % (ref 0.0–0.2)

## 2022-09-03 LAB — WET PREP, GENITAL
Sperm: NONE SEEN
Trich, Wet Prep: NONE SEEN
WBC, Wet Prep HPF POC: >=10 — AB (ref <10)
Yeast Wet Prep HPF POC: NONE SEEN

## 2022-09-03 LAB — URINALYSIS, ROUTINE W REFLEX MICROSCOPIC
Bilirubin Urine: NEGATIVE
Glucose, UA: NEGATIVE mg/dL
Hgb urine dipstick: NEGATIVE
Ketones, ur: NEGATIVE mg/dL
Nitrite: NEGATIVE
Protein, ur: NEGATIVE mg/dL
Specific Gravity, Urine: 1.009 (ref 1.005–1.030)
pH: 6 (ref 5.0–8.0)

## 2022-09-03 LAB — BASIC METABOLIC PANEL
Anion gap: 9 (ref 5–15)
BUN: <5 mg/dL — ABNORMAL LOW (ref 6–20)
CO2: 20 mmol/L — ABNORMAL LOW (ref 22–32)
Calcium: 8.6 mg/dL — ABNORMAL LOW (ref 8.9–10.3)
Chloride: 105 mmol/L (ref 98–111)
Creatinine, Ser: 0.73 mg/dL (ref 0.44–1.00)
GFR, Estimated: >60 mL/min (ref >60)
Glucose, Bld: 94 mg/dL (ref 70–99)
Potassium: 3.6 mmol/L (ref 3.5–5.1)
Sodium: 134 mmol/L — ABNORMAL LOW (ref 135–145)

## 2022-09-03 LAB — TYPE AND SCREEN
ABO/RH(D): O POS
Antibody Screen: NEGATIVE

## 2022-09-03 MED ORDER — DOCUSATE SODIUM 100 MG PO CAPS
100.0000 mg | ORAL_CAPSULE | Freq: Every day | ORAL | Status: DC
  Filled 2022-09-03: qty 1

## 2022-09-03 MED ORDER — PRENATAL MULTIVITAMIN CH
1.0000 | ORAL_TABLET | Freq: Every day | ORAL | Status: DC
  Administered 2022-09-04: 1 via ORAL
  Filled 2022-09-03: qty 1

## 2022-09-03 MED ORDER — MAGNESIUM SULFATE 40 GM/1000ML IV SOLN
2.0000 g/h | INTRAVENOUS | Status: AC
  Administered 2022-09-03: 3 g/h via INTRAVENOUS
  Administered 2022-09-04: 2 g/h via INTRAVENOUS
  Filled 2022-09-03: qty 1000

## 2022-09-03 MED ORDER — LACTATED RINGERS IV SOLN: INTRAVENOUS | Status: DC

## 2022-09-03 MED ORDER — MAGNESIUM SULFATE 40 GM/1000ML IV SOLN
2.0000 g/h | INTRAVENOUS | Status: DC
  Filled 2022-09-03: qty 1000

## 2022-09-03 MED ORDER — NIFEDIPINE 10 MG PO CAPS
30.0000 mg | ORAL_CAPSULE | Freq: Once | ORAL | Status: AC
  Administered 2022-09-03: 30 mg via ORAL
  Filled 2022-09-03: qty 3

## 2022-09-03 MED ORDER — LACTATED RINGERS IV SOLN: 125.0000 mL/h | INTRAVENOUS | Status: AC

## 2022-09-03 MED ORDER — MAGNESIUM SULFATE BOLUS VIA INFUSION
4.0000 g | Freq: Once | INTRAVENOUS | Status: AC
  Administered 2022-09-03: 4 g via INTRAVENOUS
  Filled 2022-09-03: qty 1000

## 2022-09-03 MED ORDER — BETAMETHASONE SOD PHOS & ACET 6 (3-3) MG/ML IJ SUSP
12.0000 mg | INTRAMUSCULAR | Status: AC
  Administered 2022-09-03 – 2022-09-04 (×2): 12 mg via INTRAMUSCULAR
  Filled 2022-09-03: qty 5

## 2022-09-03 MED ORDER — ZOLPIDEM TARTRATE 5 MG PO TABS: 5.0000 mg | ORAL_TABLET | Freq: Every evening | ORAL | Status: DC | PRN

## 2022-09-03 MED ORDER — ACETAMINOPHEN 325 MG PO TABS
650.0000 mg | ORAL_TABLET | ORAL | Status: DC | PRN
  Administered 2022-09-04: 650 mg via ORAL
  Filled 2022-09-03: qty 2

## 2022-09-03 MED ORDER — FENTANYL CITRATE (PF) 100 MCG/2ML IJ SOLN
50.0000 ug | Freq: Once | INTRAMUSCULAR | Status: AC
  Administered 2022-09-03: 50 ug via INTRAVENOUS
  Filled 2022-09-03: qty 2

## 2022-09-03 MED ORDER — LACTATED RINGERS IV BOLUS
1000.0000 mL | Freq: Once | INTRAVENOUS | Status: AC
  Administered 2022-09-03: 1000 mL via INTRAVENOUS

## 2022-09-03 MED ORDER — CALCIUM CARBONATE ANTACID 500 MG PO CHEW
2.0000 | CHEWABLE_TABLET | ORAL | Status: DC | PRN
  Administered 2022-09-03 – 2022-09-05 (×3): 400 mg via ORAL
  Filled 2022-09-03 (×3): qty 2

## 2022-09-03 MED ORDER — TERBUTALINE SULFATE 1 MG/ML IJ SOLN: 0.2500 mg | Freq: Once | INTRAMUSCULAR | Status: DC | PRN

## 2022-09-03 NOTE — H&P (Signed)
History    CSN: [id number]   Arrival date and time: 09/03/22 1302    Event Date/Time   First Provider Initiated Contact with Patient 09/03/22 1344          Chief Complaint  Patient presents with   Contractions    Christina Conley is a 27 y.o. [id number] at [redacted]w[redacted]d w/ didi twins who presents for several days of fatigue, intermittent contractions, and intermittent left arm and hand tingling. She states she eats breakfast and dinner but skips lunch. She gets 3-4 hours a sleep at night and states its both hard to go to sleep and stay asleep. She has wrist splints from her prior pregnancy but has not worn them. She reports +FM. No LOF or VB.      OB History       Gravida  4   Para  2   Term  1   Preterm  1   AB  1   Living  3        SAB  1   IAB      Ectopic      Multiple  1   Live Births  3                  Past Medical History:  Diagnosis Date   Acute blood loss anemia 08/12/2020    EBL with delivery PO iron, IV venofer administered postpartum 1 U pRBCs Patient asymptomatic   Asthma      child.  No meds > 2 years   Environmental allergies     Gonorrhea 08/2015   Irregular intermenstrual bleeding 05/27/2015               Past Surgical History:  Procedure Laterality Date   NO PAST SURGERIES                  Family History  Problem Relation Age of Onset   Crohn's disease Mother     Other Father          problems with intestines   Other Son          absent right testicle   Hypertension Maternal Grandmother     Miscarriages / Stillbirths Maternal Grandmother     Hypertension Maternal Grandfather     Hyperlipidemia Maternal Grandfather     Crohn's disease Paternal Grandmother            Social History  Social History         Tobacco Use   Smoking status: Former      Current packs/day: 0.25      Average packs/day: 0.3 packs/day for 0.5 years (0.1 ttl pk-yrs)      Types: Cigarettes   Smokeless tobacco: Never  Vaping Use   Vaping  status: Never Used  Substance Use Topics   Alcohol use: Not Currently      Comment: occ   Drug use: Not Currently      Types: Marijuana            Allergies  Allergen Reactions   Amoxicillin Hives   Ampicillin Hives   Penicillins Hives      Has patient had a PCN reaction causing immediate rash, facial/tongue/throat swelling, SOB or lightheadedness with hypotension: Yes Has patient had a PCN reaction causing severe rash involving mucus membranes or skin necrosis: No Has patient had a PCN reaction that required hospitalization No Has patient had a PCN reaction occurring within the last 10  years: unknown If all of the above answers are "NO", then may proceed with Cephalosporin use.                 Medications Prior to Admission  Medication Sig Dispense Refill Last Dose   aspirin EC 81 MG tablet Take 1 tablet (81 mg total) by mouth daily. Swallow whole. 90 tablet 3 Past Month   ferrous sulfate 325 (65 FE) MG EC tablet Take 325 mg by mouth 3 (three) times daily with meals.     09/03/2022   prenatal vitamin w/FE, FA (PRENATAL 1 + 1) 27-1 MG TABS tablet Take 1 tablet by mouth daily at 12 noon. 30 tablet 12 09/03/2022   Blood Pressure Monitor MISC For regular home bp monitoring during pregnancy 1 each 0     Doxylamine-Pyridoxine (DICLEGIS) 10-10 MG TBEC 2 tabs q hs, if sx persist add 1 tab q am on day 3, if sx persist add 1 tab q afternoon on day 4 (Patient not taking: Reported on 05/31/2022) 100 tablet 6     metoCLOPramide (REGLAN) 10 MG tablet Take 1 tablet (10 mg total) by mouth every 8 (eight) hours as needed for nausea or vomiting. (Patient not taking: Reported on 05/31/2022) 30 tablet 0           Review of Systems  Constitutional:  Positive for activity change and appetite change.  Gastrointestinal:  Negative for vomiting.  Genitourinary:  Negative for difficulty urinating and vaginal bleeding.  Neurological:  Positive for weakness and numbness.  Psychiatric/Behavioral:  Positive for  sleep disturbance.     Physical Exam    Blood pressure 118/74, pulse (!) 102, temperature 98.4 F (36.9 C), temperature source Oral, resp. rate 18, last menstrual period 01/28/2022. NST:  Baseline: 135 bpm x 2, Variability: moderate x 2, Accelerations: Reactive x 2, and Decelerations: Absent x 2 Toco: q 1-2 minutes   Physical Exam Constitutional:      General: She is not in acute distress.    Appearance: She is not ill-appearing, toxic-appearing or diaphoretic.  Cardiovascular:     Rate and Rhythm: Tachycardia present.  Pulmonary:     Effort: Pulmonary effort is normal.  Abdominal:     Comments: gravid  Neurological:     General: No focal deficit present.     Mental Status: She is alert and oriented to person, place, and time.  Psychiatric:        Mood and Affect: Mood normal.        Behavior: Behavior normal.   Cervix: Dilation: 2.5 Effacement (%): 70 Station: -2 Presentation: Vertex Exam by:: S. Autry-Lott   Bedside scan: Vertex/breech presentation  Results: Results for orders placed or performed during the hospital encounter of 09/03/22 (from the past 24 hour(s))  Urinalysis, Routine w reflex microscopic -Urine, Clean Catch     Status: Abnormal   Collection Time: 09/03/22  1:41 PM  Result Value Ref Range   Color, Urine YELLOW YELLOW   APPearance HAZY (A) CLEAR   Specific Gravity, Urine 1.009 1.005 - 1.030   pH 6.0 5.0 - 8.0   Glucose, UA NEGATIVE NEGATIVE mg/dL   Hgb urine dipstick NEGATIVE NEGATIVE   Bilirubin Urine NEGATIVE NEGATIVE   Ketones, ur NEGATIVE NEGATIVE mg/dL   Protein, ur NEGATIVE NEGATIVE mg/dL   Nitrite NEGATIVE NEGATIVE   Leukocytes,Ua SMALL (A) NEGATIVE   RBC / HPF 0-5 0 - 5 RBC/hpf   WBC, UA 0-5 0 - 5 WBC/hpf   Bacteria, UA RARE (A)  NONE SEEN   Squamous Epithelial / HPF 21-50 0 - 5 /HPF   Mucus PRESENT   CBC     Status: Abnormal   Collection Time: 09/03/22  2:14 PM  Result Value Ref Range   WBC 11.9 (H) 4.0 - 10.5 K/uL   RBC 3.56 (L)  3.87 - 5.11 MIL/uL   Hemoglobin 9.2 (L) 12.0 - 15.0 g/dL   HCT 40.9 (L) 81.1 - 91.4 %   MCV 81.5 80.0 - 100.0 fL   MCH 25.8 (L) 26.0 - 34.0 pg   MCHC 31.7 30.0 - 36.0 g/dL   RDW 78.2 (H) 95.6 - 21.3 %   Platelets 274 150 - 400 K/uL   nRBC 0.0 0.0 - 0.2 %  Basic metabolic panel     Status: Abnormal   Collection Time: 09/03/22  2:17 PM  Result Value Ref Range   Sodium 134 (L) 135 - 145 mmol/L   Potassium 3.6 3.5 - 5.1 mmol/L   Chloride 105 98 - 111 mmol/L   CO2 20 (L) 22 - 32 mmol/L   Glucose, Bld 94 70 - 99 mg/dL   BUN <5 (L) 6 - 20 mg/dL   Creatinine, Ser 0.86 0.44 - 1.00 mg/dL   Calcium 8.6 (L) 8.9 - 10.3 mg/dL   GFR, Estimated >57 >84 mL/min   Anion gap 9 5 - 15  Type and screen [facility]     Status: None (Preliminary result)   Collection Time: 09/03/22  3:18 PM  Result Value Ref Range   ABO/RH(D) PENDING    Antibody Screen PENDING    Sample Expiration      09/06/2022,2359 Performed at [facility], [address] [place], [place] [address]     MAU Course  Procedures: Labs  MDM Patient received one dose of Procardia and IV fluids which did not help her contractions Patient asked to be admitted for observation, she lives over an hour away and is concerned about progressing PTL.     Assessment and Plan   Concern for preterm labor, di/di twins at [redacted]w[redacted]d.  Patient met, discussed plan as follows: - Admit to OBSC - Will start betamethasone regimen - Magnesium sulfate to be started for tocolysis and neuroprotection - Neonatology consulted - Continue FHR monitoring - Follow up labs - Continue close observation.   Jaynie Collins, MD, FACOG Obstetrician & Gynecologist, [facility] for [employer], [facility]

## 2022-09-03 NOTE — Plan of Care (Signed)
  Problem: Education: Goal: Knowledge of disease or condition will improve Outcome: Progressing Goal: Knowledge of the prescribed therapeutic regimen will improve Outcome: Progressing Goal: Individualized Educational Video(s) Outcome: Progressing   Problem: Clinical Measurements: Goal: Complications related to the disease process, condition or treatment will be avoided or minimized Outcome: Progressing   Problem: Education: Goal: Knowledge of General Education information will improve Description: Including pain rating scale, medication(s)/side effects and non-pharmacologic comfort measures Outcome: Progressing   Problem: Health Behavior/Discharge Planning: Goal: Ability to manage health-related needs will improve Outcome: Progressing   Problem: Clinical Measurements: Goal: Ability to maintain clinical measurements within normal limits will improve Outcome: Progressing Goal: Will remain free from infection Outcome: Progressing Goal: Diagnostic test results will improve Outcome: Progressing Goal: Respiratory complications will improve Outcome: Progressing Goal: Cardiovascular complication will be avoided Outcome: Progressing   Problem: Activity: Goal: Risk for activity intolerance will decrease Outcome: Progressing   Problem: Nutrition: Goal: Adequate nutrition will be maintained Outcome: Progressing   Problem: Coping: Goal: Level of anxiety will decrease Outcome: Progressing   Problem: Elimination: Goal: Will not experience complications related to bowel motility Outcome: Progressing Goal: Will not experience complications related to urinary retention Outcome: Progressing   Problem: Pain Managment: Goal: General experience of comfort will improve Outcome: Progressing   Problem: Safety: Goal: Ability to remain free from injury will improve Outcome: Progressing   Problem: Skin Integrity: Goal: Risk for impaired skin integrity will decrease Outcome:  Progressing   

## 2022-09-03 NOTE — Progress Notes (Signed)
Patient declined IV fluids. Patient educated on her decision. Patient ok with PO fluids for now and will be ok with IV fluids at a later time if oral fluids do not help.

## 2022-09-03 NOTE — Consult Note (Signed)
Neonatology Consult to Antenatal Patient:  I was asked by Dr. Macon Large to see this patient in order to provide antenatal counseling due to threatening preterm delivery.  Christina Conley was admitted today at [redacted]w[redacted]d EGA with concerns for preterm labor.  Membranes intact. No bleeding.  Pregnancy complicated by didi female twins with possible marginal cord.  Good fetal movement.  She is getting BMZ and IV Magnesium.  Prior use of THC and tobacco.  And, she has had previous twin boys 2 years ago at ~36w.  I spoke with the patient and her mother. We discussed the worst case of delivery in the next 1-2 days, including usual DR management, possible respiratory complications and need for support, IV access, temperature, feedings (mother desires breast feeding, which was encouraged, and open to use of donor milk as a bridge) and LOS.  She did have usual questions at this time which I answered.  Thank you for asking me to see this patient.  Dineen Kid Leary Roca, MD Neonatologist  The total length of face-to-face or floor/unit time for this encounter was 25 minutes. Counseling and/or coordination of care was 40 minutes of the above.

## 2022-09-03 NOTE — MAU Provider Note (Signed)
Please refer to H & P for admission details.    Carrera Kiesel, MD, FACOG Attending Obstetrician & Gynecologist Faculty Practice, [facility]   

## 2022-09-03 NOTE — MAU Note (Signed)
Christina Conley is a 27 y.o. at [redacted]w[redacted]d here in MAU reporting: irregular contractions that started at 0945 this morning. States they are sporadic and has not been able to time them.Denies any LOF or VB and endorses +FM Reports increase in general fatigue and does not feel like her normal self today. Reports tingling in her left arm and fingers that comes and goes.  Reports nausea and 3 normal formed bowel movements today. Denies any urinary symptoms. Denies any vaginal pain, odor or itching. Last recent sexual intercourse yesterday morning.  Onset of complaint: 0945 Pain score: 0 Vitals:   09/03/22 1327  BP: 118/74  Pulse: (!) 102  Resp: 18  Temp: 98.4 F (36.9 C)     FHT: baby A 143  baby b 137 Lab orders placed from triage:

## 2022-09-04 ENCOUNTER — Other Ambulatory Visit: Payer: Self-pay | Admitting: Obstetrics & Gynecology

## 2022-09-04 DIAGNOSIS — O30043 Twin pregnancy, dichorionic/diamniotic, third trimester: Secondary | ICD-10-CM

## 2022-09-04 DIAGNOSIS — Z3A30 30 weeks gestation of pregnancy: Secondary | ICD-10-CM | POA: Diagnosis not present

## 2022-09-04 LAB — COMPREHENSIVE METABOLIC PANEL
ALT: 13 U/L (ref 0–44)
AST: 19 U/L (ref 15–41)
Albumin: 2.4 g/dL — ABNORMAL LOW (ref 3.5–5.0)
Alkaline Phosphatase: 81 U/L (ref 38–126)
Anion gap: 8 (ref 5–15)
BUN: <5 mg/dL — ABNORMAL LOW (ref 6–20)
CO2: 20 mmol/L — ABNORMAL LOW (ref 22–32)
Calcium: 7.5 mg/dL — ABNORMAL LOW (ref 8.9–10.3)
Chloride: 106 mmol/L (ref 98–111)
Creatinine, Ser: 0.92 mg/dL (ref 0.44–1.00)
GFR, Estimated: >60 mL/min (ref >60)
Glucose, Bld: 131 mg/dL — ABNORMAL HIGH (ref 70–99)
Potassium: 3.7 mmol/L (ref 3.5–5.1)
Sodium: 134 mmol/L — ABNORMAL LOW (ref 135–145)
Total Bilirubin: <0.1 mg/dL — ABNORMAL LOW (ref 0.3–1.2)
Total Protein: 6.2 g/dL — ABNORMAL LOW (ref 6.5–8.1)

## 2022-09-04 LAB — CBC WITH DIFFERENTIAL/PLATELET
Abs Immature Granulocytes: 0.2 10*3/uL — ABNORMAL HIGH (ref 0.00–0.07)
Basophils Absolute: 0 10*3/uL (ref 0.0–0.1)
Basophils Relative: 0 %
Eosinophils Absolute: 0 10*3/uL (ref 0.0–0.5)
Eosinophils Relative: 0 %
HCT: 25.4 % — ABNORMAL LOW (ref 36.0–46.0)
Hemoglobin: 8.1 g/dL — ABNORMAL LOW (ref 12.0–15.0)
Immature Granulocytes: 1 %
Lymphocytes Relative: 8 %
Lymphs Abs: 1.3 10*3/uL (ref 0.7–4.0)
MCH: 25.1 pg — ABNORMAL LOW (ref 26.0–34.0)
MCHC: 31.9 g/dL (ref 30.0–36.0)
MCV: 78.6 fL — ABNORMAL LOW (ref 80.0–100.0)
Monocytes Absolute: 0.7 10*3/uL (ref 0.1–1.0)
Monocytes Relative: 4 %
Neutro Abs: 13.3 10*3/uL — ABNORMAL HIGH (ref 1.7–7.7)
Neutrophils Relative %: 87 %
Platelets: 283 10*3/uL (ref 150–400)
RBC: 3.23 MIL/uL — ABNORMAL LOW (ref 3.87–5.11)
RDW: 15.9 % — ABNORMAL HIGH (ref 11.5–15.5)
WBC: 15.5 10*3/uL — ABNORMAL HIGH (ref 4.0–10.5)
nRBC: 0 % (ref 0.0–0.2)

## 2022-09-04 LAB — RUPTURE OF MEMBRANE (ROM)PLUS: Rom Plus: NEGATIVE

## 2022-09-04 LAB — CULTURE, OB URINE
Culture: NO GROWTH
Special Requests: NORMAL

## 2022-09-04 LAB — MAGNESIUM: Magnesium: 5.1 mg/dL — ABNORMAL HIGH (ref 1.7–2.4)

## 2022-09-04 MED ORDER — NIFEDIPINE 10 MG PO CAPS
10.0000 mg | ORAL_CAPSULE | Freq: Three times a day (TID) | ORAL | Status: DC
  Administered 2022-09-04 – 2022-09-05 (×2): 10 mg via ORAL
  Filled 2022-09-04 (×2): qty 1

## 2022-09-04 MED ORDER — METRONIDAZOLE 500 MG PO TABS
500.0000 mg | ORAL_TABLET | Freq: Two times a day (BID) | ORAL | Status: DC
  Administered 2022-09-04 (×2): 500 mg via ORAL
  Filled 2022-09-04 (×2): qty 1

## 2022-09-04 NOTE — Progress Notes (Signed)
    Faculty Practice OB/GYN Attending Note  Subjective:  Called to evaluate patient with feeling pelvic pressure and contractions.   FHR reassuring x 2, no LOF or vaginal bleeding. Good FM  x 2.   Admitted on 09/03/2022 for Preterm labor in third trimester.    Objective:  Blood pressure 126/79, pulse (!) 110, temperature 97.8 F (36.6 C), temperature source Oral, resp. rate 18, last menstrual period 01/28/2022, SpO2 100%. FHT  Baseline 135 bpm x 2, moderate variability  x 2, +accelerations x 2, no decelerations  x 2 Toco: 3-4 per hour on tocol Gen: NAD HENT: Normocephalic, atraumatic Lungs: Normal respiratory effort Heart: Regular rate noted Abdomen: NT gravid fundus, soft Ext: 2+ DTRs, no edema, no cyanosis, negative Homan's sign Cervix: Dilation: 2.5 Effacement (%): 50 Station: Ballotable Presentation: Vertex Exam by:: Dr, Macon Large  Assessment & Plan:  27 y.o. [id number] at [redacted]w[redacted]d admitted for concern for preterm bleeding, twin gestation. - No cervical change - Continue magnesium sulfate infusion for tocolysis and neuroprotection for now - Category 1 FHR tracing x 2  - Continue close observation.   Jaynie Collins, MD, FACOG Obstetrician & Gynecologist, [facility] for [employer], [facility]

## 2022-09-04 NOTE — Progress Notes (Signed)
Baby A tracing maternal HR from 1536 until 1600 due to pt sitting up to eat. Baby movements palpated by this RN.

## 2022-09-04 NOTE — Progress Notes (Signed)
Patient ID: Christina Conley, female   DOB: 08/24/1995, 27 y.o.   MRN: [id number] FACULTY PRACTICE ANTEPARTUM(COMPREHENSIVE) NOTE  Christina Conley is a 27 y.o. [phone] with Estimated Date of Delivery: 11/10/22   By  early ultrasound [redacted]w[redacted]d  who is admitted for preterm labor with DCDA twin pregnancy.    Fetal presentation is cephalic./breech Length of Stay:  1  Days  Date of admission:09/03/2022  Subjective: Feels awful from the magnesium, no contractions are noted Patient reports the fetal movement as active. Patient reports uterine contraction  activity as none. Patient reports  vaginal bleeding as none. Patient describes fluid per vagina as None.  Vitals:  Blood pressure 120/76, pulse (!) 112, temperature 98.2 F (36.8 C), temperature source Oral, resp. rate 17, last menstrual period 01/28/2022, SpO2 99%. Vitals:   09/04/22 0005 09/04/22 0103 09/04/22 0205 09/04/22 0610  BP: 121/78 128/74 123/68 120/76  Pulse: (!) 106 (!) 103 (!) 107 (!) 112  Resp: 19 18 17    Temp:    98.2 F (36.8 C)  TempSrc:    Oral  SpO2: 96% 98% 98% 99%   Physical Examination:  General appearance - somnolent but interactive Abdomen - soft, nontender, nondistended, no masses or organomegaly Fundal Height:  size greater than dates Pelvic Exam:  examination not indicated Cervical Exam: Not evaluated.  Extremities: extremities normal, atraumatic, no cyanosis or edema with DTRs 2+ bilaterally Membranes:intact  Fetal Monitoring:  Baseline: 140s bpm, Variability: Good {> 6 bpm), Accelerations: Reactive, and Decelerations: Absent   reactive  Labs:  Results for orders placed or performed during the hospital encounter of 09/03/22 (from the past 24 hour(s))  Urinalysis, Routine w reflex microscopic -Urine, Clean Catch   Collection Time: 09/03/22  1:41 PM  Result Value Ref Range   Color, Urine YELLOW YELLOW   APPearance HAZY (A) CLEAR   Specific Gravity, Urine 1.009 1.005 - 1.030   pH 6.0 5.0 - 8.0   Glucose, UA  NEGATIVE NEGATIVE mg/dL   Hgb urine dipstick NEGATIVE NEGATIVE   Bilirubin Urine NEGATIVE NEGATIVE   Ketones, ur NEGATIVE NEGATIVE mg/dL   Protein, ur NEGATIVE NEGATIVE mg/dL   Nitrite NEGATIVE NEGATIVE   Leukocytes,Ua SMALL (A) NEGATIVE   RBC / HPF 0-5 0 - 5 RBC/hpf   WBC, UA 0-5 0 - 5 WBC/hpf   Bacteria, UA RARE (A) NONE SEEN   Squamous Epithelial / HPF 21-50 0 - 5 /HPF   Mucus PRESENT   CBC   Collection Time: 09/03/22  2:14 PM  Result Value Ref Range   WBC 11.9 (H) 4.0 - 10.5 K/uL   RBC 3.56 (L) 3.87 - 5.11 MIL/uL   Hemoglobin 9.2 (L) 12.0 - 15.0 g/dL   HCT 14.7 (L) 82.9 - 56.2 %   MCV 81.5 80.0 - 100.0 fL   MCH 25.8 (L) 26.0 - 34.0 pg   MCHC 31.7 30.0 - 36.0 g/dL   RDW 13.0 (H) 86.5 - 78.4 %   Platelets 274 150 - 400 K/uL   nRBC 0.0 0.0 - 0.2 %  Basic metabolic panel   Collection Time: 09/03/22  2:17 PM  Result Value Ref Range   Sodium 134 (L) 135 - 145 mmol/L   Potassium 3.6 3.5 - 5.1 mmol/L   Chloride 105 98 - 111 mmol/L   CO2 20 (L) 22 - 32 mmol/L   Glucose, Bld 94 70 - 99 mg/dL   BUN <5 (L) 6 - 20 mg/dL   Creatinine, Ser 6.96 0.44 - 1.00 mg/dL  Calcium 8.6 (L) 8.9 - 10.3 mg/dL   GFR, Estimated >40 >98 mL/min   Anion gap 9 5 - 15  Type and screen [facility]   Collection Time: 09/03/22  3:18 PM  Result Value Ref Range   ABO/RH(D) O POS    Antibody Screen NEG    Sample Expiration      09/06/2022,2359 Performed at [facility], [address] [place], [place] [address]   Wet prep, genital   Collection Time: 09/03/22  6:23 PM   Specimen: Vaginal  Result Value Ref Range   Yeast Wet Prep HPF POC NONE SEEN NONE SEEN   Trich, Wet Prep NONE SEEN NONE SEEN   Clue Cells Wet Prep HPF POC PRESENT (A) NONE SEEN   WBC, Wet Prep HPF POC >=10 (A) <10   Sperm NONE SEEN   CBC with Differential/Platelet   Collection Time: 09/04/22  4:43 AM  Result Value Ref Range   WBC 15.5 (H) 4.0 - 10.5 K/uL   RBC 3.23 (L) 3.87 - 5.11 MIL/uL   Hemoglobin 8.1  (L) 12.0 - 15.0 g/dL   HCT 78.2 (L) 95.6 - 21.3 %   MCV 78.6 (L) 80.0 - 100.0 fL   MCH 25.1 (L) 26.0 - 34.0 pg   MCHC 31.9 30.0 - 36.0 g/dL   RDW 08.6 (H) 57.8 - 46.9 %   Platelets 283 150 - 400 K/uL   nRBC 0.0 0.0 - 0.2 %   Neutrophils Relative % 87 %   Neutro Abs 13.3 (H) 1.7 - 7.7 K/uL   Lymphocytes Relative 8 %   Lymphs Abs 1.3 0.7 - 4.0 K/uL   Monocytes Relative 4 %   Monocytes Absolute 0.7 0.1 - 1.0 K/uL   Eosinophils Relative 0 %   Eosinophils Absolute 0.0 0.0 - 0.5 K/uL   Basophils Relative 0 %   Basophils Absolute 0.0 0.0 - 0.1 K/uL   Immature Granulocytes 1 %   Abs Immature Granulocytes 0.20 (H) 0.00 - 0.07 K/uL  Magnesium   Collection Time: 09/04/22  4:43 AM  Result Value Ref Range   Magnesium 5.1 (H) 1.7 - 2.4 mg/dL  Comprehensive metabolic panel   Collection Time: 09/04/22  4:43 AM  Result Value Ref Range   Sodium 134 (L) 135 - 145 mmol/L   Potassium 3.7 3.5 - 5.1 mmol/L   Chloride 106 98 - 111 mmol/L   CO2 20 (L) 22 - 32 mmol/L   Glucose, Bld 131 (H) 70 - 99 mg/dL   BUN <5 (L) 6 - 20 mg/dL   Creatinine, Ser 6.29 0.44 - 1.00 mg/dL   Calcium 7.5 (L) 8.9 - 10.3 mg/dL   Total Protein 6.2 (L) 6.5 - 8.1 g/dL   Albumin 2.4 (L) 3.5 - 5.0 g/dL   AST 19 15 - 41 U/L   ALT 13 0 - 44 U/L   Alkaline Phosphatase 81 38 - 126 U/L   Total Bilirubin <0.1 (L) 0.3 - 1.2 mg/dL   GFR, Estimated >52 >84 mL/min   Anion gap 8 5 - 15    Imaging Studies:    No results found.   Medications:  Scheduled  betamethasone acetate-betamethasone sodium phosphate  12 mg Intramuscular Q24H   docusate sodium  100 mg Oral Daily   prenatal multivitamin  1 tablet Oral Q1200   I have reviewed the patient's current medications.  ASSESSMENT: [id number] [redacted]w[redacted]d Estimated Date of Delivery: 11/10/22  DCDA twins PTL   Patient Active Problem List  Diagnosis Date Noted   Threatened preterm labor, third trimester 09/03/2022   Preterm labor in third trimester 09/03/2022   Abnormal chromosomal and  genetic finding on antenatal screening mother 06/06/2022   Supervision of high-risk pregnancy 05/03/2022   Dichorionic diamniotic twin pregnancy 05/03/2022   History of iron deficiency anemia 03/23/2022   MDD (major depressive disorder) 02/03/2021   Diastasis recti 11/26/2020   Pelvic pain 10/28/2020   Umbilical hernia without obstruction and without gangrene 10/28/2020    PLAN: >magnesium is still going til 2000  >BMZ series being given >will start procardia 10 mg q8h@2000   Lazaro Arms 09/04/2022,7:45 AM

## 2022-09-05 ENCOUNTER — Encounter: Payer: Medicaid Other | Admitting: Obstetrics & Gynecology

## 2022-09-05 DIAGNOSIS — O30043 Twin pregnancy, dichorionic/diamniotic, third trimester: Secondary | ICD-10-CM

## 2022-09-05 DIAGNOSIS — Z3A30 30 weeks gestation of pregnancy: Secondary | ICD-10-CM | POA: Diagnosis not present

## 2022-09-05 LAB — CULTURE, BETA STREP (GROUP B ONLY)

## 2022-09-05 LAB — OB RESULTS CONSOLE GBS: GBS: POSITIVE

## 2022-09-05 LAB — GC/CHLAMYDIA PROBE AMP (~~LOC~~) NOT AT ARMC
Chlamydia: NEGATIVE
Comment: NEGATIVE
Comment: NORMAL
Neisseria Gonorrhea: NEGATIVE

## 2022-09-05 MED ORDER — NIFEDIPINE 10 MG PO CAPS: 10.0000 mg | ORAL_CAPSULE | Freq: Three times a day (TID) | ORAL | 1 refills | Status: DC

## 2022-09-05 MED ORDER — METRONIDAZOLE 500 MG PO TABS: 500.0000 mg | ORAL_TABLET | Freq: Two times a day (BID) | ORAL | 0 refills | Status: DC

## 2022-09-05 NOTE — Progress Notes (Signed)
   09/05/22 1050  Departure Condition  Departure Condition Good  Mobility at [facility]  Patient/Caregiver Teaching Teach Back Method Used;Discharge instructions reviewed;Prescriptions reviewed;Follow-up care reviewed;Pain management discussed;Medications discussed;Patient/caregiver verbalized understanding;Educated about hypertension in pregnancy  Departure Mode With family  Was procedural sedation performed on this patient during this visit? No   Patient alert and oriented x4, VS and pain stable at discharge.

## 2022-09-05 NOTE — Discharge Summary (Signed)
Physician Discharge Summary  Patient ID: ASIYA CUTBIRTH MRN: [id number] DOB/AGE: [birth year] 27 y.o.  Admit date: 09/03/2022 Discharge date: 09/05/2022  Admission Diagnoses: Twins PTL  Discharge Diagnoses:  Principal Problem:   Preterm labor in third trimester Active Problems:   Supervision of high-risk pregnancy   Dichorionic diamniotic twin pregnancy   Threatened preterm labor, third trimester   Discharged Condition: good  Hospital Course: tocolyzed successfully  Consults:   Significant Diagnostic Studies:   Treatments: magnesium  Discharge Exam: Blood pressure 105/63, pulse (!) 102, temperature 98 F (36.7 C), temperature source Oral, resp. rate 17, last menstrual period 01/28/2022, SpO2 100%. General appearance: alert, cooperative, and no distress GI: soft, non-tender; bowel sounds normal; no masses,  no organomegaly  Disposition: Discharge disposition: 01-Home or Self Care       Discharge Instructions     Call MD for:  persistant nausea and vomiting   Complete by: As directed    Call MD for:  severe uncontrolled pain   Complete by: As directed    Call MD for:  temperature >100.4   Complete by: As directed    Diet - low sodium heart healthy   Complete by: As directed    Increase activity slowly   Complete by: As directed         Follow-up Information     [facility] for [facility] at [facility] Follow up on 09/07/2022.   Specialty: Obstetrics and Gynecology Why: prenatal visit Contact information: [address] [place] [address] [phone]                Signed: Lazaro Arms 09/05/2022, 8:20 AM

## 2022-09-05 NOTE — Plan of Care (Signed)

## 2022-09-06 ENCOUNTER — Encounter: Payer: Self-pay | Admitting: Obstetrics and Gynecology

## 2022-09-07 ENCOUNTER — Encounter: Payer: Self-pay | Admitting: Obstetrics & Gynecology

## 2022-09-07 ENCOUNTER — Ambulatory Visit: Payer: Medicaid Other | Admitting: Obstetrics & Gynecology

## 2022-09-07 VITALS — BP 116/71 | HR 110 | Wt 240.0 lb

## 2022-09-07 DIAGNOSIS — O0993 Supervision of high risk pregnancy, unspecified, third trimester: Secondary | ICD-10-CM

## 2022-09-07 DIAGNOSIS — O43192 Other malformation of placenta, second trimester: Secondary | ICD-10-CM

## 2022-09-07 DIAGNOSIS — O43193 Other malformation of placenta, third trimester: Secondary | ICD-10-CM

## 2022-09-07 DIAGNOSIS — Z3A30 30 weeks gestation of pregnancy: Secondary | ICD-10-CM

## 2022-09-07 DIAGNOSIS — O30043 Twin pregnancy, dichorionic/diamniotic, third trimester: Secondary | ICD-10-CM

## 2022-09-07 MED ORDER — NEOMYCIN-POLYMYXIN-DEXAMETH 3.5-10000-0.1 OP OINT: 1.0000 | TOPICAL_OINTMENT | Freq: Three times a day (TID) | OPHTHALMIC | 1 refills | Status: DC

## 2022-09-07 NOTE — Progress Notes (Signed)
HIGH-RISK PREGNANCY VISIT Patient name: Christina Conley MRN [id number]  Date of birth: 06/10/1995 Chief Complaint:   Routine Prenatal Visit  History of Present Illness:   Christina Conley is a 27 y.o. [id number] female at [redacted]w[redacted]d with an Estimated Date of Delivery: 11/10/22 being seen today for ongoing management of a high-risk pregnancy complicated by DCDA twins with PTL.    Today she reports no complaints. Contractions: Irritability. Vag. Bleeding: None.  Movement: Present. denies leaking of fluid.      05/03/2022   10:21 AM 03/23/2022    1:07 PM 06/23/2021    3:37 PM 03/03/2021    2:39 PM 02/03/2021   10:18 AM  Depression screen PHQ 2/9  Decreased Interest 1 0 0 0 0  Down, Depressed, Hopeless 1 0 0 1 1  PHQ - 2 Score 2 0 0 1 1  Altered sleeping 0   3 3  Tired, decreased energy 2   3 3   Change in appetite 0   3 0  Feeling bad or failure about yourself  0   0 0  Trouble concentrating 0   0 0  Moving slowly or fidgety/restless 0   0 0  Suicidal thoughts 0   0 0  PHQ-9 Score 4   10 7   Difficult doing work/chores    Somewhat difficult Somewhat difficult        05/03/2022   10:21 AM 02/03/2021   10:19 AM 02/26/2020    2:56 PM 01/14/2020    9:48 AM  GAD 7 : Generalized Anxiety Score  Nervous, Anxious, on Edge 2 1 1 2   Control/stop worrying 0 1 2 1   Worry too much - different things 0 1 2 3   Trouble relaxing 0 0 3 1  Restless 0 0 0 0  Easily annoyed or irritable 3 1 2 3   Afraid - awful might happen 0 0 0 2  Total GAD 7 Score 5 4 10 12   Anxiety Difficulty  Somewhat difficult       Review of Systems:   Pertinent items are noted in HPI Denies abnormal vaginal discharge w/ itching/odor/irritation, headaches, visual changes, shortness of breath, chest pain, abdominal pain, severe nausea/vomiting, or problems with urination or bowel movements unless otherwise stated above. Pertinent History Reviewed:  Reviewed past medical,surgical, social, obstetrical and family history.  Reviewed  problem list, medications and allergies. Physical Assessment:   Vitals:   09/07/22 1459  BP: 116/71  Pulse: (!) 110  Weight: 240 lb (108.9 kg)  Body mass index is 33.47 kg/m.           Physical Examination:   General appearance: alert, well appearing, and in no distress  Mental status: alert, oriented to person, place, and time  Skin: warm & dry   Extremities:      Cardiovascular: normal heart rate noted  Respiratory: normal respiratory effort, no distress  Abdomen: gravid, soft, non-tender  Pelvic: Cervical exam deferred         Fetal Status: Fetal Heart Rate (bpm): 147/140 Fundal Height: 39 cm Movement: Present    Fetal Surveillance Testing today: 147/140   Chaperone: N/A    No results found for this or any previous visit (from the past 24 hour(s)).  Assessment & Plan:  High-risk pregnancy: [id number] at [redacted]w[redacted]d with an Estimated Date of Delivery: 11/10/22      ICD-10-CM   1. Supervision of high risk pregnancy in third trimester  O09.93     2. Dichorionic diamniotic  twin pregnancy in third trimester  O30.043     3. Marginal insertion of umbilical cord  O43.192     4. Preterm labor without delivery: s/p MGSO4 + BMZ x 2  O60.00         Meds:  Meds ordered this encounter  Medications   neomycin-polymyxin b-dexamethasone (MAXITROL) 3.5-10000-0.1 OINT    Sig: Place 1 Application into the right eye 3 (three) times daily.    Dispense:  3.5 g    Refill:  1    Orders: No orders of the defined types were placed in this encounter.    Labs/procedures today: none  Treatment Plan:  keep scheduled    Follow-up: No follow-ups on file.   Future Appointments  Date Time Provider Department Center  09/13/2022  9:15 AM [facility] - [facility] [place] [facility] None  09/13/2022 10:50 AM Cheral Marker, CNM [facility]-FT [facility]TO[facility]YN  09/29/2022  9:10 AM Myna Hidalgo, DO [facility]FT [facility]TO[facility]YN  10/11/2022  9:15 AM [facility] - [facility]TOBGYN [place] [facility] None  10/11/2022 10:50 AM Lazaro Arms, MD [facility]FT FTOBGYN   10/18/2022  9:15 AM [facility] - [facility]TO[facility]YN [place] [facility] None  10/18/2022 10:50 AM Lazaro Arms, MD [facility] FTO[facility]YN  10/21/2022  9:50 AM [facility]-FTOBGYN NURSE [facility]FT [facility]TOBGYN  10/25/2022  9:15 AM [facility] - [facility]TO[facility]YN [place] [facility] None  10/25/2022 10:50 AM Lazaro Arms, MD [facility]FT FTOBGYN  10/28/2022  9:50 AM [facility]-FTOBGYN NURSE [facility]-FT FTOBGYN  03/29/2023  1:00 PM Anabel Halon, MD R[facility]-RPC RPC    No orders of the defined types were placed in this encounter.  Lazaro Arms  Attending Physician for the Center for [facility] 09/07/2022 4:01 PM

## 2022-09-08 ENCOUNTER — Encounter: Payer: Self-pay | Admitting: Obstetrics & Gynecology

## 2022-09-12 ENCOUNTER — Other Ambulatory Visit: Payer: Self-pay | Admitting: Women's Health

## 2022-09-12 DIAGNOSIS — O0992 Supervision of high risk pregnancy, unspecified, second trimester: Secondary | ICD-10-CM

## 2022-09-12 DIAGNOSIS — O30042 Twin pregnancy, dichorionic/diamniotic, second trimester: Secondary | ICD-10-CM

## 2022-09-13 ENCOUNTER — Encounter: Payer: Self-pay | Admitting: Women's Health

## 2022-09-13 ENCOUNTER — Ambulatory Visit (INDEPENDENT_AMBULATORY_CARE_PROVIDER_SITE_OTHER): Payer: Medicaid Other

## 2022-09-13 ENCOUNTER — Ambulatory Visit (INDEPENDENT_AMBULATORY_CARE_PROVIDER_SITE_OTHER): Payer: Medicaid Other | Admitting: Women's Health

## 2022-09-13 VITALS — BP 118/71 | HR 98 | Wt 238.0 lb

## 2022-09-13 DIAGNOSIS — O0993 Supervision of high risk pregnancy, unspecified, third trimester: Secondary | ICD-10-CM

## 2022-09-13 DIAGNOSIS — Z3A31 31 weeks gestation of pregnancy: Secondary | ICD-10-CM

## 2022-09-13 DIAGNOSIS — O0992 Supervision of high risk pregnancy, unspecified, second trimester: Secondary | ICD-10-CM | POA: Diagnosis not present

## 2022-09-13 DIAGNOSIS — O99013 Anemia complicating pregnancy, third trimester: Secondary | ICD-10-CM | POA: Insufficient documentation

## 2022-09-13 DIAGNOSIS — O30042 Twin pregnancy, dichorionic/diamniotic, second trimester: Secondary | ICD-10-CM | POA: Diagnosis not present

## 2022-09-13 DIAGNOSIS — O30043 Twin pregnancy, dichorionic/diamniotic, third trimester: Secondary | ICD-10-CM

## 2022-09-13 MED ORDER — PANTOPRAZOLE SODIUM 20 MG PO TBEC: 20.0000 mg | DELAYED_RELEASE_TABLET | Freq: Every day | ORAL | 1 refills | Status: DC

## 2022-09-13 NOTE — Progress Notes (Signed)
[place] 31+5 wks,DC/DA twins BABY A: cephalic left,posterior placenta gr 3,BPP 8/8,FHR 144 bpm,SVP of fluid 5 cm,EFW 1942 g 57% BABY B: breech right,posterior fundal placenta gr 3,BPP 8/8,SVP of fluid 5.8 cm,FHR 171 bpm,EFW 1902 g 51%,discordance 2%

## 2022-09-13 NOTE — Addendum Note (Signed)
Addended by: Cheral Marker on: 09/13/2022 11:37 AM   Modules accepted: Orders

## 2022-09-13 NOTE — Progress Notes (Addendum)
HIGH-RISK PREGNANCY VISIT Patient name: Christina Conley MRN [id number]  Date of birth: [birth year] Chief Complaint:   Routine Prenatal Visit (Ultrasound today)  History of Present Illness:   Christina Conley is a 27 y.o. [phone] female at [redacted]w[redacted]d with an Estimated Date of Delivery: 11/10/22 being seen today for ongoing management of a high-risk pregnancy complicated by multiple gestation DCDA twins.    Today she reports recent hospitalization for preterm labor, 2.5/70/-2, received mag and tocolysis, d/c'd on procardia. Feels very tired, dizziness when taking procardia so only takes 1-2x/day. H/o IV Fe and blood after last twin delivery. Would like to do IV Fe during pregnancy if possible. Hgb 8.1 last week in hospital, taking Fe daily. Reflux, requests rx.   Contractions: Regular. Vag. Bleeding: None.  Movement: Present. denies leaking of fluid.      05/03/2022   10:21 AM 03/23/2022    1:07 PM 06/23/2021    3:37 PM 03/03/2021    2:39 PM 02/03/2021   10:18 AM  Depression screen PHQ 2/9  Decreased Interest 1 0 0 0 0  Down, Depressed, Hopeless 1 0 0 1 1  PHQ - 2 Score 2 0 0 1 1  Altered sleeping 0   3 3  Tired, decreased energy 2   3 3   Change in appetite 0   3 0  Feeling bad or failure about yourself  0   0 0  Trouble concentrating 0   0 0  Moving slowly or fidgety/restless 0   0 0  Suicidal thoughts 0   0 0  PHQ-9 Score 4   10 7   Difficult doing work/chores    Somewhat difficult Somewhat difficult        05/03/2022   10:21 AM 02/03/2021   10:19 AM 02/26/2020    2:56 PM 01/14/2020    9:48 AM  GAD 7 : Generalized Anxiety Score  Nervous, Anxious, on Edge 2 1 1 2   Control/stop worrying 0 1 2 1   Worry too much - different things 0 1 2 3   Trouble relaxing 0 0 3 1  Restless 0 0 0 0  Easily annoyed or irritable 3 1 2 3   Afraid - awful might happen 0 0 0 2  Total GAD 7 Score 5 4 10 12   Anxiety Difficulty  Somewhat difficult       Review of Systems:   Pertinent items are noted in  HPI Denies abnormal vaginal discharge w/ itching/odor/irritation, headaches, visual changes, shortness of breath, chest pain, abdominal pain, severe nausea/vomiting, or problems with urination or bowel movements unless otherwise stated above. Pertinent History Reviewed:  Reviewed past medical,surgical, social, obstetrical and family history.  Reviewed problem list, medications and allergies. Physical Assessment:   Vitals:   09/13/22 1041  BP: 118/71  Pulse: 98  Weight: 238 lb (108 kg)  Body mass index is 33.19 kg/m.           Physical Examination:   General appearance: alert, well appearing, and in no distress  Mental status: alert, oriented to person, place, and time  Skin: warm & dry   Extremities:      Cardiovascular: normal heart rate noted  Respiratory: normal respiratory effort, no distress  Abdomen: gravid, soft, non-tender  Pelvic: Cervical exam deferred         Fetal Surveillance Testing today: [place] 31+5 wks,DC/DA twins BABY A: cephalic left,posterior placenta gr 3,BPP 8/8,FHR 144 bpm,SVP of fluid 5 cm,EFW 1942 g 57% BABY B: breech right,posterior fundal  placenta gr 3,BPP 8/8,SVP of fluid 5.8 cm,FHR 171 bpm,EFW 1902 g 51%,discordance 2%,fundal marginal cord insertion (seen on prior US)  Chaperone: N/A    No results found for this or any previous visit (from the past 24 hour(s)).  Assessment & Plan:  High-risk pregnancy: [id number] at [redacted]w[redacted]d with an Estimated Date of Delivery: 11/10/22   1) DCDA twins, 2% discrepancy today, vtx/breech  2) Recent hospitalization for PTL, taking procardia 1-2x/day instead of TID b/c makes her dizzy. Reviewed ptl s/s, reasons to seek care  3) Anemia w/ fatigue and dizziness> taking Fe daily, hgb 8.1 on 7/14. H/O IV Fe and blood transfusion last delivery, wants IV Fe now. Orders placed for Venofer  4) Baby B w/ velamentous insertion of cord  5) Increased r/f OSB/elevated AFP> declined MFM referral  6) Reflux> rx protonix  Meds: No orders  of the defined types were placed in this encounter.   Labs/procedures today: U/S and declines tdap today, plans next visit  Treatment Plan:  EFW q 4wks    2x/wk nst or weekly bpp @ 36wks    Deliver @ 38wks:____   Reviewed: Preterm labor symptoms and general obstetric precautions including but not limited to vaginal bleeding, contractions, leaking of fluid and fetal movement were reviewed in detail with the patient.  All questions were answered. Does have home bp cuff. Office bp cuff given: not applicable. Check bp weekly, let us know if consistently >140 and/or >90.  Follow-up: Return for As scheduled.   Future Appointments  Date Time Provider Department Center  09/29/2022  9:10 AM Myna Hidalgo, DO [facility]FT [facility]TO[facility]YN  10/11/2022  9:15 AM [facility] - [facility]YN [place] [facility] None  10/11/2022 10:50 AM Lazaro Arms, MD [facility]TO[facility]YN  10/18/2022  9:15 AM [facility] - [facility]YN [place] [facility] None  10/18/2022 10:50 AM Lazaro Arms, MD [facility]TO[facility]YN  10/21/2022  9:50 AM [facility]-FTOBGYN NURSE [facility]TO[facility]YN  10/25/2022  9:15 AM [facility] - [facility] [place] [facility] None  10/25/2022 10:50 AM Lazaro Arms, MD [facility]TO[facility]  10/28/2022  9:50 AM [facility]FTOBGYN NURSE [facility]  03/29/2023  1:00 PM Anabel Halon, MD RPC-RPC RPC    No orders of the defined types were placed in this encounter.  Cheral Marker CNM, [facility] 09/13/2022 11:20 AM

## 2022-09-13 NOTE — Patient Instructions (Signed)
Christina Conley, thank you for choosing our office today! We appreciate the opportunity to meet your healthcare needs. You may receive a short survey by mail, e-mail, or through [employer]. If you are happy with your care we would appreciate if you could take just a few minutes to complete the survey questions. We read all of your comments and take your feedback very seriously. Thank you again for choosing our office.  Center for [employer] Team at [facility]  [facility] ([address] [place], [place] [address]) Entrance C, located off of [address] Free 24/7 valet parking   CLASSES: Go to [email or web address] to register for classes (childbirth, breastfeeding, waterbirth, infant CPR, daddy bootcamp, etc.)  Call the office [phone]) or go to [facility] if: You begin to have strong, frequent contractions Your water breaks.  Sometimes it is a big gush of fluid, sometimes it is just a trickle that keeps getting your panties wet or running down your legs You have vaginal bleeding.  It is normal to have a small amount of spotting if your cervix was checked.  You don't feel your baby moving like normal.  If you don't, get you something to eat and drink and lay down and focus on feeling your baby move.   If your baby is still not moving like normal, you should call the office or go to [facility].  Call the office [phone]) or go to [facility] hospital for these signs of pre-eclampsia: Severe headache that does not go away with Tylenol Visual changes- seeing spots, double, blurred vision Pain under your right breast or upper abdomen that does not go away with Tums or heartburn medicine Nausea and/or vomiting Severe swelling in your hands, feet, and face   Tdap Vaccine It is recommended that you get the Tdap vaccine during the third trimester of EACH pregnancy to help protect your baby from getting pertussis (whooping cough) 27-36 weeks is the BEST time to do  this so that you can pass the protection on to your baby. During pregnancy is better than after pregnancy, but if you are unable to get it during pregnancy it will be offered at the hospital.  You can get this vaccine with [place], at the health department, your family doctor, or some local pharmacies Everyone who will be around your baby should also be up-to-date on their vaccines before the baby comes. Adults (who are not pregnant) only need 1 dose of Tdap during adulthood.   [facility] Pediatricians/Family Doctors [facility]): [address] Colette Ribas, [phone]           [facility]: [address], [phone]                [facility]): [address], [phone] (call to ask if accepting patients) [facility] Department: [address], [place], [phone]    [facility] Pediatricians/Family Doctors [facility]): [phone] [address], [phone] [facility]: [address] [place], [phone] [facility]: [address], [phone]  [facility] Doctors  [facility]): [phone] [facility]: [address] [phone]   [facility] Doctors [facility]: [address], [phone]  [facility] Family Doctors  [facility]: [phone], [phone]  Home Blood Pressure Monitoring for Patients   Your provider has recommended that you check your  blood pressure (BP) at least once a week at home. If you do not have a blood pressure cuff at home, one will be provided for you. Contact your provider if you have not received your monitor within 1 week.   Helpful Tips for Accurate Home Blood Pressure Checks  Don't smoke, exercise, or drink caffeine 30 minutes before checking your BP Use the restroom before checking your BP (a full bladder can raise your  pressure) Relax in a comfortable upright chair Feet on the ground Left arm resting comfortably on a flat surface at the level of your heart Legs uncrossed Back supported Sit quietly and don't talk Place the cuff on your bare arm Adjust snuggly, so that only two fingertips can fit between your skin and the top of the cuff Check 2 readings separated by at least one minute Keep a log of your BP readings For a visual, please reference this diagram: http://ccnc.care/bpdiagram  Provider Name: [facility]     Phone: [phone]  Zone 1: ALL CLEAR  Continue to monitor your symptoms:  BP reading is less than 140 (top number) or less than 90 (bottom number)  No right upper stomach pain No headaches or seeing spots No feeling nauseated or throwing up No swelling in face and hands  Zone 2: CAUTION Call your doctor's office for any of the following:  BP reading is greater than 140 (top number) or greater than 90 (bottom number)  Stomach pain under your ribs in the middle or right side Headaches or seeing spots Feeling nauseated or throwing up Swelling in face and hands  Zone 3: EMERGENCY  Seek immediate medical care if you have any of the following:  BP reading is greater than160 (top number) or greater than 110 (bottom number) Severe headaches not improving with Tylenol Serious difficulty catching your breath Any worsening symptoms from Zone 2  Preterm Labor and Birth Information  The normal length of a pregnancy is 39-41 weeks. Preterm labor is when labor starts before 37 completed weeks of pregnancy. What are the risk factors for preterm labor? Preterm labor is more likely to occur in women who: Have certain infections during pregnancy such as a bladder infection, sexually transmitted infection, or infection inside the uterus (chorioamnionitis). Have a shorter-than-normal cervix. Have gone into preterm labor before. Have had surgery on their cervix. Are younger than age 39  or older than age 40. Are African American. Are pregnant with twins or multiple babies (multiple gestation). Take street drugs or smoke while pregnant. Do not gain enough weight while pregnant. Became pregnant shortly after having been pregnant. What are the symptoms of preterm labor? Symptoms of preterm labor include: Cramps similar to those that can happen during a menstrual period. The cramps may happen with diarrhea. Pain in the abdomen or lower back. Regular uterine contractions that may feel like tightening of the abdomen. A feeling of increased pressure in the pelvis. Increased watery or bloody mucus discharge from the vagina. Water breaking (ruptured amniotic sac). Why is it important to recognize signs of preterm labor? It is important to recognize signs of preterm labor because babies who are born prematurely may not be fully developed. This can put them at an increased risk for: Long-term (chronic) heart and lung problems. Difficulty immediately after birth with regulating body systems, including blood sugar, body temperature, heart rate, and breathing rate. Bleeding in the brain. Cerebral palsy. Learning difficulties. Death. These risks are highest for babies who are born before 34 weeks  of pregnancy. How is preterm labor treated? Treatment depends on the length of your pregnancy, your condition, and the health of your baby. It may involve: Having a stitch (suture) placed in your cervix to prevent your cervix from opening too early (cerclage). Taking or being given medicines, such as: Hormone medicines. These may be given early in pregnancy to help support the pregnancy. Medicine to stop contractions. Medicines to help mature the baby's lungs. These may be prescribed if the risk of delivery is high. Medicines to prevent your baby from developing cerebral palsy. If the labor happens before 34 weeks of pregnancy, you may need to stay in the hospital. What should I do if I  think I am in preterm labor? If you think that you are going into preterm labor, call your health care provider right away. How can I prevent preterm labor in future pregnancies? To increase your chance of having a full-term pregnancy: Do not use any tobacco products, such as cigarettes, chewing tobacco, and e-cigarettes. If you need help quitting, ask your health care provider. Do not use street drugs or medicines that have not been prescribed to you during your pregnancy. Talk with your health care provider before taking any herbal supplements, even if you have been taking them regularly. Make sure you gain a healthy amount of weight during your pregnancy. Watch for infection. If you think that you might have an infection, get it checked right away. Make sure to tell your health care provider if you have gone into preterm labor before. This information is not intended to replace advice given to you by your health care provider. Make sure you discuss any questions you have with your health care provider. Document Revised: 06/01/2018 Document Reviewed: 07/01/2015 Elsevier Patient Education  2020 [employer].

## 2022-09-13 NOTE — Addendum Note (Signed)
Addended by: Cheral Marker on: 09/13/2022 02:01 PM   Modules accepted: Orders

## 2022-09-14 ENCOUNTER — Inpatient Hospital Stay (HOSPITAL_COMMUNITY)
Admission: AD | Admit: 2022-09-14 | Discharge: 2022-09-14 | Disposition: A | Discharge status: Home / Self Care | Payer: Medicaid Other | Attending: Obstetrics & Gynecology | Admitting: Obstetrics & Gynecology

## 2022-09-14 ENCOUNTER — Encounter (HOSPITAL_COMMUNITY): Payer: Self-pay | Admitting: Obstetrics & Gynecology

## 2022-09-14 ENCOUNTER — Telehealth: Payer: Self-pay | Admitting: *Deleted

## 2022-09-14 DIAGNOSIS — Z880 Allergy status to penicillin: Secondary | ICD-10-CM | POA: Insufficient documentation

## 2022-09-14 DIAGNOSIS — O30043 Twin pregnancy, dichorionic/diamniotic, third trimester: Secondary | ICD-10-CM | POA: Insufficient documentation

## 2022-09-14 DIAGNOSIS — Z87891 Personal history of nicotine dependence: Secondary | ICD-10-CM | POA: Diagnosis not present

## 2022-09-14 DIAGNOSIS — Z3A31 31 weeks gestation of pregnancy: Secondary | ICD-10-CM | POA: Diagnosis not present

## 2022-09-14 DIAGNOSIS — R1030 Lower abdominal pain, unspecified: Secondary | ICD-10-CM | POA: Insufficient documentation

## 2022-09-14 DIAGNOSIS — O4700 False labor before 37 completed weeks of gestation, unspecified trimester: Secondary | ICD-10-CM

## 2022-09-14 LAB — URINALYSIS, ROUTINE W REFLEX MICROSCOPIC
Bilirubin Urine: NEGATIVE
Glucose, UA: NEGATIVE mg/dL
Hgb urine dipstick: NEGATIVE
Ketones, ur: NEGATIVE mg/dL
Nitrite: NEGATIVE
Protein, ur: NEGATIVE mg/dL
Specific Gravity, Urine: 1.012 (ref 1.005–1.030)
pH: 6 (ref 5.0–8.0)

## 2022-09-14 NOTE — MAU Provider Note (Signed)
Chief Complaint:  Contractions   Event Date/Time   First Provider Initiated Contact with Patient 09/14/22 1823      HPI: Christina Conley is a 27 y.o. [id number] at [redacted]w[redacted]d by early ultrasound who presents to maternity admissions reporting onset of cramping after her ultrasound at [facility] today. She reports the pain is low in her abdomen, intermittent, and irregular. She was recently admitted for preterm labor on  09/03/22 and is taking Procardia PRN. It did not make the cramping go away today so she came in for evaluation.   HPI  Past Medical History: Past Medical History:  Diagnosis Date   Acute blood loss anemia 08/12/2020   EBL with delivery PO iron, IV venofer administered postpartum 1 U pRBCs Patient asymptomatic   Asthma    child.  No meds > 2 years   Environmental allergies    Gonorrhea 08/2015   Irregular intermenstrual bleeding 05/27/2015    Past obstetric history: OB History  Gravida Para Term Preterm AB Living  4 2 1 1 1 3   SAB IAB Ectopic Multiple Live Births  1     1 3     # Outcome Date GA Lbr Len/2nd Weight Sex Type Anes PTL Lv  4 Current           3A Preterm 08/11/20 [redacted]w[redacted]d 02:04 / 00:27 2645 g M Vag-Spont EPI  LIV  3B Preterm 08/11/20 [redacted]w[redacted]d 02:04 / 00:36 2449 g M Vag-Spont EPI  LIV  2 Term 07/10/14 [redacted]w[redacted]d 10:07 / 00:18 3505 g F Vag-Spont EPI  LIV  1 SAB             Past Surgical History: Past Surgical History:  Procedure Laterality Date   NO PAST SURGERIES      Family History: Family History  Problem Relation Age of Onset   Crohn's disease Mother    Other Father        problems with intestines   Other Son        absent right testicle   Hypertension Maternal Grandmother    Miscarriages / Stillbirths Maternal Grandmother    Hypertension Maternal Grandfather    Hyperlipidemia Maternal Grandfather    Crohn's disease Paternal Grandmother     Social History: Social History   Tobacco Use   Smoking status: Former    Current packs/day: 0.25    Average  packs/day: 0.3 packs/day for 0.5 years (0.1 ttl pk-yrs)    Types: Cigarettes   Smokeless tobacco: Never  Vaping Use   Vaping status: Never Used  Substance Use Topics   Alcohol use: Not Currently    Comment: occ   Drug use: Not Currently    Types: Marijuana    Allergies:  Allergies  Allergen Reactions   Amoxicillin Hives   Ampicillin Hives   Penicillins Hives    Has patient had a PCN reaction causing immediate rash, facial/tongue/throat swelling, SOB or lightheadedness with hypotension: Yes Has patient had a PCN reaction causing severe rash involving mucus membranes or skin necrosis: No Has patient had a PCN reaction that required hospitalization No Has patient had a PCN reaction occurring within the last 10 years: unknown If all of the above answers are "NO", then may proceed with Cephalosporin use.     Meds:  No medications prior to admission.    ROS:  Review of Systems  Constitutional:  Negative for chills, fatigue and fever.  Eyes:  Negative for visual disturbance.  Respiratory:  Negative for shortness of breath.  Cardiovascular:  Negative for chest pain.  Gastrointestinal:  Positive for abdominal pain. Negative for nausea and vomiting.  Genitourinary:  Negative for difficulty urinating, dysuria, flank pain, pelvic pain, vaginal bleeding, vaginal discharge and vaginal pain.  Neurological:  Negative for dizziness and headaches.  Psychiatric/Behavioral: Negative.       I have reviewed patient's Past Medical Hx, Surgical Hx, Family Hx, Social Hx, medications and allergies.   Physical Exam  Patient Vitals for the past 24 hrs:  BP Temp Pulse Resp SpO2  09/14/22 1932 107/63 -- 97 -- --  09/14/22 1835 -- -- -- -- 98 %  09/14/22 1830 -- -- -- -- 98 %  09/14/22 1820 -- -- -- -- 99 %  09/14/22 1815 -- -- -- -- 99 %  09/14/22 1800 -- -- -- -- 99 %  09/14/22 1755 -- -- -- -- 98 %  09/14/22 1750 -- -- -- -- 98 %  09/14/22 1742 103/65 -- (!) 113 -- --  09/14/22 1728  96/67 98.2 F (36.8 C) (!) 119 18 --   Constitutional: Well-developed, well-nourished female in no acute distress.  Cardiovascular: normal rate Respiratory: normal effort GI: Abd soft, non-tender, gravid appropriate for gestational age.  MS: Extremities nontender, no edema, normal ROM Neurologic: Alert and oriented x 4.  GU: Neg CVAT.  PELVIC EXAM: Cervix pink, visually closed, without lesion, scant white creamy discharge, vaginal walls and external genitalia normal Bimanual exam: Cervix 0/long/high, firm, anterior, neg CMT, uterus nontender, nonenlarged, adnexa without tenderness, enlargement, or mass  Dilation: 2.5 Effacement (%): Thick Exam by:: L Leftwich-Kirby CNM  FHT:  Baby A: Baseline 145 , moderate variability, accelerations present, no decelerations          Baby B: Baseline 145 , moderate variability, accelerations present, no decelerations Contractions: q 10-15 mins with irritability   Labs: No results found for this or any previous visit (from the past 24 hour(s)). --/--/O POS (07/13 1518)  Imaging:   MAU Course/MDM: Orders Placed This Encounter  Procedures   Urinalysis, Routine w reflex microscopic -Urine, Clean Catch   Discharge patient    No orders of the defined types were placed in this encounter.    NST reviewed and reactive x 2 Cervix 2 and thick, unchanged from previous admission. Pt given PO fluids and after ~ 1 hour reported cramping resolved Contractions rare on toco D/C home with PTL precautions Pt to keep scheduled appts at [facility]   Assessment: 1. Dichorionic diamniotic twin pregnancy in third trimester   2. Preterm uterine contractions   3. [redacted] weeks gestation of pregnancy     Plan: Discharge home Labor precautions and fetal kick counts  Follow-up Information     [facility] for [facility] at [facility] Follow up.   Specialty: Obstetrics and Gynecology Why: As scheduled Contact information: [address] [place] [address] [phone]        [facility] Follow up.   Specialty: Obstetrics and Gynecology Why: As needed for emergencies Contact information: [address] [place] [address] [phone]               Allergies as of 09/14/2022       Reactions   Amoxicillin Hives   Ampicillin Hives   Penicillins Hives   Has patient had a PCN reaction causing immediate rash, facial/tongue/throat swelling, SOB or lightheadedness with hypotension: Yes Has patient had a PCN reaction causing severe rash involving mucus  membranes or skin necrosis: No Has patient had a PCN reaction that required hospitalization No Has patient had a PCN reaction occurring within the last 10 years: unknown If all of the above answers are "NO", then may proceed with Cephalosporin use.        Medication List     TAKE these medications    aspirin EC 81 MG tablet Take 1 tablet (81 mg total) by mouth daily. Swallow whole.   Blood Pressure Monitor Misc For regular home bp monitoring during pregnancy   ferrous sulfate 325 (65 FE) MG EC tablet Take 325 mg by mouth 3 (three) times daily with meals.   metroNIDAZOLE 500 MG tablet Commonly known as: FLAGYL Take 1 tablet (500 mg total) by mouth 2 (two) times daily.   neomycin-polymyxin b-dexamethasone 3.5-10000-0.1 Oint Commonly known as: MAXITROL Place 1 Application into the right eye 3 (three) times daily.   NIFEdipine 10 MG capsule Commonly known as: Procardia Take 1 capsule (10 mg total) by mouth 3 (three) times daily.   pantoprazole 20 MG tablet Commonly known as: Protonix Take 1 tablet (20 mg total) by mouth daily.   prenatal vitamin w/FE, FA 27-1 MG Tabs tablet Take 1 tablet by mouth daily at 12 noon.        Sharen Counter Certified Nurse-Midwife 09/14/2022 7:36 PM

## 2022-09-14 NOTE — Telephone Encounter (Signed)
Patient called with complaints of contractions since yesterday afternoon.  States she continued to have contractions throughout the night and seems to feel them more despite taking Procardia.  Denies bleeding but does have an increase in vaginal pressure. Recently went to MAU for contractions but didn't really feel them but was found to be in PTL.  Advised to go to MAU for eval due to recent PTL and increase in pressure.  Pt verbalized understanding.

## 2022-09-14 NOTE — Discharge Instructions (Signed)
Reasons to return to MAU at [facility]:  Since you are preterm, return to MAU if:  1.  Contractions are 10 minutes apart or less and they becoming more uncomfortable or painful over time 2.  You have a large gush of fluid, or a trickle of fluid that will not stop and you have to wear a pad 3.  You have bleeding that is bright red, heavier than spotting--like menstrual bleeding (spotting can be normal in early labor or after a check of your cervix) 4.  You do not feel the baby moving like he/she normally does  

## 2022-09-25 ENCOUNTER — Inpatient Hospital Stay (HOSPITAL_COMMUNITY)
Admission: AD | Admit: 2022-09-25 | Discharge: 2022-09-25 | Disposition: A | Discharge status: Home / Self Care | Payer: Medicaid Other | Attending: Obstetrics & Gynecology | Admitting: Obstetrics & Gynecology

## 2022-09-25 ENCOUNTER — Encounter (HOSPITAL_COMMUNITY): Payer: Self-pay | Admitting: Obstetrics & Gynecology

## 2022-09-25 DIAGNOSIS — R112 Nausea with vomiting, unspecified: Secondary | ICD-10-CM

## 2022-09-25 DIAGNOSIS — Z3A33 33 weeks gestation of pregnancy: Secondary | ICD-10-CM | POA: Diagnosis not present

## 2022-09-25 DIAGNOSIS — Z87891 Personal history of nicotine dependence: Secondary | ICD-10-CM | POA: Insufficient documentation

## 2022-09-25 DIAGNOSIS — O212 Late vomiting of pregnancy: Secondary | ICD-10-CM | POA: Insufficient documentation

## 2022-09-25 DIAGNOSIS — E860 Dehydration: Secondary | ICD-10-CM

## 2022-09-25 DIAGNOSIS — O26893 Other specified pregnancy related conditions, third trimester: Secondary | ICD-10-CM | POA: Diagnosis not present

## 2022-09-25 DIAGNOSIS — O30043 Twin pregnancy, dichorionic/diamniotic, third trimester: Secondary | ICD-10-CM

## 2022-09-25 DIAGNOSIS — O99891 Other specified diseases and conditions complicating pregnancy: Secondary | ICD-10-CM | POA: Insufficient documentation

## 2022-09-25 DIAGNOSIS — R109 Unspecified abdominal pain: Secondary | ICD-10-CM | POA: Diagnosis present

## 2022-09-25 DIAGNOSIS — R197 Diarrhea, unspecified: Secondary | ICD-10-CM

## 2022-09-25 DIAGNOSIS — O4703 False labor before 37 completed weeks of gestation, third trimester: Secondary | ICD-10-CM

## 2022-09-25 LAB — URINALYSIS, ROUTINE W REFLEX MICROSCOPIC
Bilirubin Urine: NEGATIVE
Glucose, UA: NEGATIVE mg/dL
Hgb urine dipstick: NEGATIVE
Ketones, ur: 80 mg/dL — AB
Nitrite: NEGATIVE
Protein, ur: 100 mg/dL — AB
Specific Gravity, Urine: 1.024 (ref 1.005–1.030)
Squamous Epithelial / HPF: >50 /HPF (ref 0–5)
pH: 5 (ref 5.0–8.0)

## 2022-09-25 MED ORDER — ONDANSETRON HCL 4 MG/2ML IJ SOLN
4.0000 mg | Freq: Once | INTRAMUSCULAR | Status: DC
  Filled 2022-09-25: qty 2

## 2022-09-25 MED ORDER — LACTATED RINGERS IV BOLUS
1000.0000 mL | Freq: Once | INTRAVENOUS | Status: AC
  Administered 2022-09-25: 1000 mL via INTRAVENOUS

## 2022-09-25 MED ORDER — FAMOTIDINE IN NACL 20-0.9 MG/50ML-% IV SOLN
20.0000 mg | Freq: Once | INTRAVENOUS | Status: AC
  Administered 2022-09-25: 20 mg via INTRAVENOUS
  Filled 2022-09-25: qty 50

## 2022-09-25 NOTE — MAU Provider Note (Signed)
Chief Complaint:  Abdominal Pain and Contractions  HPI   Event Date/Time   First Provider Initiated Contact with Patient 09/25/22 2037     Christina Conley is a 27 y.o. [id number] at [redacted]w[redacted]d who presents to maternity admissions reporting vomiting since 1300 x5 + softer stools. Ate salad yesterday for lunch and noodles for dinner but hasn't been able to eat anything today due to nausea then the vomiting. Has had trouble with preterm contractions with this di-di twin pregnancy and was told if she begins dilating again, we will not stop labor - she has already had two doses of betamethasone.   No other physical complaints.   Pregnancy Course: Receives prenatal care at [facility], records reviewed.  Past Medical History:  Diagnosis Date   Acute blood loss anemia 08/12/2020   EBL with delivery PO iron, IV venofer administered postpartum 1 U pRBCs Patient asymptomatic   Asthma    child.  No meds > 2 years   Environmental allergies    Gonorrhea 08/2015   Irregular intermenstrual bleeding 05/27/2015   OB History  Gravida Para Term Preterm AB Living  4 2 1 1 1 3   SAB IAB Ectopic Multiple Live Births  1     1 3     # Outcome Date GA Lbr Len/2nd Weight Sex Type Anes PTL Lv  4 Current           3A Preterm 08/11/20 [redacted]w[redacted]d 02:04 / 00:27 5 lb 13.3 oz (2.645 kg) M Vag-Spont EPI  LIV  3B Preterm 08/11/20 [redacted]w[redacted]d 02:04 / 00:36 5 lb 6.4 oz (2.449 kg) M Vag-Spont EPI  LIV  2 Term 07/10/14 [redacted]w[redacted]d 10:07 / 00:18 7 lb 11.6 oz (3.505 kg) F Vag-Spont EPI  LIV  1 SAB            Past Surgical History:  Procedure Laterality Date   NO PAST SURGERIES     Family History  Problem Relation Age of Onset   Crohn's disease Mother    Other Father        problems with intestines   Other Son        absent right testicle   Hypertension Maternal Grandmother    Miscarriages / Stillbirths Maternal Grandmother    Hypertension Maternal Grandfather    Hyperlipidemia Maternal Grandfather    Crohn's disease Paternal  Grandmother    Social History   Tobacco Use   Smoking status: Former    Current packs/day: 0.25    Average packs/day: 0.3 packs/day for 0.5 years (0.1 ttl pk-yrs)    Types: Cigarettes   Smokeless tobacco: Never  Vaping Use   Vaping status: Never Used  Substance Use Topics   Alcohol use: Not Currently    Comment: occ   Drug use: Not Currently    Types: Marijuana   Allergies  Allergen Reactions   Amoxicillin Hives   Ampicillin Hives   Penicillins Hives    Has patient had a PCN reaction causing immediate rash, facial/tongue/throat swelling, SOB or lightheadedness with hypotension: Yes Has patient had a PCN reaction causing severe rash involving mucus membranes or skin necrosis: No Has patient had a PCN reaction that required hospitalization No Has patient had a PCN reaction occurring within the last 10 years: unknown If all of the above answers are "NO", then may proceed with Cephalosporin use.    No medications prior to admission.   I have reviewed patient's Past Medical Hx, Surgical Hx, Family Hx, Social Hx, medications and allergies.  ROS  Pertinent items noted in HPI and remainder of comprehensive ROS otherwise negative.   PHYSICAL EXAM  Patient Vitals for the past 24 hrs:  BP Temp Pulse Resp SpO2 Height Weight  09/25/22 2315 109/75 -- (!) 103 18 -- -- --  09/25/22 2255 -- -- -- -- 99 % -- --  09/25/22 2225 -- -- -- -- 100 % -- --  09/25/22 2220 -- -- -- -- 100 % -- --  09/25/22 2215 -- -- -- -- 100 % -- --  09/25/22 2210 -- -- -- -- 100 % -- --  09/25/22 2030 113/71 -- (!) 106 -- -- -- --  09/25/22 2025 -- -- -- -- 98 % -- --  09/25/22 2020 -- -- -- -- 98 % -- --  09/25/22 1944 112/68 98.2 F (36.8 C) (!) 107 18 99 % 5\' 11"  (1.803 m) 237 lb 11.2 oz (107.8 kg)   Constitutional: Well-developed, well-nourished female in no acute distress.  Cardiovascular: normal rate & rhythm, warm and well-perfused Respiratory: normal effort, no problems with respiration  noted GI: Abd soft, non-tender, non-distended MS: Extremities nontender, no edema, normal ROM Neurologic: Alert and oriented x 4.  GU: no CVA tenderness Pelvic: NEFG, physiologic discharge, no blood, cervix clean.   Dilation: 2.5 Effacement (%): Thick Cervical Position: Posterior Station: -2 Presentation: Vertex Exam by:: Daine Croker, CNM.  Fetal Tracing: reactive x2 Baseline: 125/130 Variability: moderate Accelerations: 15x15 Decelerations: none Toco: q2-60min initially, spaced out to irregular   Labs: Results for orders placed or performed during the hospital encounter of 09/25/22 (from the past 24 hour(s))  Urinalysis, Routine w reflex microscopic -Urine, Clean Catch     Status: Abnormal   Collection Time: 09/25/22  7:39 PM  Result Value Ref Range   Color, Urine AMBER (A) YELLOW   APPearance CLOUDY (A) CLEAR   Specific Gravity, Urine 1.024 1.005 - 1.030   pH 5.0 5.0 - 8.0   Glucose, UA NEGATIVE NEGATIVE mg/dL   Hgb urine dipstick NEGATIVE NEGATIVE   Bilirubin Urine NEGATIVE NEGATIVE   Ketones, ur 80 (A) NEGATIVE mg/dL   Protein, ur 130 (A) NEGATIVE mg/dL   Nitrite NEGATIVE NEGATIVE   Leukocytes,Ua MODERATE (A) NEGATIVE   RBC / HPF 6-10 0 - 5 RBC/hpf   WBC, UA 21-50 0 - 5 WBC/hpf   Bacteria, UA FEW (A) NONE SEEN   Squamous Epithelial / HPF >50 0 - 5 /HPF   Mucus PRESENT    Imaging:  No results found.  MDM & MAU COURSE  MDM: Moderate  MAU Course: Orders Placed This Encounter  Procedures   Urinalysis, Routine w reflex microscopic -Urine, Clean Catch   Discharge patient   Meds ordered this encounter  Medications   lactated ringers bolus 1,000 mL   DISCONTD: ondansetron (ZOFRAN) injection 4 mg   famotidine (PEPCID) IVPB 20 mg premix   lactated ringers bolus 1,000 mL   Initially ordered IV zofran but pt endorsed only bowel movements and gastric burning left, was able to keep down ice chips and [employer] while here.  D/c'd zofran and ordered LR bolus x2 (UA =  dehydration) and IV pepcid. Pt unable to take procardia today but declined dose here as well as terbutaline as she does not like the heart racing side effect.   Cervix unchanged with increased contractions which appear to be more uterine irritability from increased gastric motility and dehydration. Calmed considerably with 2L of LR. Pt stable for discharge home with strong preterm labor precautions.  ASSESSMENT  1. Nausea vomiting and diarrhea   2. Preterm uterine contractions in third trimester, antepartum   3. Dichorionic diamniotic twin pregnancy in third trimester   4. Dehydration   5. [redacted] weeks gestation of pregnancy    PLAN  Discharge home in stable condition with preterm labor precautions.     Follow-up Information     [facility] for [facility] at [facility] Follow up.   Specialty: Obstetrics and Gynecology Why: as scheduled for ongoing prenatal care Contact information: [address] [place] [address] [phone]                Allergies as of 09/25/2022       Reactions   Amoxicillin Hives   Ampicillin Hives   Penicillins Hives   Has patient had a PCN reaction causing immediate rash, facial/tongue/throat swelling, SOB or lightheadedness with hypotension: Yes Has patient had a PCN reaction causing severe rash involving mucus membranes or skin necrosis: No Has patient had a PCN reaction that required hospitalization No Has patient had a PCN reaction occurring within the last 10 years: unknown If all of the above answers are "NO", then may proceed with Cephalosporin use.        Medication List     TAKE these medications    aspirin EC 81 MG tablet Take 1 tablet (81 mg total) by mouth daily. Swallow whole.   Blood Pressure Monitor Misc For regular home bp monitoring during pregnancy   ferrous sulfate 325 (65 FE) MG EC tablet Take 325 mg by mouth 3 (three) times daily with meals.   metroNIDAZOLE 500 MG  tablet Commonly known as: FLAGYL Take 1 tablet (500 mg total) by mouth 2 (two) times daily.   neomycin-polymyxin b-dexamethasone 3.5-10000-0.1 Oint Commonly known as: MAXITROL Place 1 Application into the right eye 3 (three) times daily.   NIFEdipine 10 MG capsule Commonly known as: Procardia Take 1 capsule (10 mg total) by mouth 3 (three) times daily.   pantoprazole 20 MG tablet Commonly known as: Protonix Take 1 tablet (20 mg total) by mouth daily.   prenatal vitamin w/FE, FA 27-1 MG Tabs tablet Take 1 tablet by mouth daily at 12 noon.        Edd Arbour, CNM, MSN, IBCLC Certified Nurse Midwife, [facility]

## 2022-09-25 NOTE — MAU Provider Note (Incomplete)
Chief Complaint:  No chief complaint on file.   HPI   Event Date/Time   First Provider Initiated Contact with Patient 09/25/22 2037      Christina Conley is a 27 y.o. [id number] at [redacted]w[redacted]d who presents to maternity admissions reporting ***.   Pregnancy Course: ***  Past Medical History:  Diagnosis Date  . Acute blood loss anemia 08/12/2020   EBL with delivery PO iron, IV venofer administered postpartum 1 U pRBCs Patient asymptomatic  . Asthma    child.  No meds > 2 years  . Environmental allergies   . Gonorrhea 08/2015  . Irregular intermenstrual bleeding 05/27/2015   OB History  Gravida Para Term Preterm AB Living  4 2 1 1 1 3   SAB IAB Ectopic Multiple Live Births  1     1 3     # Outcome Date GA Lbr Len/2nd Weight Sex Type Anes PTL Lv  4 Current           3A Preterm 08/11/20 [redacted]w[redacted]d 02:04 / 00:27 5 lb 13.3 oz (2.645 kg) M Vag-Spont EPI  LIV  3B Preterm 08/11/20 [redacted]w[redacted]d 02:04 / 00:36 5 lb 6.4 oz (2.449 kg) M Vag-Spont EPI  LIV  2 Term 07/10/14 [redacted]w[redacted]d 10:07 / 00:18 7 lb 11.6 oz (3.505 kg) F Vag-Spont EPI  LIV  1 SAB            Past Surgical History:  Procedure Laterality Date  . NO PAST SURGERIES     Family History  Problem Relation Age of Onset  . Crohn's disease Mother   . Other Father        problems with intestines  . Other Son        absent right testicle  . Hypertension Maternal Grandmother   . Miscarriages / Stillbirths Maternal Grandmother   . Hypertension Maternal Grandfather   . Hyperlipidemia Maternal Grandfather   . Crohn's disease Paternal Grandmother    Social History   Tobacco Use  . Smoking status: Former    Current packs/day: 0.25    Average packs/day: 0.3 packs/day for 0.5 years (0.1 ttl pk-yrs)    Types: Cigarettes  . Smokeless tobacco: Never  Vaping Use  . Vaping status: Never Used  Substance Use Topics  . Alcohol use: Not Currently    Comment: occ  . Drug use: Not Currently    Types: Marijuana   Allergies  Allergen Reactions  .  Amoxicillin Hives  . Ampicillin Hives  . Penicillins Hives    Has patient had a PCN reaction causing immediate rash, facial/tongue/throat swelling, SOB or lightheadedness with hypotension: Yes Has patient had a PCN reaction causing severe rash involving mucus membranes or skin necrosis: No Has patient had a PCN reaction that required hospitalization No Has patient had a PCN reaction occurring within the last 10 years: unknown If all of the above answers are "NO", then may proceed with Cephalosporin use.    Medications Prior to Admission  Medication Sig Dispense Refill Last Dose  . ferrous sulfate 325 (65 FE) MG EC tablet Take 325 mg by mouth 3 (three) times daily with meals.   09/25/2022  . prenatal vitamin w/FE, FA (PRENATAL 1 + 1) 27-1 MG TABS tablet Take 1 tablet by mouth daily at 12 noon. 30 tablet 12 09/25/2022  . aspirin EC 81 MG tablet Take 1 tablet (81 mg total) by mouth daily. Swallow whole. 90 tablet 3   . Blood Pressure Monitor MISC For regular home bp monitoring  during pregnancy 1 each 0   . metroNIDAZOLE (FLAGYL) 500 MG tablet Take 1 tablet (500 mg total) by mouth 2 (two) times daily. 12 tablet 0   . neomycin-polymyxin b-dexamethasone (MAXITROL) 3.5-10000-0.1 OINT Place 1 Application into the right eye 3 (three) times daily. 3.5 g 1   . NIFEdipine (PROCARDIA) 10 MG capsule Take 1 capsule (10 mg total) by mouth 3 (three) times daily. 90 capsule 1   . pantoprazole (PROTONIX) 20 MG tablet Take 1 tablet (20 mg total) by mouth daily. 30 tablet 1     I have reviewed patient's Past Medical Hx, Surgical Hx, Family Hx, Social Hx, medications and allergies.   ROS  Pertinent items noted in HPI and remainder of comprehensive ROS otherwise negative.   PHYSICAL EXAM  Patient Vitals for the past 24 hrs:  BP Temp Pulse Resp SpO2 Height Weight  09/25/22 1944 112/68 98.2 F (36.8 C) (!) 107 18 99 % 5\' 11"  (1.803 m) 237 lb 11.2 oz (107.8 kg)    Constitutional: Well-developed, well-nourished  female in no acute distress.  Cardiovascular: normal rate & rhythm, warm and well-perfused Respiratory: normal effort, no problems with respiration noted GI: Abd soft, non-tender, non-distended MS: Extremities nontender, no edema, normal ROM Neurologic: Alert and oriented x 4.  GU: no CVA tenderness Pelvic: NEFG, physiologic discharge, no blood, cervix clean.   Dilation: 2.5 Exam by:: Cesia Orf, CNM.  Fetal Tracing: Baseline: Variability: Accelerations:  Decelerations: Toco:    Labs: Results for orders placed or performed during the hospital encounter of 09/25/22 (from the past 24 hour(s))  Urinalysis, Routine w reflex microscopic -Urine, Clean Catch     Status: Abnormal   Collection Time: 09/25/22  7:39 PM  Result Value Ref Range   Color, Urine AMBER (A) YELLOW   APPearance CLOUDY (A) CLEAR   Specific Gravity, Urine 1.024 1.005 - 1.030   pH 5.0 5.0 - 8.0   Glucose, UA NEGATIVE NEGATIVE mg/dL   Hgb urine dipstick NEGATIVE NEGATIVE   Bilirubin Urine NEGATIVE NEGATIVE   Ketones, ur 80 (A) NEGATIVE mg/dL   Protein, ur 295 (A) NEGATIVE mg/dL   Nitrite NEGATIVE NEGATIVE   Leukocytes,Ua MODERATE (A) NEGATIVE   RBC / HPF 6-10 0 - 5 RBC/hpf   WBC, UA 21-50 0 - 5 WBC/hpf   Bacteria, UA FEW (A) NONE SEEN   Squamous Epithelial / HPF >50 0 - 5 /HPF   Mucus PRESENT     Imaging:  No results found.  MDM & MAU COURSE  MDM:  MAU Course: Orders Placed This Encounter  Procedures  . Urinalysis, Routine w reflex microscopic -Urine, Clean Catch   Meds ordered this encounter  Medications  . lactated ringers bolus 1,000 mL  . ondansetron (ZOFRAN) injection 4 mg  . famotidine (PEPCID) IVPB 20 mg premix  . lactated ringers bolus 1,000 mL    ASSESSMENT  No diagnosis found.  PLAN  Discharge home in stable condition.  ***    Allergies as of 09/25/2022       Reactions   Amoxicillin Hives   Ampicillin Hives   Penicillins Hives   Has patient had a PCN reaction causing immediate  rash, facial/tongue/throat swelling, SOB or lightheadedness with hypotension: Yes Has patient had a PCN reaction causing severe rash involving mucus membranes or skin necrosis: No Has patient had a PCN reaction that required hospitalization No Has patient had a PCN reaction occurring within the last 10 years: unknown If all of the above answers are "NO", then  may proceed with Cephalosporin use.     Med Rec must be completed prior to using this SMARTLINK***       Edd Arbour, CNM, MSN, IBCLC Certified Nurse Midwife, [facility]

## 2022-09-25 NOTE — MAU Note (Signed)
.  Christina Conley is a 27 y.o. at [redacted]w[redacted]d here in MAU reporting: CTX 1-66mins since 1500, vomitting since 1300 with 5 episodes of emesis, and soft stools. Pt reports she has not able to eat, drink some water and sprite. Pt denies taking pain or antiemetic medications. Pt report DFM today small movement. Pt denies VB, LOF, and PIH s/s.   Onset of complaint: 1300 Pain score: 6/10 Vitals:   09/25/22 1944  BP: 112/68  Pulse: (!) 107  Resp: 18  Temp: 98.2 F (36.8 C)  SpO2: 99%     FHT:145/150 Lab orders placed from triage:  UA

## 2022-09-29 ENCOUNTER — Encounter: Payer: Self-pay | Admitting: Obstetrics & Gynecology

## 2022-09-29 ENCOUNTER — Other Ambulatory Visit: Payer: Self-pay

## 2022-09-29 ENCOUNTER — Other Ambulatory Visit: Payer: Self-pay | Admitting: Obstetrics & Gynecology

## 2022-09-29 ENCOUNTER — Telehealth: Payer: Self-pay | Admitting: Pharmacy Technician

## 2022-09-29 ENCOUNTER — Ambulatory Visit (INDEPENDENT_AMBULATORY_CARE_PROVIDER_SITE_OTHER): Payer: Medicaid Other | Admitting: Obstetrics & Gynecology

## 2022-09-29 VITALS — BP 108/68 | HR 108 | Wt 243.0 lb

## 2022-09-29 DIAGNOSIS — Z23 Encounter for immunization: Secondary | ICD-10-CM

## 2022-09-29 DIAGNOSIS — O43192 Other malformation of placenta, second trimester: Secondary | ICD-10-CM

## 2022-09-29 DIAGNOSIS — O99013 Anemia complicating pregnancy, third trimester: Secondary | ICD-10-CM

## 2022-09-29 DIAGNOSIS — O30043 Twin pregnancy, dichorionic/diamniotic, third trimester: Secondary | ICD-10-CM

## 2022-09-29 DIAGNOSIS — O0993 Supervision of high risk pregnancy, unspecified, third trimester: Secondary | ICD-10-CM

## 2022-09-29 DIAGNOSIS — O43193 Other malformation of placenta, third trimester: Secondary | ICD-10-CM

## 2022-09-29 DIAGNOSIS — Z3A34 34 weeks gestation of pregnancy: Secondary | ICD-10-CM

## 2022-09-29 MED ORDER — IRON SUCROSE 500 MG IVPB - SIMPLE MED
500.0000 mg | Freq: Once | INTRAVENOUS | Status: DC
  Filled 2022-09-29: qty 275

## 2022-09-29 MED ORDER — DIPHENHYDRAMINE HCL 25 MG PO CAPS: 25.0000 mg | ORAL_CAPSULE | Freq: Once | ORAL | Status: DC

## 2022-09-29 MED ORDER — ACETAMINOPHEN 325 MG PO TABS: 650.0000 mg | ORAL_TABLET | Freq: Once | ORAL | Status: DC

## 2022-09-29 NOTE — Telephone Encounter (Signed)
Auth Submission: NO AUTH NEEDED Site of care: Site of care: [facility] Payer: [employer] Medication & CPT/J Code(s) submitted: Venofer (Iron Sucrose) J1756 Route of submission (phone, fax, portal):  Phone # Fax # Auth type: Buy/Bill PB Units/visits requested: X2 Reference number:  Approval from: 09/29/22 to 12/30/22

## 2022-09-29 NOTE — Progress Notes (Signed)
HIGH-RISK PREGNANCY VISIT Patient name: Christina Conley MRN [id number]  Date of birth: October 22, 1995 Chief Complaint:   Routine Prenatal Visit (Still having contractions)  History of Present Illness:   Christina Conley is a 27 y.o. [phone] female at [redacted]w[redacted]d with an Estimated Date of Delivery: 11/10/22 being seen today for ongoing management of a high-risk pregnancy complicated by:  -DC/DA twins -Preterm contractions  S/p BMZ  Seen at MAU several occasions- cervix 2.5cm -velamentous cord of Twin B -Anemia- []  supposed to get IV iron -GBS pos   Sometimes contractions every 3-4 min sometimes every 2 min.    Contractions: Irregular. Denies vaginal bleeding .  Movement: Present. denies leaking of fluid.      05/03/2022   10:21 AM 03/23/2022    1:07 PM 06/23/2021    3:37 PM 03/03/2021    2:39 PM 02/03/2021   10:18 AM  Depression screen PHQ 2/9  Decreased Interest 1 0 0 0 0  Down, Depressed, Hopeless 1 0 0 1 1  PHQ - 2 Score 2 0 0 1 1  Altered sleeping 0   3 3  Tired, decreased energy 2   3 3   Change in appetite 0   3 0  Feeling bad or failure about yourself  0   0 0  Trouble concentrating 0   0 0  Moving slowly or fidgety/restless 0   0 0  Suicidal thoughts 0   0 0  PHQ-9 Score 4   10 7   Difficult doing work/chores    Somewhat difficult Somewhat difficult     Current Outpatient Medications  Medication Instructions   aspirin EC 81 mg, Oral, Daily, Swallow whole.   Blood Pressure Monitor MISC For regular home bp monitoring during pregnancy   calcium carbonate (TUMS EX) 750 MG chewable tablet 1 tablet, Oral, Daily   ferrous sulfate 325 mg, Oral, 3 times daily with meals   NIFEdipine (PROCARDIA) 10 mg, Oral, 3 times daily   pantoprazole (PROTONIX) 20 mg, Oral, Daily   prenatal vitamin w/FE, FA (PRENATAL 1 + 1) 27-1 MG TABS tablet 1 tablet, Oral, Daily     Review of Systems:   Pertinent items are noted in HPI Denies abnormal vaginal discharge w/ itching/odor/irritation, headaches,  visual changes, shortness of breath, chest pain, abdominal pain, severe nausea/vomiting, or problems with urination or bowel movements unless otherwise stated above. Pertinent History Reviewed:  Reviewed past medical,surgical, social, obstetrical and family history.  Reviewed problem list, medications and allergies. Physical Assessment:   Vitals:   09/29/22 0916  BP: 108/68  Pulse: (!) 108  Weight: 243 lb (110.2 kg)  Body mass index is 33.89 kg/m.           Physical Examination:   General appearance: alert, well appearing, and in no distress  Mental status: normal mood, behavior, speech, dress, motor activity, and thought processes  Skin: warm & dry   Extremities: Edema: None    Cardiovascular: normal heart rate noted  Respiratory: normal respiratory effort, no distress  Abdomen: gravid, soft, non-tender  Pelvic: Cervical exam performed  Dilation: 2.5 Effacement (%): 50 Station: -2  Fetal Status: Fetal Heart Rate (bpm): 130/140   Movement: Present    Fetal Surveillance Testing today: doppler   Chaperone:  pt declined     No results found for this or any previous visit (from the past 24 hour(s)).   Assessment & Plan:  High-risk pregnancy: [id number] at [redacted]w[redacted]d with an Estimated Date of Delivery: 11/10/22  1) DC/DA twins -continue growth q 4wks, next scheduled 8/20 -antepartum testing @ 36wks -plan for IOL @ 38wk though suspect she may go into labor prior to that date  2) Preterm contractions -no cervical change -pt very anxious about knowing when it's time to go to hospital- lives [place]hr from [facility] []  plan for weekly visits   3) GBS positive -antibiotics in labor- lab results reviewed- sensitivities were not completed, []  plan to complete next visit   Meds: No orders of the defined types were placed in this encounter.   Labs/procedures today: none  Treatment Plan:  as outlined above  Reviewed: Preterm labor symptoms and general obstetric precautions including but not  limited to vaginal bleeding, contractions, leaking of fluid and fetal movement were reviewed in detail with the patient.  All questions were answered. Pt has home bp cuff. Check bp weekly, let us know if >140/90.   Follow-up: Return for weekly HROB visit and BPP weekly starting in 2 wks.   Future Appointments  Date Time Provider Department Center  10/06/2022 10:30 AM Myna Hidalgo, DO [facility]  10/11/2022  9:15 AM [facility] [place] [facility] None  10/11/2022 10:50 AM Lazaro Arms, MD [facility]  10/18/2022  9:15 AM [facility] [place] [facility] None  10/18/2022 10:50 AM Lazaro Arms, MD [facility]  10/21/2022  9:50 AM [facility] NURSE [facility]  10/25/2022  9:15 AM [facility] [place] [facility] None  10/25/2022 10:50 AM Lazaro Arms, MD [facility]  10/28/2022  9:50 AM [facility] NURSE [facility]  03/29/2023  1:00 PM Anabel Halon, MD [facility]    Orders Placed This Encounter  Procedures   Tdap vaccine greater than or equal to 7yo IM    Myna Hidalgo, DO Attending Obstetrician & Gynecologist, [facility] for [employer], [facility]

## 2022-10-03 ENCOUNTER — Ambulatory Visit: Payer: Medicaid Other

## 2022-10-04 ENCOUNTER — Ambulatory Visit: Payer: Medicaid Other

## 2022-10-05 ENCOUNTER — Encounter: Payer: Medicaid Other | Attending: Obstetrics & Gynecology | Admitting: Emergency Medicine

## 2022-10-05 VITALS — BP 108/71 | HR 99 | Temp 98.5°F | Resp 16

## 2022-10-05 DIAGNOSIS — Z3A00 Weeks of gestation of pregnancy not specified: Secondary | ICD-10-CM | POA: Insufficient documentation

## 2022-10-05 DIAGNOSIS — O30043 Twin pregnancy, dichorionic/diamniotic, third trimester: Secondary | ICD-10-CM | POA: Diagnosis not present

## 2022-10-05 DIAGNOSIS — O30042 Twin pregnancy, dichorionic/diamniotic, second trimester: Secondary | ICD-10-CM

## 2022-10-05 DIAGNOSIS — O99013 Anemia complicating pregnancy, third trimester: Secondary | ICD-10-CM

## 2022-10-05 MED ORDER — ACETAMINOPHEN 325 MG PO TABS
650.0000 mg | ORAL_TABLET | Freq: Once | ORAL | Status: AC
  Administered 2022-10-05: 650 mg via ORAL
  Filled 2022-10-05: qty 2

## 2022-10-05 MED ORDER — SODIUM CHLORIDE 0.9 % IV SOLN
500.0000 mg | Freq: Once | INTRAVENOUS | Status: AC
  Administered 2022-10-05: 500 mg via INTRAVENOUS
  Filled 2022-10-05: qty 25

## 2022-10-05 MED ORDER — IRON SUCROSE 500 MG IVPB - SIMPLE MED
500.0000 mg | Freq: Once | INTRAVENOUS | Status: DC
  Filled 2022-10-05: qty 275

## 2022-10-05 MED ORDER — DIPHENHYDRAMINE HCL 25 MG PO CAPS
25.0000 mg | ORAL_CAPSULE | Freq: Once | ORAL | Status: AC
  Administered 2022-10-05: 25 mg via ORAL
  Filled 2022-10-05: qty 1

## 2022-10-05 NOTE — Progress Notes (Signed)
Diagnosis: Iron Deficiency Anemia  Provider:  Myna Hidalgo DO  Procedure: IV Infusion  IV Type: Peripheral, IV Location: left wrist   Venofer (Iron Sucrose), Dose: 500 mg  Infusion Start Time: 1009  Infusion Stop Time: 1410  Post Infusion IV Care: Observation period completed and Peripheral IV Discontinued  Discharge: Condition: Good, Destination: Home . AVS Provided  Performed by:  Arrie Senate, RN

## 2022-10-06 ENCOUNTER — Other Ambulatory Visit (HOSPITAL_COMMUNITY)
Admission: RE | Admit: 2022-10-06 | Payer: Medicaid Other | Source: Ambulatory Visit | Admitting: Obstetrics & Gynecology

## 2022-10-06 ENCOUNTER — Ambulatory Visit: Payer: Medicaid Other | Admitting: Obstetrics & Gynecology

## 2022-10-06 ENCOUNTER — Encounter: Payer: Self-pay | Admitting: Obstetrics & Gynecology

## 2022-10-06 VITALS — BP 93/65 | HR 130 | Wt 243.2 lb

## 2022-10-06 DIAGNOSIS — O99013 Anemia complicating pregnancy, third trimester: Secondary | ICD-10-CM

## 2022-10-06 DIAGNOSIS — O30043 Twin pregnancy, dichorionic/diamniotic, third trimester: Secondary | ICD-10-CM | POA: Diagnosis not present

## 2022-10-06 DIAGNOSIS — Z3A35 35 weeks gestation of pregnancy: Secondary | ICD-10-CM | POA: Insufficient documentation

## 2022-10-06 DIAGNOSIS — O0993 Supervision of high risk pregnancy, unspecified, third trimester: Secondary | ICD-10-CM | POA: Diagnosis not present

## 2022-10-06 NOTE — Progress Notes (Signed)
HIGH-RISK PREGNANCY VISIT Patient name: Christina Conley MRN [id number]  Date of birth: 06/25/1995 Chief Complaint:   Routine Prenatal Visit  History of Present Illness:   Christina Conley is a 27 y.o. [id number] female at [redacted]w[redacted]d with an Estimated Date of Delivery: 11/10/22 being seen today for ongoing management of a high-risk pregnancy complicated by: -DC/DA twins -GBS positive -h/o preterm contractions- cervix 2.5/50/-3 -Anemia  Today she reports  continued pelvic pressure, denies regular contractions .   Contractions: Irregular. Vag. Bleeding: None.  Movement: Present- but not as much as previously. denies leaking of fluid.      05/03/2022   10:21 AM 03/23/2022    1:07 PM 06/23/2021    3:37 PM 03/03/2021    2:39 PM 02/03/2021   10:18 AM  Depression screen PHQ 2/9  Decreased Interest 1 0 0 0 0  Down, Depressed, Hopeless 1 0 0 1 1  PHQ - 2 Score 2 0 0 1 1  Altered sleeping 0   3 3  Tired, decreased energy 2   3 3   Change in appetite 0   3 0  Feeling bad or failure about yourself  0   0 0  Trouble concentrating 0   0 0  Moving slowly or fidgety/restless 0   0 0  Suicidal thoughts 0   0 0  PHQ-9 Score 4   10 7   Difficult doing work/chores    Somewhat difficult Somewhat difficult     Current Outpatient Medications  Medication Instructions   aspirin EC 81 mg, Oral, Daily, Swallow whole.   Blood Pressure Monitor MISC For regular home bp monitoring during pregnancy   calcium carbonate (TUMS EX) 750 MG chewable tablet 1 tablet, Oral, Daily   ferrous sulfate 325 mg, Oral, 3 times daily with meals   NIFEdipine (PROCARDIA) 10 mg, Oral, 3 times daily   pantoprazole (PROTONIX) 20 mg, Oral, Daily   prenatal vitamin w/FE, FA (PRENATAL 1 + 1) 27-1 MG TABS tablet 1 tablet, Oral, Daily     Review of Systems:   Pertinent items are noted in HPI Denies abnormal vaginal discharge w/ itching/odor/irritation, headaches, visual changes, shortness of breath, chest pain, abdominal pain, severe  nausea/vomiting, or problems with urination or bowel movements unless otherwise stated above. Pertinent History Reviewed:  Reviewed past medical,surgical, social, obstetrical and family history.  Reviewed problem list, medications and allergies. Physical Assessment:   Vitals:   10/06/22 1041  BP: 93/65  Pulse: (!) 130  Weight: 243 lb 3.2 oz (110.3 kg)  Body mass index is 33.92 kg/m.           Physical Examination:   General appearance: alert, well appearing, and in no distress  Mental status: normal mood, behavior, speech, dress, motor activity, and thought processes  Skin: warm & dry   Extremities:   no edema   Cardiovascular: normal heart rate noted  Respiratory: normal respiratory effort, no distress  Abdomen: gravid, soft, non-tender  Pelvic: Cervical exam performed  Dilation: 2.5 Effacement (%): 50 Station: -3  Fetal Status: Fetal Heart Rate (bpm): NST x2   Movement: Present    Fetal Surveillance Testing today: NST x2, decreased fetal movement  NST being performed due to decreased fetal movement   Fetal Monitoring:    TWIN A: Baseline: 130 bpm, Variability: moderate, Accelerations: present, The accelerations are >15 bpm and more than 2 in 20 minutes, and Decelerations: Absent     Final diagnosis:  Reactive NST  Twin B: Baseline: 140  bpm, Variability: moderate, Accelerations: present, The accelerations are >15 bpm and more than 2 in 20 minutes, and Decelerations: Absent     Final diagnosis:  Reactive NST    Chaperone: Faith Rogue    No results found for this or any previous visit (from the past 24 hour(s)).   Assessment & Plan:  High-risk pregnancy: [id number] at [redacted]w[redacted]d with an Estimated Date of Delivery: 11/10/22   1) DC/DA twins -antepartum testing starting next week -reactive NST x 2 today -Reviewed allergy precautions -Discussed induction around 38 weeks  2) anemia -Status post IV iron x 1 and has 2nd appt on 8/30  3) GBS positive Repeat testing  completed today for sensitivity  Meds: No orders of the defined types were placed in this encounter.   Labs/procedures today: NST x 2  Treatment Plan: OB care as outlined  Reviewed: Preterm labor symptoms and general obstetric precautions including but not limited to vaginal bleeding, contractions, leaking of fluid and fetal movement were reviewed in detail with the patient.  All questions were answered.   Follow-up: Return for Starting next week- NST twice weekly or BPP/NST and HROB weekly.   Future Appointments  Date Time Provider Department Center  10/11/2022  9:15 AM [facility] [place] [facility] None  10/11/2022 10:50 AM Lazaro Arms, MD [facility]  10/14/2022 10:50 AM [facility] NURSE [facility]  10/18/2022  9:15 AM [facility] [place] [facility] None  10/18/2022 10:50 AM Lazaro Arms, MD [facility]  10/20/2022 10:00 AM [facility] 1 [facility] None  10/21/2022  9:50 AM [facility] NURSE [facility]  10/25/2022  9:15 AM [facility] [place] [facility] None  10/25/2022 10:50 AM Lazaro Arms, MD [facility]  10/28/2022  9:50 AM [facility] NURSE [facility]  03/29/2023  1:00 PM Anabel Halon, MD [facility]    No orders of the defined types were placed in this encounter.   Myna Hidalgo, DO Attending Obstetrician & Gynecologist, [facility] for [employer], [facility]

## 2022-10-10 ENCOUNTER — Other Ambulatory Visit: Payer: Self-pay | Admitting: Women's Health

## 2022-10-10 DIAGNOSIS — O30042 Twin pregnancy, dichorionic/diamniotic, second trimester: Secondary | ICD-10-CM

## 2022-10-10 DIAGNOSIS — O0992 Supervision of high risk pregnancy, unspecified, second trimester: Secondary | ICD-10-CM

## 2022-10-10 LAB — CERVICOVAGINAL ANCILLARY ONLY
Chlamydia: NEGATIVE
Comment: NEGATIVE
Comment: NORMAL
Neisseria Gonorrhea: NEGATIVE

## 2022-10-11 ENCOUNTER — Ambulatory Visit (INDEPENDENT_AMBULATORY_CARE_PROVIDER_SITE_OTHER): Payer: Medicaid Other

## 2022-10-11 ENCOUNTER — Encounter: Payer: Self-pay | Admitting: Obstetrics & Gynecology

## 2022-10-11 ENCOUNTER — Ambulatory Visit (INDEPENDENT_AMBULATORY_CARE_PROVIDER_SITE_OTHER): Payer: Medicaid Other | Admitting: Obstetrics & Gynecology

## 2022-10-11 VITALS — BP 118/81 | HR 110 | Wt 247.0 lb

## 2022-10-11 DIAGNOSIS — Z3A35 35 weeks gestation of pregnancy: Secondary | ICD-10-CM

## 2022-10-11 DIAGNOSIS — O30043 Twin pregnancy, dichorionic/diamniotic, third trimester: Secondary | ICD-10-CM | POA: Diagnosis not present

## 2022-10-11 DIAGNOSIS — O0993 Supervision of high risk pregnancy, unspecified, third trimester: Secondary | ICD-10-CM

## 2022-10-11 DIAGNOSIS — O30042 Twin pregnancy, dichorionic/diamniotic, second trimester: Secondary | ICD-10-CM

## 2022-10-11 DIAGNOSIS — O0992 Supervision of high risk pregnancy, unspecified, second trimester: Secondary | ICD-10-CM

## 2022-10-11 DIAGNOSIS — O99013 Anemia complicating pregnancy, third trimester: Secondary | ICD-10-CM

## 2022-10-11 NOTE — Progress Notes (Signed)
[place] 35+5 wks,DC/DA twins BABY A:cephalic left,posterior placenta gr 3,SVP of fluid 6 cm,fhr 132 bpm,BPP 8/8,EFW 2667 g 41%,discordance 5.7% BABY B:frank breech right,posterior fundal placenta gr 3,fundal marginal cord insertion,SVP of fluid 4.9 cm,FHR 135 bpm,EFW 2828 g 58%

## 2022-10-11 NOTE — Progress Notes (Signed)
HIGH-RISK PREGNANCY VISIT Patient name: Christina Conley MRN [id number]  Date of birth: 1995-09-12 Chief Complaint:   Routine Prenatal Visit  History of Present Illness:   Christina Conley is a 27 y.o. [id number] female at [redacted]w[redacted]d with an Estimated Date of Delivery: 11/10/22 being seen today for ongoing management of a high-risk pregnancy complicated by DCDA twins and anemia.    Today she reports no complaints. Contractions: Irregular. Vag. Bleeding: None.  Movement: Present. denies leaking of fluid.      05/03/2022   10:21 AM 03/23/2022    1:07 PM 06/23/2021    3:37 PM 03/03/2021    2:39 PM 02/03/2021   10:18 AM  Depression screen PHQ 2/9  Decreased Interest 1 0 0 0 0  Down, Depressed, Hopeless 1 0 0 1 1  PHQ - 2 Score 2 0 0 1 1  Altered sleeping 0   3 3  Tired, decreased energy 2   3 3   Change in appetite 0   3 0  Feeling bad or failure about yourself  0   0 0  Trouble concentrating 0   0 0  Moving slowly or fidgety/restless 0   0 0  Suicidal thoughts 0   0 0  PHQ-9 Score 4   10 7   Difficult doing work/chores    Somewhat difficult Somewhat difficult        05/03/2022   10:21 AM 02/03/2021   10:19 AM 02/26/2020    2:56 PM 01/14/2020    9:48 AM  GAD 7 : Generalized Anxiety Score  Nervous, Anxious, on Edge 2 1 1 2   Control/stop worrying 0 1 2 1   Worry too much - different things 0 1 2 3   Trouble relaxing 0 0 3 1  Restless 0 0 0 0  Easily annoyed or irritable 3 1 2 3   Afraid - awful might happen 0 0 0 2  Total GAD 7 Score 5 4 10 12   Anxiety Difficulty  Somewhat difficult       Review of Systems:   Pertinent items are noted in HPI Denies abnormal vaginal discharge w/ itching/odor/irritation, headaches, visual changes, shortness of breath, chest pain, abdominal pain, severe nausea/vomiting, or problems with urination or bowel movements unless otherwise stated above. Pertinent History Reviewed:  Reviewed past medical,surgical, social, obstetrical and family history.  Reviewed  problem list, medications and allergies. Physical Assessment:   Vitals:   10/11/22 1029 10/11/22 1039  BP: (!) 142/82 118/81  Pulse: 93 (!) 110  Weight: 247 lb (112 kg)   Body mass index is 34.45 kg/m.           Physical Examination:   General appearance: alert, well appearing, and in no distress  Mental status: alert, oriented to person, place, and time  Skin: warm & dry   Extremities:      Cardiovascular: normal heart rate noted  Respiratory: normal respiratory effort, no distress  Abdomen: gravid, soft, non-tender  Pelvic: Cervical exam deferred         Fetal Status:     Movement: Present    Fetal Surveillance Testing today: BPP 8/8 x 2, EFW both good, vertex/breech   Chaperone:     No results found for this or any previous visit (from the past 24 hour(s)).  Assessment & Plan:  High-risk pregnancy: [id number] at [redacted]w[redacted]d with an Estimated Date of Delivery: 11/10/22      ICD-10-CM   1. Supervision of high risk pregnancy in third trimester  O09.93     2. Dichorionic diamniotic twin pregnancy in third trimester  O30.043     3. Anemia affecting pregnancy in third trimester  O99.013          Meds: No orders of the defined types were placed in this encounter.   Orders: No orders of the defined types were placed in this encounter.    Labs/procedures today: U/S  Treatment Plan:  routine care for twin pregnancy, see below    Follow-up: No follow-ups on file.   Future Appointments  Date Time Provider Department Center  10/14/2022 10:50 AM [facility] NURSE [facility]  10/18/2022  9:15 AM [facility] [place] [facility] None  10/18/2022 10:50 AM Lazaro Arms, MD [facility]  10/20/2022 10:00 AM [facility] 1 [facility] None  10/21/2022  9:50 AM [facility] NURSE [facility]  10/25/2022  9:15 AM [facility] - [facility] [place] [facility] None  10/25/2022 10:50 AM Lazaro Arms, MD [facility]  10/28/2022  9:50 AM [facility] NURSE [facility]  03/29/2023  1:00 PM Anabel Halon,  MD [facility]    No orders of the defined types were placed in this encounter.  Lazaro Arms  Attending Physician for the Center for [facility] 10/11/2022 11:01 AM

## 2022-10-13 LAB — STREP GP B NAA+RFLX: Strep Gp B NAA+Rflx: POSITIVE — AB

## 2022-10-13 LAB — STREP GP B SUSCEPTIBILITY

## 2022-10-14 ENCOUNTER — Ambulatory Visit: Payer: Medicaid Other

## 2022-10-14 VITALS — BP 116/70 | HR 90 | Wt 243.3 lb

## 2022-10-14 DIAGNOSIS — Z3A36 36 weeks gestation of pregnancy: Secondary | ICD-10-CM

## 2022-10-14 DIAGNOSIS — O0993 Supervision of high risk pregnancy, unspecified, third trimester: Secondary | ICD-10-CM | POA: Diagnosis not present

## 2022-10-14 DIAGNOSIS — O30043 Twin pregnancy, dichorionic/diamniotic, third trimester: Secondary | ICD-10-CM | POA: Diagnosis not present

## 2022-10-14 NOTE — Progress Notes (Signed)
   NURSE VISIT- NST  SUBJECTIVE:  Christina Conley is a 27 y.o. [phone] female at [redacted]w[redacted]d, here for a NST for pregnancy complicated by Multiple gestation.  She reports active fetal movement, contractions: irregular, every 3-7 minutes, vaginal bleeding: none, membranes: intact.   OBJECTIVE:  BP 116/70   Pulse 90   Wt 243 lb 4.8 oz (110.4 kg)   LMP 01/28/2022 (Exact Date)   BMI 33.93 kg/m   Appears well, no apparent distress  No results found for this or any previous visit (from the past 24 hour(s)).  NST: FHR baseline 125/125 bpm, Variability: moderate, Accelerations:present, Decelerations:  Absent= Cat 1/reactive Toco: irregular, every 3-7 minutes   ASSESSMENT: [id number] at [redacted]w[redacted]d with Multiple gestation NST reactive  PLAN: EFM strip reviewed by  Albertine Grates FNP    Recommendations: keep next appointment as scheduled    Caralyn Guile  10/14/2022 12:14 PM

## 2022-10-17 ENCOUNTER — Other Ambulatory Visit: Payer: Self-pay | Admitting: Obstetrics & Gynecology

## 2022-10-17 DIAGNOSIS — O30043 Twin pregnancy, dichorionic/diamniotic, third trimester: Secondary | ICD-10-CM

## 2022-10-18 ENCOUNTER — Ambulatory Visit: Payer: Medicaid Other | Admitting: Obstetrics & Gynecology

## 2022-10-18 ENCOUNTER — Ambulatory Visit (INDEPENDENT_AMBULATORY_CARE_PROVIDER_SITE_OTHER): Payer: Medicaid Other

## 2022-10-18 ENCOUNTER — Encounter: Payer: Self-pay | Admitting: Obstetrics & Gynecology

## 2022-10-18 VITALS — BP 139/84 | HR 111 | Wt 245.0 lb

## 2022-10-18 DIAGNOSIS — Z3A36 36 weeks gestation of pregnancy: Secondary | ICD-10-CM

## 2022-10-18 DIAGNOSIS — O0993 Supervision of high risk pregnancy, unspecified, third trimester: Secondary | ICD-10-CM

## 2022-10-18 DIAGNOSIS — O30043 Twin pregnancy, dichorionic/diamniotic, third trimester: Secondary | ICD-10-CM

## 2022-10-18 NOTE — Progress Notes (Signed)
HIGH-RISK PREGNANCY VISIT Patient name: Christina Conley MRN [id number]  Date of birth: 1995/05/19 Chief Complaint:   Routine Prenatal Visit  History of Present Illness:   Christina Conley is a 27 y.o. [id number] female at [redacted]w[redacted]d with an Estimated Date of Delivery: 11/10/22 being seen today for ongoing management of a high-risk pregnancy complicated by DCDA twin pregnancy.    Today she reports no complaints. Contractions: Irritability. Vag. Bleeding: None.  Movement: Present. denies leaking of fluid.      05/03/2022   10:21 AM 03/23/2022    1:07 PM 06/23/2021    3:37 PM 03/03/2021    2:39 PM 02/03/2021   10:18 AM  Depression screen PHQ 2/9  Decreased Interest 1 0 0 0 0  Down, Depressed, Hopeless 1 0 0 1 1  PHQ - 2 Score 2 0 0 1 1  Altered sleeping 0   3 3  Tired, decreased energy 2   3 3   Change in appetite 0   3 0  Feeling bad or failure about yourself  0   0 0  Trouble concentrating 0   0 0  Moving slowly or fidgety/restless 0   0 0  Suicidal thoughts 0   0 0  PHQ-9 Score 4   10 7   Difficult doing work/chores    Somewhat difficult Somewhat difficult        05/03/2022   10:21 AM 02/03/2021   10:19 AM 02/26/2020    2:56 PM 01/14/2020    9:48 AM  GAD 7 : Generalized Anxiety Score  Nervous, Anxious, on Edge 2 1 1 2   Control/stop worrying 0 1 2 1   Worry too much - different things 0 1 2 3   Trouble relaxing 0 0 3 1  Restless 0 0 0 0  Easily annoyed or irritable 3 1 2 3   Afraid - awful might happen 0 0 0 2  Total GAD 7 Score 5 4 10 12   Anxiety Difficulty  Somewhat difficult       Review of Systems:   Pertinent items are noted in HPI Denies abnormal vaginal discharge w/ itching/odor/irritation, headaches, visual changes, shortness of breath, chest pain, abdominal pain, severe nausea/vomiting, or problems with urination or bowel movements unless otherwise stated above. Pertinent History Reviewed:  Reviewed past medical,surgical, social, obstetrical and family history.  Reviewed  problem list, medications and allergies. Physical Assessment:   Vitals:   10/18/22 1017  BP: 139/84  Pulse: (!) 111  Weight: 245 lb (111.1 kg)  Body mass index is 34.17 kg/m.           Physical Examination:   General appearance: alert, well appearing, and in no distress  Mental status: alert, oriented to person, place, and time  Skin: warm & dry   Extremities:      Cardiovascular: normal heart rate noted  Respiratory: normal respiratory effort, no distress  Abdomen: gravid, soft, non-tender  Pelvic: Cervical exam deferred         Fetal Status:     Movement: Present    Fetal Surveillance Testing today: BPP 8/8 x 2   Chaperone: N/A    No results found for this or any previous visit (from the past 24 hour(s)).  Assessment & Plan:  High-risk pregnancy: [id number] at [redacted]w[redacted]d with an Estimated Date of Delivery: 11/10/22      ICD-10-CM   1. Supervision of high risk pregnancy in third trimester  O09.93     2. Dichorionic diamniotic twin pregnancy: IOL  10/27/23  O30.043         Meds: No orders of the defined types were placed in this encounter.   Orders: No orders of the defined types were placed in this encounter.    Labs/procedures today: none  Treatment Plan:  IOL 10/27/22    Follow-up: Return for keep scheduled.   Future Appointments  Date Time Provider Department Center  10/20/2022 10:00 AM [facility] 1 [facility] None  10/21/2022  9:50 AM [facility] NURSE [facility]  10/25/2022  9:15 AM [facility] - [facility] [place] [facility] None  10/25/2022 10:50 AM Lazaro Arms, MD [facility]  10/28/2022  9:50 AM [facility] NURSE [facility]  03/29/2023  1:00 PM Anabel Halon, MD [facility]    No orders of the defined types were placed in this encounter.  Lazaro Arms  Attending Physician for the Center for [facility] 10/18/2022 10:56 AM

## 2022-10-18 NOTE — Progress Notes (Signed)
[place] 36+5 wks DC/DA twins BABY A :cephalic left,posterior placenta gr 3,FHR 129 bpm,SVP of fluid 5.7 cm,BPP 8/8 BABY B: cephalic right,posterior placenta gr 3,FHR 148 bpm,SVP of fluid 6.5 cm,BPP 8/8

## 2022-10-18 NOTE — Progress Notes (Signed)
   Induction Assessment Scheduling Form: Fax to [facility]:  [phone]  Christina Conley                                                                                   DOB:  December 10, 1995                                                            MRN:  [id number]                                                                     Phone #:   [phone]                         Provider:  [facility]  GP:  [id number]                                                            Estimated Date of Delivery: 11/10/22  Dating Criteria: 1st trimester sonogram    Medical Indications for induction:  DCDA twins at 71 weeks Admission Date/Time:  10/27/22 am Gestational age on admission:  [redacted]w[redacted]d   Filed Weights   10/18/22 1017  Weight: 245 lb (111.1 kg)   HIV:  Non Reactive (06/25 0848) GBS: --[name] (08/15 1400)  Cervical exam not done   Method of induction(proposed):  choice   Scheduling Provider Signature:  Lazaro Arms, MD                                            Today's Date:  10/18/2022

## 2022-10-20 ENCOUNTER — Encounter (HOSPITAL_COMMUNITY): Payer: Self-pay | Admitting: *Deleted

## 2022-10-20 ENCOUNTER — Telehealth (HOSPITAL_COMMUNITY): Payer: Self-pay | Admitting: *Deleted

## 2022-10-20 VITALS — BP 99/74 | HR 91 | Temp 98.1°F | Resp 18

## 2022-10-20 DIAGNOSIS — O30043 Twin pregnancy, dichorionic/diamniotic, third trimester: Secondary | ICD-10-CM | POA: Diagnosis not present

## 2022-10-20 DIAGNOSIS — Z3A00 Weeks of gestation of pregnancy not specified: Secondary | ICD-10-CM | POA: Diagnosis not present

## 2022-10-20 DIAGNOSIS — O99013 Anemia complicating pregnancy, third trimester: Secondary | ICD-10-CM | POA: Diagnosis not present

## 2022-10-20 DIAGNOSIS — O30042 Twin pregnancy, dichorionic/diamniotic, second trimester: Secondary | ICD-10-CM

## 2022-10-20 MED ORDER — IRON SUCROSE 500 MG IVPB - SIMPLE MED
500.0000 mg | Freq: Once | INTRAVENOUS | Status: AC
  Administered 2022-10-20: 500 mg via INTRAVENOUS
  Filled 2022-10-20: qty 275

## 2022-10-20 MED ORDER — ACETAMINOPHEN 325 MG PO TABS
650.0000 mg | ORAL_TABLET | Freq: Once | ORAL | Status: AC
  Administered 2022-10-20: 650 mg via ORAL
  Filled 2022-10-20: qty 2

## 2022-10-20 MED ORDER — DIPHENHYDRAMINE HCL 25 MG PO CAPS
25.0000 mg | ORAL_CAPSULE | Freq: Once | ORAL | Status: AC
  Administered 2022-10-20: 25 mg via ORAL
  Filled 2022-10-20: qty 1

## 2022-10-20 MED ORDER — IRON SUCROSE 500 MG IVPB - SIMPLE MED
500.0000 mg | Freq: Once | INTRAVENOUS | Status: DC
  Filled 2022-10-20: qty 275

## 2022-10-20 NOTE — Progress Notes (Signed)
Diagnosis: Iron Deficiency Anemia  Provider:  Myna Hidalgo DO  Procedure: IV Infusion  IV Type: Peripheral, IV Location: L Hand  Venofer (Iron Sucrose), Dose: 500 mg  Infusion Start Time: 1101  Infusion Stop Time: 1505  Post Infusion IV Care: Observation period completed and Peripheral IV Discontinued  Discharge: Condition: Good, Destination: Home . AVS Provided  Performed by:  Marlow Baars [place], RN

## 2022-10-20 NOTE — Telephone Encounter (Signed)
Preadmission screen  

## 2022-10-21 ENCOUNTER — Other Ambulatory Visit (HOSPITAL_COMMUNITY)
Admission: RE | Admit: 2022-10-21 | Discharge: 2022-10-21 | Disposition: A | Discharge status: Home / Self Care | Payer: Medicaid Other | Source: Ambulatory Visit | Attending: Obstetrics & Gynecology | Admitting: Obstetrics & Gynecology

## 2022-10-21 ENCOUNTER — Other Ambulatory Visit: Payer: Self-pay | Admitting: Obstetrics & Gynecology

## 2022-10-21 ENCOUNTER — Ambulatory Visit: Payer: Medicaid Other | Admitting: *Deleted

## 2022-10-21 ENCOUNTER — Other Ambulatory Visit: Payer: Medicaid Other

## 2022-10-21 VITALS — BP 125/69 | HR 101 | Wt 245.3 lb

## 2022-10-21 DIAGNOSIS — O30043 Twin pregnancy, dichorionic/diamniotic, third trimester: Secondary | ICD-10-CM

## 2022-10-21 DIAGNOSIS — O288 Other abnormal findings on antenatal screening of mother: Secondary | ICD-10-CM

## 2022-10-21 DIAGNOSIS — N898 Other specified noninflammatory disorders of vagina: Secondary | ICD-10-CM | POA: Insufficient documentation

## 2022-10-21 DIAGNOSIS — O0993 Supervision of high risk pregnancy, unspecified, third trimester: Secondary | ICD-10-CM

## 2022-10-21 DIAGNOSIS — Z3A37 37 weeks gestation of pregnancy: Secondary | ICD-10-CM | POA: Diagnosis not present

## 2022-10-21 NOTE — Progress Notes (Signed)
   NURSE VISIT- NST  SUBJECTIVE:  Christina Conley is a 27 y.o. [phone] female at [redacted]w[redacted]d, here for a NST for pregnancy complicated by Multiple gestation.  She reports active fetal movement, contractions: irregular, vaginal bleeding: none, membranes: intact.   OBJECTIVE:  BP 125/69   Pulse (!) 101   Wt 245 lb 4.8 oz (111.3 kg)   LMP 01/28/2022 (Exact Date)   BMI 34.21 kg/m   Appears well, no apparent distress  No results found for this or any previous visit (from the past 24 hour(s)).  NST: FHR A baseline 130 bpm, Variability: moderate, Accelerations:present, Decelerations:  Absent= Cat 1/reactive FHR B baseline 135, variability:moderate, Accelerations:present, decelerations;absent=Cat 1/reactive Toco: occasional    ASSESSMENT: [id number] at [redacted]w[redacted]d with Multiple gestation NST reactive  PLAN: EFM strip reviewed by Dr. Despina Hidden   Recommendations: keep next appointment as scheduled    Christina Conley  10/21/2022 11:10 AM

## 2022-10-25 ENCOUNTER — Encounter: Payer: Self-pay | Admitting: Obstetrics & Gynecology

## 2022-10-25 ENCOUNTER — Ambulatory Visit (INDEPENDENT_AMBULATORY_CARE_PROVIDER_SITE_OTHER): Payer: Medicaid Other

## 2022-10-25 ENCOUNTER — Ambulatory Visit (INDEPENDENT_AMBULATORY_CARE_PROVIDER_SITE_OTHER): Payer: Medicaid Other | Admitting: Obstetrics & Gynecology

## 2022-10-25 VITALS — BP 114/71 | HR 101 | Wt 248.0 lb

## 2022-10-25 DIAGNOSIS — O30043 Twin pregnancy, dichorionic/diamniotic, third trimester: Secondary | ICD-10-CM

## 2022-10-25 DIAGNOSIS — Z3A37 37 weeks gestation of pregnancy: Secondary | ICD-10-CM

## 2022-10-25 DIAGNOSIS — O0993 Supervision of high risk pregnancy, unspecified, third trimester: Secondary | ICD-10-CM

## 2022-10-25 NOTE — Progress Notes (Signed)
[place] 37+5 wks,DC/DA TWINS BABY A:cephalic left,posterior placenta gr 3,BPP 8/8,FHR 133 bpm,SVP of fluid 6 cm BABY B:cephalic right,posterior placenta gr 3,BPP 8/8,SVP of fluid 6  cm,FHR 133 bpm

## 2022-10-25 NOTE — Progress Notes (Signed)
HIGH-RISK PREGNANCY VISIT Patient name: Christina Conley MRN [id number]  Date of birth: December 21, 1995 Chief Complaint:   Routine Prenatal Visit (NST)  History of Present Illness:   Christina Conley is a 27 y.o. [phone] female at [redacted]w[redacted]d with an Estimated Date of Delivery: 11/10/22 being seen today for ongoing management of a high-risk pregnancy complicated by DCDA twin pregnancy.    Today she reports no complaints. Contractions: Regular. Vag. Bleeding: None.  Movement: Present. denies leaking of fluid.      05/03/2022   10:21 AM 03/23/2022    1:07 PM 06/23/2021    3:37 PM 03/03/2021    2:39 PM 02/03/2021   10:18 AM  Depression screen PHQ 2/9  Decreased Interest 1 0 0 0 0  Down, Depressed, Hopeless 1 0 0 1 1  PHQ - 2 Score 2 0 0 1 1  Altered sleeping 0   3 3  Tired, decreased energy 2   3 3   Change in appetite 0   3 0  Feeling bad or failure about yourself  0   0 0  Trouble concentrating 0   0 0  Moving slowly or fidgety/restless 0   0 0  Suicidal thoughts 0   0 0  PHQ-9 Score 4   10 7   Difficult doing work/chores    Somewhat difficult Somewhat difficult        05/03/2022   10:21 AM 02/03/2021   10:19 AM 02/26/2020    2:56 PM 01/14/2020    9:48 AM  GAD 7 : Generalized Anxiety Score  Nervous, Anxious, on Edge 2 1 1 2   Control/stop worrying 0 1 2 1   Worry too much - different things 0 1 2 3   Trouble relaxing 0 0 3 1  Restless 0 0 0 0  Easily annoyed or irritable 3 1 2 3   Afraid - awful might happen 0 0 0 2  Total GAD 7 Score 5 4 10 12   Anxiety Difficulty  Somewhat difficult       Review of Systems:   Pertinent items are noted in HPI Denies abnormal vaginal discharge w/ itching/odor/irritation, headaches, visual changes, shortness of breath, chest pain, abdominal pain, severe nausea/vomiting, or problems with urination or bowel movements unless otherwise stated above. Pertinent History Reviewed:  Reviewed past medical,surgical, social, obstetrical and family history.  Reviewed  problem list, medications and allergies. Physical Assessment:   Vitals:   10/25/22 1013  BP: 114/71  Pulse: (!) 101  Weight: 248 lb (112.5 kg)  Body mass index is 34.59 kg/m.           Physical Examination:   General appearance: alert, well appearing, and in no distress  Mental status: alert, oriented to person, place, and time  Skin: warm & dry   Extremities: Edema: Trace    Cardiovascular: normal heart rate noted  Respiratory: normal respiratory effort, no distress  Abdomen: gravid, soft, non-tender  Pelvic: Cervical exam deferred         Fetal Status:     Movement: Present    Fetal Surveillance Testing today: BPP 8/8 x 2   Chaperone: N/A    No results found for this or any previous visit (from the past 24 hour(s)).  Assessment & Plan:  High-risk pregnancy: [id number] at [redacted]w[redacted]d with an Estimated Date of Delivery: 11/10/22      ICD-10-CM   1. Supervision of high risk pregnancy in third trimester  O09.93     2. Dichorionic diamniotic twin pregnancy  in third trimester  O30.043         Meds: No orders of the defined types were placed in this encounter.   Orders: No orders of the defined types were placed in this encounter.    Labs/procedures today: sonogram Treatment Plan:  IOL 2 days    Follow-up: Return in about 6 weeks (around 12/06/2022) for post partum visit.   Future Appointments  Date Time Provider Department Center  10/27/2022  7:00 AM [facility] SCHED ROOM [facility] None  10/28/2022  9:50 AM [facility] NURSE [facility]  03/29/2023  1:00 PM Anabel Halon, MD [facility]    No orders of the defined types were placed in this encounter.  Lazaro Arms  Attending Physician for the Center for [facility] 10/25/2022 11:08 AM

## 2022-10-26 ENCOUNTER — Other Ambulatory Visit: Payer: Self-pay | Admitting: Advanced Practice Midwife

## 2022-10-27 ENCOUNTER — Inpatient Hospital Stay (HOSPITAL_COMMUNITY): Admission: RE | Admit: 2022-10-27 | Payer: Medicaid Other | Source: Home / Self Care | Admitting: Family Medicine

## 2022-10-27 ENCOUNTER — Inpatient Hospital Stay (HOSPITAL_COMMUNITY): Payer: Medicaid Other | Attending: Family Medicine

## 2022-10-27 ENCOUNTER — Inpatient Hospital Stay (HOSPITAL_COMMUNITY)
Admission: AD | Admit: 2022-10-27 | Discharge: 2022-10-30 | DRG: 807 | Disposition: A | Discharge status: Home / Self Care | Payer: Medicaid Other | Attending: Obstetrics and Gynecology | Admitting: Obstetrics and Gynecology

## 2022-10-27 DIAGNOSIS — Z833 Family history of diabetes mellitus: Secondary | ICD-10-CM | POA: Diagnosis not present

## 2022-10-27 DIAGNOSIS — Z880 Allergy status to penicillin: Secondary | ICD-10-CM | POA: Diagnosis not present

## 2022-10-27 DIAGNOSIS — Z3A38 38 weeks gestation of pregnancy: Secondary | ICD-10-CM

## 2022-10-27 DIAGNOSIS — O99013 Anemia complicating pregnancy, third trimester: Secondary | ICD-10-CM | POA: Diagnosis present

## 2022-10-27 DIAGNOSIS — Z8249 Family history of ischemic heart disease and other diseases of the circulatory system: Secondary | ICD-10-CM | POA: Diagnosis not present

## 2022-10-27 DIAGNOSIS — O285 Abnormal chromosomal and genetic finding on antenatal screening of mother: Secondary | ICD-10-CM | POA: Diagnosis present

## 2022-10-27 DIAGNOSIS — O99824 Streptococcus B carrier state complicating childbirth: Secondary | ICD-10-CM | POA: Diagnosis not present

## 2022-10-27 DIAGNOSIS — O30043 Twin pregnancy, dichorionic/diamniotic, third trimester: Principal | ICD-10-CM | POA: Diagnosis present

## 2022-10-27 DIAGNOSIS — O30049 Twin pregnancy, dichorionic/diamniotic, unspecified trimester: Secondary | ICD-10-CM | POA: Diagnosis present

## 2022-10-27 DIAGNOSIS — O99820 Streptococcus B carrier state complicating pregnancy: Secondary | ICD-10-CM | POA: Diagnosis not present

## 2022-10-27 DIAGNOSIS — Z30017 Encounter for initial prescription of implantable subdermal contraceptive: Secondary | ICD-10-CM | POA: Diagnosis not present

## 2022-10-27 DIAGNOSIS — O30003 Twin pregnancy, unspecified number of placenta and unspecified number of amniotic sacs, third trimester: Principal | ICD-10-CM | POA: Diagnosis present

## 2022-10-27 DIAGNOSIS — Z87891 Personal history of nicotine dependence: Secondary | ICD-10-CM

## 2022-10-27 DIAGNOSIS — O0990 Supervision of high risk pregnancy, unspecified, unspecified trimester: Secondary | ICD-10-CM

## 2022-10-27 DIAGNOSIS — O321XX2 Maternal care for breech presentation, fetus 2: Secondary | ICD-10-CM | POA: Diagnosis not present

## 2022-10-27 DIAGNOSIS — Z7982 Long term (current) use of aspirin: Secondary | ICD-10-CM

## 2022-10-27 DIAGNOSIS — O0993 Supervision of high risk pregnancy, unspecified, third trimester: Secondary | ICD-10-CM

## 2022-10-27 DIAGNOSIS — O30042 Twin pregnancy, dichorionic/diamniotic, second trimester: Secondary | ICD-10-CM

## 2022-10-27 LAB — CERVICOVAGINAL ANCILLARY ONLY
Chlamydia: NEGATIVE
Comment: NEGATIVE
Comment: NORMAL
Neisseria Gonorrhea: NEGATIVE

## 2022-10-27 NOTE — MAU Note (Signed)
.  Christina Conley is a 27 y.o. at [redacted]w[redacted]d here in MAU reporting suppose to be induced this am but was unable to get in due to busyness in L&D. States called earlier tonight and was told to come in. Reports some lower abd pain at pubic bone that is worse with movement. Reports good FM of both babies. Denies LOF or VB.  Onset of complaint: last night for pubic bone pain Pain score: 8 Vitals:   10/27/22 2130 10/27/22 2132  BP:  117/72  Pulse: 88   Resp: 18   Temp: 97.8 F (36.6 C)   SpO2: 100%      FHT:155 baby A and 135 baby B Lab orders placed from triage:

## 2022-10-28 ENCOUNTER — Encounter (HOSPITAL_COMMUNITY): Payer: Self-pay | Admitting: Obstetrics & Gynecology

## 2022-10-28 ENCOUNTER — Inpatient Hospital Stay (HOSPITAL_COMMUNITY): Payer: Medicaid Other

## 2022-10-28 ENCOUNTER — Other Ambulatory Visit: Payer: Medicaid Other

## 2022-10-28 DIAGNOSIS — Z3A38 38 weeks gestation of pregnancy: Secondary | ICD-10-CM

## 2022-10-28 DIAGNOSIS — O30043 Twin pregnancy, dichorionic/diamniotic, third trimester: Secondary | ICD-10-CM

## 2022-10-28 DIAGNOSIS — O321XX2 Maternal care for breech presentation, fetus 2: Secondary | ICD-10-CM

## 2022-10-28 DIAGNOSIS — O99820 Streptococcus B carrier state complicating pregnancy: Secondary | ICD-10-CM

## 2022-10-28 DIAGNOSIS — O30003 Twin pregnancy, unspecified number of placenta and unspecified number of amniotic sacs, third trimester: Principal | ICD-10-CM | POA: Diagnosis present

## 2022-10-28 DIAGNOSIS — Z30017 Encounter for initial prescription of implantable subdermal contraceptive: Secondary | ICD-10-CM

## 2022-10-28 LAB — TYPE AND SCREEN
ABO/RH(D): O POS
Antibody Screen: NEGATIVE

## 2022-10-28 LAB — CBC
HCT: 31.6 % — ABNORMAL LOW (ref 36.0–46.0)
Hemoglobin: 10.2 g/dL — ABNORMAL LOW (ref 12.0–15.0)
MCH: 25.8 pg — ABNORMAL LOW (ref 26.0–34.0)
MCHC: 32.3 g/dL (ref 30.0–36.0)
MCV: 80 fL (ref 80.0–100.0)
Platelets: 275 10*3/uL (ref 150–400)
RBC: 3.95 MIL/uL (ref 3.87–5.11)
RDW: 24.1 % — ABNORMAL HIGH (ref 11.5–15.5)
WBC: 9 10*3/uL (ref 4.0–10.5)
nRBC: 0 % (ref 0.0–0.2)

## 2022-10-28 LAB — RAPID HIV SCREEN (HIV 1/2 AB+AG)
HIV 1/2 Antibodies: NONREACTIVE
HIV-1 P24 Antigen - HIV24: NONREACTIVE

## 2022-10-28 LAB — CREATININE, SERUM
Creatinine, Ser: 0.83 mg/dL (ref 0.44–1.00)
GFR, Estimated: >60 mL/min (ref >60)

## 2022-10-28 LAB — RPR: RPR Ser Ql: NONREACTIVE

## 2022-10-28 MED ORDER — ONDANSETRON HCL 4 MG/2ML IJ SOLN: 4.0000 mg | INTRAMUSCULAR | Status: DC | PRN

## 2022-10-28 MED ORDER — FENTANYL-BUPIVACAINE-NACL 0.5-0.125-0.9 MG/250ML-% EP SOLN
EPIDURAL | Status: DC | PRN
  Administered 2022-10-28: 12 mL/h via EPIDURAL

## 2022-10-28 MED ORDER — EPHEDRINE 5 MG/ML INJ: 10.0000 mg | INTRAVENOUS | Status: DC | PRN

## 2022-10-28 MED ORDER — COCONUT OIL OIL
1.0000 | TOPICAL_OIL | Status: DC | PRN
  Administered 2022-10-29: 1 via TOPICAL

## 2022-10-28 MED ORDER — MISOPROSTOL 25 MCG QUARTER TABLET: 25.0000 ug | ORAL_TABLET | Freq: Once | ORAL | Status: DC

## 2022-10-28 MED ORDER — BENZOCAINE-MENTHOL 20-0.5 % EX AERO
1.0000 | INHALATION_SPRAY | CUTANEOUS | Status: DC | PRN
  Administered 2022-10-28: 1 via TOPICAL
  Filled 2022-10-28: qty 56

## 2022-10-28 MED ORDER — ONDANSETRON HCL 4 MG PO TABS
4.0000 mg | ORAL_TABLET | ORAL | Status: DC | PRN
  Filled 2022-10-28: qty 1

## 2022-10-28 MED ORDER — PRENATAL MULTIVITAMIN CH
1.0000 | ORAL_TABLET | Freq: Every day | ORAL | Status: DC
  Administered 2022-10-29 – 2022-10-30 (×2): 1 via ORAL
  Filled 2022-10-28 (×2): qty 1

## 2022-10-28 MED ORDER — MISOPROSTOL 50MCG HALF TABLET: 50.0000 ug | ORAL_TABLET | Freq: Once | ORAL | Status: DC

## 2022-10-28 MED ORDER — LIDOCAINE HCL (PF) 1 % IJ SOLN: 30.0000 mL | INTRAMUSCULAR | Status: DC | PRN

## 2022-10-28 MED ORDER — LACTATED RINGERS IV SOLN: INTRAVENOUS | Status: DC

## 2022-10-28 MED ORDER — ACETAMINOPHEN 325 MG PO TABS: 650.0000 mg | ORAL_TABLET | ORAL | Status: DC | PRN

## 2022-10-28 MED ORDER — SOD CITRATE-CITRIC ACID 500-334 MG/5ML PO SOLN: 30.0000 mL | ORAL | Status: DC | PRN

## 2022-10-28 MED ORDER — ACETAMINOPHEN 325 MG PO TABS
650.0000 mg | ORAL_TABLET | ORAL | Status: DC | PRN
  Administered 2022-10-28 – 2022-10-30 (×5): 650 mg via ORAL
  Filled 2022-10-28 (×6): qty 2

## 2022-10-28 MED ORDER — LIDOCAINE HCL (PF) 1 % IJ SOLN
INTRAMUSCULAR | Status: DC | PRN
  Administered 2022-10-28 (×2): 5 mL via EPIDURAL

## 2022-10-28 MED ORDER — FENTANYL CITRATE (PF) 100 MCG/2ML IJ SOLN: 50.0000 ug | INTRAMUSCULAR | Status: DC | PRN

## 2022-10-28 MED ORDER — PHENYLEPHRINE 80 MCG/ML (10ML) SYRINGE FOR IV PUSH (FOR BLOOD PRESSURE SUPPORT): 80.0000 ug | PREFILLED_SYRINGE | INTRAVENOUS | Status: DC | PRN

## 2022-10-28 MED ORDER — OXYCODONE-ACETAMINOPHEN 5-325 MG PO TABS: 2.0000 | ORAL_TABLET | ORAL | Status: DC | PRN

## 2022-10-28 MED ORDER — ONDANSETRON HCL 4 MG/2ML IJ SOLN: 4.0000 mg | Freq: Four times a day (QID) | INTRAMUSCULAR | Status: DC | PRN

## 2022-10-28 MED ORDER — OXYTOCIN-SODIUM CHLORIDE 30-0.9 UT/500ML-% IV SOLN
2.5000 [IU]/h | INTRAVENOUS | Status: DC
  Administered 2022-10-28: 2.5 [IU]/h via INTRAVENOUS

## 2022-10-28 MED ORDER — WITCH HAZEL-GLYCERIN EX PADS: 1.0000 | MEDICATED_PAD | CUTANEOUS | Status: DC | PRN

## 2022-10-28 MED ORDER — CALCIUM CARBONATE ANTACID 500 MG PO CHEW
400.0000 mg | CHEWABLE_TABLET | ORAL | Status: DC | PRN
  Administered 2022-10-28: 400 mg via ORAL
  Filled 2022-10-28: qty 2

## 2022-10-28 MED ORDER — TERBUTALINE SULFATE 1 MG/ML IJ SOLN: 0.2500 mg | Freq: Once | INTRAMUSCULAR | Status: DC | PRN

## 2022-10-28 MED ORDER — SIMETHICONE 80 MG PO CHEW: 80.0000 mg | CHEWABLE_TABLET | ORAL | Status: DC | PRN

## 2022-10-28 MED ORDER — LACTATED RINGERS IV SOLN
500.0000 mL | Freq: Once | INTRAVENOUS | Status: AC
  Administered 2022-10-28: 500 mL via INTRAVENOUS

## 2022-10-28 MED ORDER — TETANUS-DIPHTH-ACELL PERTUSSIS 5-2.5-18.5 LF-MCG/0.5 IM SUSY: 0.5000 mL | PREFILLED_SYRINGE | Freq: Once | INTRAMUSCULAR | Status: DC

## 2022-10-28 MED ORDER — FENTANYL-BUPIVACAINE-NACL 0.5-0.125-0.9 MG/250ML-% EP SOLN
12.0000 mL/h | EPIDURAL | Status: DC | PRN
  Filled 2022-10-28: qty 250

## 2022-10-28 MED ORDER — ZOLPIDEM TARTRATE 5 MG PO TABS: 5.0000 mg | ORAL_TABLET | Freq: Every evening | ORAL | Status: DC | PRN

## 2022-10-28 MED ORDER — OXYTOCIN BOLUS FROM INFUSION
333.0000 mL | Freq: Once | INTRAVENOUS | Status: AC
  Administered 2022-10-28: 333 mL via INTRAVENOUS

## 2022-10-28 MED ORDER — OXYTOCIN-SODIUM CHLORIDE 30-0.9 UT/500ML-% IV SOLN: 1.0000 m[IU]/min | INTRAVENOUS | Status: DC

## 2022-10-28 MED ORDER — VANCOMYCIN HCL 10 G IV SOLR
2000.0000 mg | Freq: Three times a day (TID) | INTRAVENOUS | Status: DC
  Administered 2022-10-28: 2000 mg via INTRAVENOUS
  Filled 2022-10-28: qty 20
  Filled 2022-10-28: qty 40
  Filled 2022-10-28: qty 20

## 2022-10-28 MED ORDER — FAMOTIDINE IN NACL 20-0.9 MG/50ML-% IV SOLN
20.0000 mg | Freq: Once | INTRAVENOUS | Status: AC
  Administered 2022-10-28: 20 mg via INTRAVENOUS
  Filled 2022-10-28: qty 50

## 2022-10-28 MED ORDER — CEFAZOLIN SODIUM-DEXTROSE 1-4 GM/50ML-% IV SOLN
1.0000 g | Freq: Three times a day (TID) | INTRAVENOUS | Status: DC
  Filled 2022-10-28 (×2): qty 50

## 2022-10-28 MED ORDER — OXYCODONE-ACETAMINOPHEN 5-325 MG PO TABS: 1.0000 | ORAL_TABLET | ORAL | Status: DC | PRN

## 2022-10-28 MED ORDER — ZOLPIDEM TARTRATE 5 MG PO TABS: 5.0000 mg | ORAL_TABLET | Freq: Once | ORAL | Status: DC

## 2022-10-28 MED ORDER — FERROUS SULFATE 325 (65 FE) MG PO TABS
325.0000 mg | ORAL_TABLET | Freq: Three times a day (TID) | ORAL | Status: DC
  Administered 2022-10-29 – 2022-10-30 (×4): 325 mg via ORAL
  Filled 2022-10-28 (×4): qty 1

## 2022-10-28 MED ORDER — LACTATED RINGERS IV SOLN: 500.0000 mL | INTRAVENOUS | Status: DC | PRN

## 2022-10-28 MED ORDER — VANCOMYCIN HCL IN DEXTROSE 1-5 GM/200ML-% IV SOLN: 1000.0000 mg | Freq: Three times a day (TID) | INTRAVENOUS | Status: DC

## 2022-10-28 MED ORDER — CEFAZOLIN SODIUM-DEXTROSE 2-4 GM/100ML-% IV SOLN
2.0000 g | Freq: Once | INTRAVENOUS | Status: AC
  Administered 2022-10-28: 2 g via INTRAVENOUS
  Filled 2022-10-28: qty 100

## 2022-10-28 MED ORDER — TRANEXAMIC ACID-NACL 1000-0.7 MG/100ML-% IV SOLN
1000.0000 mg | INTRAVENOUS | Status: AC
  Administered 2022-10-28: 1000 mg via INTRAVENOUS
  Filled 2022-10-28: qty 100

## 2022-10-28 MED ORDER — IBUPROFEN 600 MG PO TABS
600.0000 mg | ORAL_TABLET | Freq: Four times a day (QID) | ORAL | Status: DC
  Administered 2022-10-28 – 2022-10-30 (×6): 600 mg via ORAL
  Filled 2022-10-28 (×7): qty 1

## 2022-10-28 MED ORDER — SENNOSIDES-DOCUSATE SODIUM 8.6-50 MG PO TABS
2.0000 | ORAL_TABLET | Freq: Every day | ORAL | Status: DC
  Administered 2022-10-29 – 2022-10-30 (×2): 2 via ORAL
  Filled 2022-10-28 (×2): qty 2

## 2022-10-28 MED ORDER — OXYTOCIN-SODIUM CHLORIDE 30-0.9 UT/500ML-% IV SOLN
1.0000 m[IU]/min | INTRAVENOUS | Status: DC
  Administered 2022-10-28: 2 m[IU]/min via INTRAVENOUS
  Filled 2022-10-28: qty 500

## 2022-10-28 MED ORDER — DIBUCAINE (PERIANAL) 1 % EX OINT: 1.0000 | TOPICAL_OINTMENT | CUTANEOUS | Status: DC | PRN

## 2022-10-28 MED ORDER — DIPHENHYDRAMINE HCL 25 MG PO CAPS: 25.0000 mg | ORAL_CAPSULE | Freq: Four times a day (QID) | ORAL | Status: DC | PRN

## 2022-10-28 MED ORDER — DIPHENHYDRAMINE HCL 50 MG/ML IJ SOLN: 12.5000 mg | INTRAMUSCULAR | Status: DC | PRN

## 2022-10-28 NOTE — Progress Notes (Signed)
ANTIBIOTIC CONSULT NOTE  Pharmacy Consult for Vancomycin Indication: GBS Prophylaxis, clinda sensitivity not done  Allergies  Allergen Reactions   Amoxicillin Hives   Ampicillin Hives   Penicillins Hives    Has patient had a PCN reaction causing immediate rash, facial/tongue/throat swelling, SOB or lightheadedness with hypotension: Yes Has patient had a PCN reaction causing severe rash involving mucus membranes or skin necrosis: No Has patient had a PCN reaction that required hospitalization No Has patient had a PCN reaction occurring within the last 10 years: unknown If all of the above answers are "NO", then may proceed with Cephalosporin use.     Patient Measurements: Height: 5\' 11"  (180.3 cm) Weight: 112.9 kg (249 lb) IBW/kg (Calculated) : 70.8  Vital Signs: Temp: 98.8 F (37.1 C) (09/06 0046) Temp Source: Oral (09/06 0046) BP: 137/73 (09/06 0046) Pulse Rate: 112 (09/06 0046)  Labs: Recent Labs    10/28/22 0100  WBC 9.0  HGB 10.2*  PLT 275   No results for input(s): "VANCOTROUGH", "VANCOPEAK", "VANCORANDOM" in the last 72 hours.   Microbiology: Recent Results (from the past 720 hour(s))  Strep Gp B NAA+Rflx     Status: Abnormal   Collection Time: 10/06/22  2:00 PM   VR  Result Value Ref Range Status   Strep Gp B NAA+Rflx Positive (A) Negative Final    Comment: Centers for Disease Control and Prevention (CDC) and American Congress of Obstetricians and Gynecologists (ACOG) guidelines for prevention of perinatal group B streptococcal (GBS) disease specify co-collection of a vaginal and rectal swab specimen to maximize sensitivity of GBS detection. Per the CDC and ACOG, swabbing both the lower vagina and rectum substantially increases the yield of detection compared with sampling the vagina alone. Penicillin G, ampicillin, or cefazolin are indicated for intrapartum prophylaxis of perinatal GBS colonization. Reflex susceptibility testing should be performed  prior to use of clindamycin only on GBS isolates from penicillin-allergic women who are considered a high risk for anaphylaxis. Treatment with vancomycin without additional testing is warranted if resistance to clindamycin is noted.   Strep Gp B Susceptibility     Status: None   Collection Time: 10/06/22  2:00 PM   VR  Result Value Ref Range Status   Organism Identification, Strep B CANCELED      Comment: Test not performed. Organism not recovered in culture at [facility]. Therefore, susceptibility testing not performed.  Result canceled by the ancillary.    Clindamycin CANCELED      Comment: Test not performed. Organism not recovered in culture at [facility]. Therefore, susceptibility testing not performed. Testing for inducible clindamycin resistance was performed using erythromycin and clindamycin in the D-zone test. Per the Centers for Disease Control and Prevention (CDC), erythromycin is no longer an acceptable alternative for intrapartum group B Streptococcus (GBS) prophylaxis for penicillin-allergic women at high risk for anaphylaxis.  Result canceled by the ancillary.     Medications:  Vancomycin 2g Q8 hours  Assessment: 27 y.o. female [id number] at [redacted]w[redacted]d presenting for IOL for DC/DA twins. GBS positive, clinda sensitivity not done Baseline SCr pending  Goal of Therapy:  Vancomycin Trough 15-20 mg/L  Plan:  Vancomycin 2000 mg IV every 8 hrs  Check Scr with next labs if vancomycin continued beyond 48 hours. Will check vancomycin trough level prior to 4th or 5th dose.   Christina Conley 10/28/2022,1:41 AM

## 2022-10-28 NOTE — Lactation Note (Addendum)
This note was copied from a baby's chart. Lactation Consultation Note  Patient Name: Christina Conley TDVVO'H Date: 10/28/2022 Age:26 hours Reason for consult: Initial assessment;Early term 37-38.6wks;Multiple gestation P5, ETI female multiples. Birth Parent feeding choice is breast and formula feeding, both multiples were given 10 mls of formula at 2015 pm .  Baby B Birth Parent latched Baby B on her left breast using the cross cradle hold for 10 minutes, infant sustained latch, afterwards Support Person was doing STS. Birth Parent is experienced with breastfeeding multiples ( twins) see maternal data below. Birth Parent will continue to BF infant by cues, on demand, every 2-3 hours, skin to skin. Birth Parent knows to call RN/LC for further latch assistance if needed. Baby B was cuing again as LC left the room and Birth Parent placed Baby B back at the breast  on her right side using the cradle hold position.  Baby A Birth Parent attempted to latch Baby A on her right breast using the cradle hold, infant was not interested in BF, became sleepy. LC used breast model and discussed hand expression, Birth Parent self expressed 2 mls that was spoon fed to infant, infant was sleepy and placed back in basinet. Birth Parent will continue to work towards latching infant at the breast and will hand express and give colostrum back by spoon if infant doesn't not latch.   Today's Current feeding plan: 1- Birth Parent will continue to BF Baby A and Baby B by cues, on demand, every 2 to 3 hours, skin to skin. 2- Birth Parent will ask RN/LC for further latch assistance if needed. 3- Birth Parent knows how to hand express if infant 's doesn't latch and give them back her EBM by spoon.  Maternal Data Has patient been taught Hand Expression?: Yes Does the patient have breastfeeding experience prior to this delivery?: Yes How long did the patient breastfeed?: Per Birth Parent, she BF her twins who are 26 years old  for 4 months  Feeding Mother's Current Feeding Choice: Breast Milk and Formula Nipple Type: Slow - flow  LATCH Score ( Baby B). Latch: Grasps breast easily, tongue down, lips flanged, rhythmical sucking.  Audible Swallowing: A few with stimulation  Type of Nipple: Everted at rest and after stimulation  Comfort (Breast/Nipple): Soft / non-tender  Hold (Positioning): Assistance needed to correctly position infant at breast and maintain latch.  LATCH Score: 8   Lactation Tools Discussed/Used    Interventions Interventions: Breast feeding basics reviewed;Assisted with latch;Skin to skin;Hand express;Breast compression;Adjust position;Support pillows;Position options;Expressed milk;Education;LC Services brochure  Discharge Pump: DEBP;Personal  Consult Status Consult Status: Follow-up Date: 10/29/22 Follow-up type: In-patient    Christina Conley 10/28/2022, 11:22 PM

## 2022-10-28 NOTE — Anesthesia Preprocedure Evaluation (Signed)
Anesthesia Evaluation  Patient identified by MRN, date of birth, ID band Patient awake    Reviewed: Allergy & Precautions, H&P , NPO status , Patient's Chart, lab work & pertinent test results  Airway Mallampati: II  TM Distance: >3 FB Neck ROM: Full    Dental no notable dental hx.    Pulmonary asthma , former smoker Hx of childhood asthma   Pulmonary exam normal breath sounds clear to auscultation       Cardiovascular negative cardio ROS Normal cardiovascular exam Rhythm:Regular Rate:Normal     Neuro/Psych  PSYCHIATRIC DISORDERS  Depression    negative neurological ROS     GI/Hepatic negative GI ROS, Neg liver ROS,,,  Endo/Other  negative endocrine ROS    Renal/GU negative Renal ROS  negative genitourinary   Musculoskeletal negative musculoskeletal ROS (+)    Abdominal   Peds negative pediatric ROS (+)  Hematology  (+) Blood dyscrasia, anemia   Anesthesia Other Findings Current di/di pregnancy, Hx of prior multi-gestation pregnancy that was complicated by PPH.   Reproductive/Obstetrics (+) Pregnancy                             Anesthesia Physical Anesthesia Plan  ASA: 2  Anesthesia Plan: Epidural   Post-op Pain Management:    Induction:   PONV Risk Score and Plan: Treatment may vary due to age or medical condition  Airway Management Planned: Natural Airway  Additional Equipment:   Intra-op Plan:   Post-operative Plan:   Informed Consent: I have reviewed the patients History and Physical, chart, labs and discussed the procedure including the risks, benefits and alternatives for the proposed anesthesia with the patient or authorized representative who has indicated his/her understanding and acceptance.       Plan Discussed with: Anesthesiologist  Anesthesia Plan Comments: (Patient identified. Risks, benefits, options discussed with patient including but not limited to  bleeding, infection, nerve damage, paralysis, failed block, incomplete pain control, headache, blood pressure changes, nausea, vomiting, reactions to medication, itching, and post partum back pain. Confirmed with bedside nurse the patient's most recent platelet count. Confirmed with the patient that they are not taking any anticoagulation, have any bleeding history or any family history of bleeding disorders. Patient expressed understanding and wishes to proceed. All questions were answered. )       Anesthesia Quick Evaluation

## 2022-10-28 NOTE — Progress Notes (Signed)
LABOR PROGRESS NOTE  JARELLY SPARACIO is a 27 y.o. [id number] at [redacted]w[redacted]d presented for IOL for DC/DA twins.  S: Feeling comfortable after epidural. Amenable to AROM now.  O:  BP 119/69   Pulse 80   Temp 98.6 F (37 C) (Oral)   Resp 16   Ht 5\' 11"  (1.803 m)   Wt 112.9 kg   LMP 01/28/2022 (Exact Date)   SpO2 100%   BMI 34.73 kg/m    CVE: Dilation: 7 Effacement (%): 90 Station: 0 Presentation: Vertex Exam by:: Earlene Plater, MD   A&P: 27 y.o. [id number] [redacted]w[redacted]d  here for IOL as above  #Labor: Progressing well. Continue pit. AROM performed with clear/bloody fluid. Verbal consent obtained after discussion of risk/benefits #Pain: Epidural #FWB: CAT 1 #GBS positive  Joanne Gavel, MD FMOB Fellow, Faculty practice [facility], Center for [facility] 10/28/22  2:19 PM

## 2022-10-28 NOTE — Discharge Summary (Signed)
Postpartum Discharge Summary  Date of Service updated***     Patient Name: Christina Conley DOB: 1995-04-05 MRN: [id number]  Date of admission: 10/27/2022 Delivery date:   Elesha, Wydra [[id number]]  10/28/2022    Remmington, Theobald [[id number]]  10/28/2022 Delivering provider:    Leonel Ramsay [[id number]]  Keniah Klemmer, [place], [place] [[id number]]  Joanne Gavel Date of discharge: 10/28/2022  Admitting diagnosis: Twin pregnancy, twins concordant in third trimester [O30.003] Intrauterine pregnancy: [redacted]w[redacted]d     Secondary diagnosis:  Principal Problem:   Twin pregnancy, twins concordant in third trimester Active Problems:   Supervision of high-risk pregnancy   Dichorionic diamniotic twin pregnancy   Abnormal chromosomal and genetic finding on antenatal screening mother   Anemia affecting pregnancy in third trimester  Additional problems: ***    Discharge diagnosis: Term Pregnancy Delivered                                              Post partum procedures:*** Augmentation: AROM and Pitocin Complications: breech extraction twin B  Hospital course: Induction of Labor With Vaginal Delivery   27 y.o. yo [phone] at [redacted]w[redacted]d was admitted to the hospital 10/27/2022 for induction of labor. Indication for induction:  di-di twins .  Patient had an labor course complicated by breech extraction of twin B. Membrane Rupture Time/Date:    Tahmina, Frangella [[id number]]  1:32 PM    Sadiah, Baez [place] [[id number]]  4:48 PM,   Santia, Hartell [[id number]]  10/28/2022    Deann, Russo [[id number]]  10/28/2022  Delivery Method:   Leonel Ramsay [[id number]]  Vaginal, Spontaneous    Pricilla, Borowy [[id number]]  Vaginal, Breech Operative Delivery:{Operative Delivery:30121} Episiotomy:    Dezyrae, Luebbert [[id number]]  None    Jaynelle, Kreifels [place] [[id number]]  None Lacerations:     Ayerim, Blancher [[id number]]  None    Samanda, Delker [[id number]]  None Details of delivery can be found in separate delivery note.  Patient had a postpartum course complicated by***. Patient is discharged home 10/28/22.  Newborn Data: Birth date:   Mlynn, Weinkauf [[id number]]  10/28/2022    Tawn, Buroker [[id number]]  10/28/2022 Birth time:   Annakay, Jasmin [[id number]]  4:22 PM    Joie Bimler [[id number]]  4:49 PM Gender:   Natajah, Rybinski [[id number]]  Female    Elnita, Ricotta Lemitar [[id number]]  Female Living status:   Ricardo, Mairs [[id number]]  Living    Smita, Haugh [place] [[id number]]  Living Apgars:   Myli, [place] [[id number]]  [address] [place] [[id number]]  8 ,   Akiya, Kadrmas [[id number]]  [address] [place] [[id number]]  9  Weight:   Bethel, Kinoshita [[id number]]  3040 g    Anye, Wayner [[id number]]  2970 g  Magnesium Sulfate received: No BMZ received: No Rhophylac:No MMR:No T-DaP:Given prenatally Flu: No Transfusion:No  Physical exam  Vitals:   10/28/22 1700 10/28/22 1715 10/28/22 1730 10/28/22 1745  BP: 134/82 97/84 113/87 118/77  Pulse: 86 81 (!) 244 74  Resp:      Temp:      TempSrc:      SpO2:      Weight:      Height:  General: {Exam; general:21111117} Lochia: {Desc; appropriate/inappropriate:30686::"appropriate"} Uterine Fundus: {Desc; firm/soft:30687} Incision: {Exam; incision:21111123} DVT Evaluation: {Exam; ONG:2952841} Labs: Lab Results  Component Value Date   WBC 9.0 10/28/2022   HGB 10.2 (L) 10/28/2022   HCT 31.6 (L) 10/28/2022   MCV 80.0 10/28/2022   PLT 275 10/28/2022      Latest Ref Rng & Units 10/28/2022    1:00 AM  CMP  Creatinine 0.44 - 1.00 mg/dL 3.24    Edinburgh Score:    09/17/2020   11:20 AM  Edinburgh Postnatal Depression Scale Screening Tool  I have been able to laugh and see the funny side of things. 0  I have looked forward with enjoyment to things. 0  I have blamed  myself unnecessarily when things went wrong. 1  I have been anxious or worried for no good reason. 1  I have felt scared or panicky for no good reason. 0  Things have been getting on top of me. 2  I have been so unhappy that I have had difficulty sleeping. 2  I have felt sad or miserable. 1  I have been so unhappy that I have been crying. 1  The thought of harming myself has occurred to me. 0  Edinburgh Postnatal Depression Scale Total 8     After visit meds:  Allergies as of 10/28/2022       Reactions   Amoxicillin Hives   Ampicillin Hives   Penicillins Hives   Has patient had a PCN reaction causing immediate rash, facial/tongue/throat swelling, SOB or lightheadedness with hypotension: Yes Has patient had a PCN reaction causing severe rash involving mucus membranes or skin necrosis: No Has patient had a PCN reaction that required hospitalization No Has patient had a PCN reaction occurring within the last 10 years: unknown If all of the above answers are "NO", then may proceed with Cephalosporin use.     Med Rec must be completed prior to using this [facility]***        Discharge home in stable condition Infant Feeding: {Baby feeding:23562} Infant Disposition:{CHL IP OB HOME WITH MWNUUV:25366} Discharge instruction: per After Visit Summary and Postpartum booklet. Activity: Advance as tolerated. Pelvic rest for 6 weeks.  Diet: {OB YQIH:47425956} Future Appointments: Future Appointments  Date Time Provider Department Center  03/29/2023  1:00 PM Anabel Halon, MD [facility]   Follow up Visit:  Message sent to FT 9/6  Please schedule this patient for a In person postpartum visit in 6 weeks with the following provider: Any provider. Additional Postpartum F/U: none   High risk pregnancy complicated by:  di-di twins Delivery mode:     Jenniyah, Jasperson [[id number]]  Vaginal, Spontaneous    Keneisha, Milosevic [[id number]]  Vaginal, Breech Anticipated Birth Control:    Nexplanon IP   10/28/2022 Joanne Gavel, MD

## 2022-10-28 NOTE — H&P (Signed)
Christina Conley is a 27 y.o. female [phone] with IUP at [redacted]w[redacted]d presenting for IOL for DC/DA twins. [facility] at [facility]  Prenatal History/Complications: Term SVD 2016 SVD 36.1 weeks twins 2022 Early SAB  Iron Deficiency anemia--received Venofer  PTL @ 30 weeks, received MgS04 and BMZ    Past Medical History: Past Medical History:  Diagnosis Date   Acute blood loss anemia 08/12/2020   EBL with delivery PO iron, IV venofer administered postpartum 1 U pRBCs Patient asymptomatic   Asthma    child.  No meds > 2 years   Environmental allergies    Gonorrhea 08/2015   Irregular intermenstrual bleeding 05/27/2015   Marginal insertion of umbilical cord affecting management of mother    Baby B    Past Surgical History: Past Surgical History:  Procedure Laterality Date   NO PAST SURGERIES      Obstetrical History: OB History     Gravida  4   Para  2   Term  1   Preterm  1   AB  1   Living  3      SAB  1   IAB      Ectopic      Multiple  1   Live Births  3           Social History: Social History   Socioeconomic History   Marital status: Single    Spouse name: Not on file   Number of children: Not on file   Years of education: Not on file   Highest education level: Not on file  Occupational History   Not on file  Tobacco Use   Smoking status: Former    Current packs/day: 0.25    Average packs/day: 0.3 packs/day for 0.5 years (0.1 ttl pk-yrs)    Types: Cigarettes   Smokeless tobacco: Never  Vaping Use   Vaping status: Never Used  Substance and Sexual Activity   Alcohol use: Not Currently    Comment: occ   Drug use: Not Currently    Types: Marijuana   Sexual activity: Not Currently    Birth control/protection: None  Other Topics Concern   Not on file  Social History Narrative   Not on file   Social Determinants of Health   Financial Resource Strain: Low Risk  (05/03/2022)   Overall Financial Resource Strain (CARDIA)     Difficulty of Paying Living Expenses: Not hard at all  Food Insecurity: No Food Insecurity (10/28/2022)   Hunger Vital Sign    Worried About Running Out of Food in the Last Year: Never true    Ran Out of Food in the Last Year: Never true  Transportation Needs: No Transportation Needs (10/28/2022)   PRAPARE - Administrator, Civil Service (Medical): No    Lack of Transportation (Non-Medical): No  Physical Activity: Insufficiently Active (05/03/2022)   Exercise Vital Sign    Days of Exercise per Week: 2 days    Minutes of Exercise per Session: 10 min  Stress: Stress Concern Present (05/03/2022)   [employer] of Occupational Health - Occupational Stress Questionnaire    Feeling of Stress : To some extent  Social Connections: Moderately Integrated (05/03/2022)   Social Connection and Isolation Panel [NHANES]    Frequency of Communication with Friends and Family: More than three times a week    Frequency of Social Gatherings with Friends and Family: Twice a week    Attends Religious Services: More than 4  times per year    Active Member of Clubs or Organizations: Yes    Attends Engineer, structural: More than 4 times per year    Marital Status: Never married    Family History: Family History  Problem Relation Age of Onset   Diabetes Mother    Crohn's disease Mother    Other Father        problems with intestines   Other Son        absent right testicle   Hypertension Maternal Grandmother    Miscarriages / Stillbirths Maternal Grandmother    Hypertension Maternal Grandfather    Hyperlipidemia Maternal Grandfather    Crohn's disease Paternal Grandmother     Allergies: Allergies  Allergen Reactions   Amoxicillin Hives   Ampicillin Hives   Penicillins Hives    Has patient had a PCN reaction causing immediate rash, facial/tongue/throat swelling, SOB or lightheadedness with hypotension: Yes Has patient had a PCN reaction causing severe rash involving mucus  membranes or skin necrosis: No Has patient had a PCN reaction that required hospitalization No Has patient had a PCN reaction occurring within the last 10 years: unknown If all of the above answers are "NO", then may proceed with Cephalosporin use.     Medications Prior to Admission  Medication Sig Dispense Refill Last Dose   aspirin EC 81 MG tablet Take 1 tablet (81 mg total) by mouth daily. Swallow whole. 90 tablet 3 Past Month   Blood Pressure Monitor MISC For regular home bp monitoring during pregnancy 1 each 0 Past Month   calcium carbonate (TUMS EX) 750 MG chewable tablet Chew 1 tablet by mouth daily.   Past Month   ferrous sulfate 325 (65 FE) MG EC tablet Take 325 mg by mouth 3 (three) times daily with meals.   Past Month   NIFEdipine (PROCARDIA) 10 MG capsule Take 1 capsule (10 mg total) by mouth 3 (three) times daily. 90 capsule 1 Past Month   pantoprazole (PROTONIX) 20 MG tablet Take 1 tablet (20 mg total) by mouth daily. 30 tablet 1 Past Month   prenatal vitamin w/FE, FA (PRENATAL 1 + 1) 27-1 MG TABS tablet Take 1 tablet by mouth daily at 12 noon. 30 tablet 12 Past Month        Review of Systems   Constitutional: Negative for fever and chills Eyes: Negative for visual disturbances Respiratory: Negative for shortness of breath, dyspnea Cardiovascular: Negative for chest pain or palpitations  Gastrointestinal: Negative for abdominal pain, vomiting, diarrhea and constipation.   Genitourinary: Negative for dysuria and urgency Musculoskeletal: Negative for back pain, joint pain, myalgias  Neurological: Negative for dizziness and headaches      Blood pressure 117/72, pulse 88, temperature 97.8 F (36.6 C), resp. rate 18, height 5\' 11"  (1.803 m), weight 112.9 kg, last menstrual period 01/28/2022, SpO2 100%. General appearance: alert, cooperative, and no distress Lungs: normal respiratory effort Heart: regular rate and rhythm Abdomen: soft, non-tender; bowel sounds  normal Extremities: Homans sign is negative, no sign of DVT DTR's 2+ Presentation: vtx/vtx Fetal monitoring  Baseline: 130s X2 bpm, Variability: Good {> 6 bpm), Accelerations: Reactive, and Decelerations: Absent Uterine activity  mild and irregular    Cx 4.5/70/-1  Vtx/Vtx confirmed w/BS [place]  Prenatal labs: ABO, Rh: --/--/PENDING (09/06 0040) Antibody: PENDING (09/06 0040) Rubella: immune RPR: Non Reactive (06/25 0848)  HBsAg: Negative (03/12 1203)  HIV: Non Reactive (06/25 0848)  GBS: --[name] (08/15 1400)   NURSING  PROVIDER  Office  Location [facility] Dating by U/S at 7 wks  [facility] Model Traditional Anatomy U/S Nl female x 1 A: 'Layla'  B: 'Ava'-marginal/velamentous cord  Initiated care at  [employer]  English              LAB RESULTS   Support Person  Genetics NIPS: LR female x 2                          NT/IT (FT only) + OSB, referred to MFM/GC    Carrier Screen Horizon: neg  Rhogam  --/--/O POS (07/13 1518) A1C/GTT Early: 5.3         Third trimester: wnl  Flu Vaccine     TDaP Vaccine  09/29/22 Blood Type --/--/O POS (07/13 1518)  Covid Vaccine  Antibody NEG (07/13 1518)    Rubella 12.50 (03/12 1203)  Feeding Plan both RPR Non Reactive (06/25 0848)  Contraception Nexp @ [facility] HBsAg Negative (03/12 1203)  Circumcision N/a HIV Non Reactive (06/25 0848)  Pediatrician  [facility] HCVAb Non Reactive (03/12 1203)  Prenatal Classes discussed      Pap Diagnosis  Date Value Ref Range Status  09/17/2020   Final   - Negative for intraepithelial lesion or malignancy (NILM)    BTLConsent  GC/CT Initial:             36wks:  VBAC  Consent  GBS --/Positive (08/15 1400)POSITIVE For PCN allergy, check sensitivities        DME Rx [ ]  BP cuff [ ]  Weight Scale Waterbirth  [ ]  Class [ ]  Consent [ ]  CNM visit  PHQ9 & GAD7 [  ] new OB [  ] 28 weeks  [  ] 36 weeks Induction  [ ]  Orders Entered [ ] Foley Y/N      Prenatal Transfer Tool  Maternal Diabetes:  No Genetic Screening: Abnormal:  Results: Elevated AFP Maternal Ultrasounds/Referrals: Normal Fetal Ultrasounds or other Referrals:  Referred to Materal Fetal Medicine  Maternal Substance Abuse:  No Significant Maternal Medications:  None Significant Maternal Lab Results: Group B Strep positive    Results for orders placed or performed during the hospital encounter of 10/27/22 (from the past 24 hour(s))  Type and screen   Collection Time: 10/28/22 12:40 AM  Result Value Ref Range   ABO/RH(D) PENDING    Antibody Screen PENDING    Sample Expiration      10/31/2022,2359 Performed at [facility], [address], [place], [place] [address]     Assessment: Christina Conley is a 27 y.o. [phone] with an IUP at [redacted]w[redacted]d presenting for IOL for DC/DA twins.  Plan: #Labor: start pitocin 2 hours after 1st dose of ABX complete.  Plan AROM 2 hours after that Plan TXA after delivery (hx pph) #Pain:  Per request #FWB Cat 1 #ID: GBS: Vancomycin 2gm q 8 hrs (lab couldn't/didn't run sensitivities)  #MOF:  both #MOC: inpt nexplanon  Jacklyn Shell 10/28/2022, 1:21 AM

## 2022-10-28 NOTE — Anesthesia Procedure Notes (Signed)
Epidural Patient location during procedure: OB Start time: 10/28/2022 12:20 PM End time: 10/28/2022 12:26 PM  Staffing Anesthesiologist: Newport Nation, MD Performed: anesthesiologist   Preanesthetic Checklist Completed: patient identified, IV checked, risks and benefits discussed, monitors and equipment checked, pre-op evaluation and timeout performed  Epidural Patient position: sitting Prep: DuraPrep Patient monitoring: heart rate, cardiac monitor, continuous pulse ox and blood pressure Approach: midline Location: L3-L4 Injection technique: LOR air  Needle:  Needle type: Tuohy  Needle gauge: 17 G Needle length: 9 cm Catheter type: closed end flexible Catheter size: 19 Gauge Test dose: negative  Assessment Sensory level: T8  Additional Notes Patient identified. Risks/Benefits/Options discussed with patient including but not limited to bleeding, infection, nerve damage, paralysis, failed block, incomplete pain control, headache, blood pressure changes, nausea, vomiting, reactions to medication both or allergic, itching and postpartum back pain. Confirmed with bedside nurse the patient's most recent platelet count. Confirmed with patient that they are not currently taking any anticoagulation, have any bleeding history or any family history of bleeding disorders. Patient expressed understanding and wished to proceed. All questions were answered. Sterile technique was used throughout the entire procedure. Please see nursing notes for vital signs. Test dose was given through epidural catheter and negative prior to continuing to dose epidural or start infusion. Warning signs of high block given to the patient including shortness of breath, tingling/numbness in hands, complete motor block, or any concerning symptoms with instructions to call for help. Patient was given instructions on fall risk and not to get out of bed. All questions and concerns addressed with instructions to call with any  issues or inadequate analgesia.  Reason for block:procedure for pain

## 2022-10-29 MED ORDER — ETONOGESTREL 68 MG ~~LOC~~ IMPL
68.0000 mg | DRUG_IMPLANT | Freq: Once | SUBCUTANEOUS | Status: AC
  Administered 2022-10-30: 68 mg via SUBCUTANEOUS
  Filled 2022-10-29: qty 1

## 2022-10-29 MED ORDER — LIDOCAINE HCL 1 % IJ SOLN
0.0000 mL | Freq: Once | INTRAMUSCULAR | Status: AC | PRN
  Administered 2022-10-30: 20 mL via INTRADERMAL

## 2022-10-29 NOTE — Progress Notes (Signed)
Post Partum Day #1 Subjective: no complaints, up ad lib, and tolerating PO; breast and bottlefeeding the twins; plans inpatient Nexplanon placement- R&Bs reviewed  Objective: Blood pressure 105/69, pulse 77, temperature 98.2 F (36.8 C), temperature source Oral, resp. rate 16, height 5\' 11"  (1.803 m), weight 112.9 kg, last menstrual period 01/28/2022, SpO2 99%, unknown if currently breastfeeding.  Physical Exam:  General: alert, cooperative, and no distress Lochia: appropriate Uterine Fundus: firm DVT Evaluation: No evidence of DVT seen on physical exam.  Recent Labs    10/28/22 0100  HGB 10.2*  HCT 31.6*    Assessment/Plan: Plan for discharge tomorrow and Contraception : Nex ordered; to be placed later today if possible or tomorrow   LOS: 2 days   Christina Conley, CNM 10/29/2022, 7:11 AM

## 2022-10-29 NOTE — Anesthesia Postprocedure Evaluation (Signed)
Anesthesia Post Note  Patient: Christina Conley  Procedure(s) Performed: AN AD HOC LABOR EPIDURAL     Patient location during evaluation: Mother Baby Anesthesia Type: Epidural Level of consciousness: awake and alert Pain management: pain level controlled Vital Signs Assessment: post-procedure vital signs reviewed and stable Respiratory status: spontaneous breathing, nonlabored ventilation and respiratory function stable Cardiovascular status: stable Postop Assessment: no headache, no backache and epidural receding Anesthetic complications: no   No notable events documented.  Last Vitals:  Vitals:   10/29/22 0045 10/29/22 0512  BP: 111/71 105/69  Pulse: 85 77  Resp: 16 16  Temp: 36.9 C 36.8 C  SpO2: 99% 99%    Last Pain:  Vitals:   10/29/22 0512  TempSrc: Oral  PainSc:    Pain Goal: Patients Stated Pain Goal: 0 (10/27/22 2137)                 Rica Records

## 2022-10-29 NOTE — Lactation Note (Signed)
This note was copied from a baby's chart. Lactation Consultation Note  Patient Name: Christina Conley [id number] Date: 10/29/2022 Age:26 hours  Reason for consult: Follow-up assessment;Early term 37-38.6wks;Multiple gestation  P5, [redacted]w[redacted]d, twin girls Twin A "Lela"-3% weight loss Twin B "Ava" - 3% weight loss  This is mother's 2nd set of twins (she has 42 year old twins boys and 32 year old daughter). Mother is very calm and experienced with feeding twins. She was in the process of feeding Twin A upon my arrival.  She is breast and formula feeding. She says Twin B "Ava" breast feeds very good and Twin A "Lela" has not been interested in breastfeeding. Mother attempts to breastfeed first then supplements with formula.   Mother requested a manual pump and reports she was able to express best with the manual pump after her last set of twins. Mother reports a history of having a large milk supply and fed for 4 months until returning to work.     Maternal Data Has patient been taught Hand Expression?: Yes Does the patient have breastfeeding experience prior to this delivery?: Yes How long did the patient breastfeed?: 4 months  Feeding Mother's Current Feeding Choice: Breast Milk and Formula Nipple Type: Slow - flow  Interventions Interventions: Education;Hand pump  Discharge Pump: Hands Free;Personal;Manual  Consult Status Consult Status: Follow-up Date: 10/30/22 Follow-up type: In-patient    Christina Conley 10/29/2022, 6:06 PM

## 2022-10-30 DIAGNOSIS — Z30017 Encounter for initial prescription of implantable subdermal contraceptive: Secondary | ICD-10-CM

## 2022-10-30 MED ORDER — ACETAMINOPHEN 325 MG PO TABS: 650.0000 mg | ORAL_TABLET | Freq: Four times a day (QID) | ORAL | 0 refills | Status: DC | PRN

## 2022-10-30 MED ORDER — SENNOSIDES-DOCUSATE SODIUM 8.6-50 MG PO TABS: 2.0000 | ORAL_TABLET | Freq: Two times a day (BID) | ORAL | 1 refills | Status: DC | PRN

## 2022-10-30 MED ORDER — IBUPROFEN 600 MG PO TABS: 600.0000 mg | ORAL_TABLET | Freq: Four times a day (QID) | ORAL | 0 refills | Status: DC

## 2022-10-30 NOTE — Clinical Social Work Maternal (Signed)
  CLINICAL SOCIAL WORK MATERNAL/CHILD NOTE  Patient Details  Name: Christina Conley MRN: [id number] Date of Birth: 07/02/1995  Date:  10/30/2022  Clinical Social Worker Initiating Note:  Jimmy Picket, [place] Date/Time: Initiated:  10/30/22/1115     Child's Name:  Christina Conley, Twin B- Christina Conley   Biological Parents:  Mother   Need for Interpreter:  None   Reason for Referral:  Behavioral Health Concerns, Other (Comment) (Hx of MDD)   Address:  [address] [place] [address]    Phone number:  [phone] (home)     Additional phone number:   Household Members/Support Persons (HM/SP):   Household Member/Support Person 1   HM/SP Name Relationship DOB or Age  HM/SP -1 Melina Fiddler Mother    HM/SP -2        HM/SP -3        HM/SP -4        HM/SP -5        HM/SP -6        HM/SP -7        HM/SP -8          Natural Supports (not living in the home):  [employer], Immediate Family   Professional Supports: None   Employment: Unemployed   Type of Work:     Education:  Attending college   Homebound arranged:    Surveyor, quantity Resources:  Medicaid   Other Resources:  Sales executive  , WIC   Cultural/Religious Considerations Which May Impact Care:    Strengths:  Ability to meet basic needs  , Merchandiser, retail, Home prepared for child     Psychotropic Medications:         Pediatrician:    [facility] area  Optometrist List:   [facility]  [place]    [place]    [place] [facility]      Pediatrician Fax Number:    Risk Factors/Current Problems:  None   Cognitive State:  Goal Oriented  , Insightful  , Linear Thinking     Mood/Affect:  Calm  , Relaxed     CSW Assessment: CSW met with MOB and Grandmother at bedside. CSW received consult for hx of MDD. CSW met with MOB to offer support and complete assessment.    CSW provided education regarding the baby blues period vs.  perinatal mood disorders, discussed treatment and gave resources for mental health follow up if concerns arise.  CSW recommends self-evaluation during the postpartum time period using the New Mom Checklist from Postpartum Progress and encouraged MOB to contact a medical professional if symptoms are noted at any time.    CSW provided review of Sudden Infant Death Syndrome (SIDS) precautions.   CSW identifies no further need for intervention and no barriers to discharge at this time.   CSW Plan/Description:  No Further Intervention Required/No Barriers to Discharge    Jimmy Picket, LCSW 10/30/2022, 11:21 AM

## 2022-10-30 NOTE — Lactation Note (Signed)
This note was copied from a baby's chart. Lactation Consultation Note  Patient Name: Christina Conley [id number] Date: 10/30/2022 Age:26 hours Reason for consult: Follow-up assessment;Early term 37-38.6wks;Multiple gestation  P5- MOB states that infant girl A is nursing much better than before and infant girl B is still nursing well. MOB states feeding plan is to breastfeed infant's separately, formula feed, and pump with a hands free pump.   MOB denies questions or concerns at this moment.  LC reviewed LC services handout and encouraged her to call lactation for further assistance as needed.  Maternal Data Does the patient have breastfeeding experience prior to this delivery?: Yes How long did the patient breastfeed?: 4 months for her first set of twins  Feeding Mother's Current Feeding Choice: Breast Milk and Formula  Interventions Interventions: Breast feeding basics reviewed;Education;Pace feeding;LC Services brochure  Discharge Discharge Education: Engorgement and breast care;Warning signs for feeding baby Pump: Hands Free;Personal (Encourage MOB to use a DEBP for strong stimulation.)  Consult Status Consult Status: Complete Date: 10/30/22    Dema Severin BS, IBCLC 10/30/2022, 12:19 PM

## 2022-10-30 NOTE — Procedures (Signed)
Post-Placental Nexplanon Insertion Procedure Note  Patient was identified. Informed consent was signed, signed copy in chart. A time-out was performed.    The insertion site was identified 8-10 cm (3-4 inches) from the medial epicondyle of the humerus and 3-5 cm (1.25-2 inches) posterior to (below) the sulcus (groove) between the biceps and triceps muscles of the patient's L arm and marked. The site was prepped and draped in the usual sterile fashion. Pt was prepped with alcohol swab and then injected with 3 cc of 1% lidocaine. The site was prepped with betadine. Nexplanon removed form packaging,  Device confirmed in needle, then inserted full length of needle and withdrawn per handbook instructions. Provider and patient verified presence of the implant in the woman's arm by palpation. Pt insertion site was covered with steristrips/adhesive bandage and pressure bandage. There was minimal blood loss. Patient tolerated procedure well.  Patient was given post procedure instructions and Nexplanon user card with expiration date. Condoms were recommended for STI prevention. Patient was asked to keep the pressure dressing on for 24 hours to minimize bruising and keep the adhesive bandage on for 3-5 days. The patient verbalized understanding of the plan of care and agrees.   Joanne Gavel, MD

## 2022-11-29 ENCOUNTER — Telehealth (HOSPITAL_COMMUNITY): Payer: Self-pay

## 2022-11-29 NOTE — Telephone Encounter (Signed)
11/29/2022 1542  Name: Christina Conley MRN: [id number] DOB: Oct 31, 1995  Reason for Call:  Transition of Care Hospital Discharge Call  Contact Status: Patient Contact Status: Complete  Language assistant needed: Interpreter Mode: Interpreter Not Needed        Follow-Up Questions: Do You Have Any Concerns About Your Health As You Heal From Delivery?: No Do You Have Any Concerns About Your Infants Health?: No  Edinburgh Postnatal Depression Scale:  In the Past 7 Days:    PHQ2-9 Depression Scale:     Discharge Follow-up: Edinburgh score requires follow up?:  (Patient refuses EPDS. She states that she is doing fine emotionally.)  Post-discharge interventions: Reviewed Newborn Safe Sleep Practices  Signature  Signe Colt

## 2022-12-07 ENCOUNTER — Ambulatory Visit (INDEPENDENT_AMBULATORY_CARE_PROVIDER_SITE_OTHER): Payer: Medicaid Other | Admitting: Women's Health

## 2022-12-07 ENCOUNTER — Encounter: Payer: Self-pay | Admitting: Women's Health

## 2022-12-07 DIAGNOSIS — Z30017 Encounter for initial prescription of implantable subdermal contraceptive: Secondary | ICD-10-CM | POA: Insufficient documentation

## 2022-12-07 NOTE — Progress Notes (Signed)
POSTPARTUM VISIT Patient name: Christina Conley MRN [id number]  Date of birth: [birth year] Chief Complaint:   Postpartum Care  History of Present Illness:   Christina Conley is a 27 y.o. [phone] African American female being seen today for a postpartum visit. She is 5 weeks postpartum following a spontaneous vaginal delivery of DCDA twins at 38.1 gestational weeks, A vtx, B breech extraction. IOL: yes, for multiple gestation . Anesthesia: epidural.  Laceration: none.  Complications: none. Inpatient contraception: yes Nexplanon inserted 10/30/22 .   Pregnancy complicated by DCDA twins, marginal/velamentous cord on baby B, anemia requiring IV Fe . Tobacco use: former . Substance use disorder: no. Last pap smear: 08/28/20 and results were NILM w/ HRHPV not done. Next pap smear due: 2025 Patient's last menstrual period was 01/28/2022 (exact date).  Postpartum course has been uncomplicated. Bleeding spotting. Bowel function is normal. Bladder function is normal. Urinary incontinence? no, fecal incontinence? no Patient is sexually active. Last sexual activity:  did not discuss . Desired contraception: Nexplanon placed at hospital  . Patient does not want a pregnancy in the future.  Desired family size is 5 children.   Upstream - 12/07/22 1047       Pregnancy Intention Screening   Does the patient want to become pregnant in the next year? No    Does the patient's partner want to become pregnant in the next year? No    Would the patient like to discuss contraceptive options today? No      Contraception Wrap Up   Current Method Hormonal Implant    End Method Hormonal Implant    Contraception Counseling Provided No            The pregnancy intention screening data noted above was reviewed. Potential methods of contraception were discussed. The patient elected to proceed with Hormonal Implant.  Edinburgh Postpartum Depression Screening: negative  Edinburgh Postnatal Depression Scale - 12/07/22 1046        Edinburgh Postnatal Depression Scale:  In the Past 7 Days   I have been able to laugh and see the funny side of things. 0    I have looked forward with enjoyment to things. 0    I have blamed myself unnecessarily when things went wrong. 0    I have been anxious or worried for no good reason. 2    I have felt scared or panicky for no good reason. 0    Things have been getting on top of me. 0    I have been so unhappy that I have had difficulty sleeping. 0    I have felt sad or miserable. 0    The thought of harming myself has occurred to me. 0                05/03/2022   10:21 AM 02/03/2021   10:19 AM 02/26/2020    2:56 PM 01/14/2020    9:48 AM  GAD 7 : Generalized Anxiety Score  Nervous, Anxious, on Edge 2 1 1 2   Control/stop worrying 0 1 2 1   Worry too much - different things 0 1 2 3   Trouble relaxing 0 0 3 1  Restless 0 0 0 0  Easily annoyed or irritable 3 1 2 3   Afraid - awful might happen 0 0 0 2  Total GAD 7 Score 5 4 10 12   Anxiety Difficulty  Somewhat difficult       Babies courses have been uncomplicated. Babies is feeding by  breast and bottle: milk supply adequate. Infant has a pediatrician/family doctor? Yes.  Childcare strategy if returning to work/school: n/a-working from home.  Pt has material needs met for her and baby: Yes.   Review of Systems:   Pertinent items are noted in HPI Denies Abnormal vaginal discharge w/ itching/odor/irritation, headaches, visual changes, shortness of breath, chest pain, abdominal pain, severe nausea/vomiting, or problems with urination or bowel movements. Pertinent History Reviewed:  Reviewed past medical,surgical, obstetrical and family history.  Reviewed problem list, medications and allergies. OB History  Gravida Para Term Preterm AB Living  4 3 2 1 1 5   SAB IAB Ectopic Multiple Live Births  1     2 5     # Outcome Date GA Lbr Len/2nd Weight Sex Type Anes PTL Lv  4A Term 10/28/22 [redacted]w[redacted]d 02:38 / 00:12 6 lb 11.2 oz (3.04  kg) F Vag-Spont EPI  LIV     Birth Comments: WNL  4B Term 10/28/22 [redacted]w[redacted]d 02:38 / 00:39 6 lb 8.8 oz (2.97 kg) F Vag-Breech EPI  LIV  3A Preterm 08/11/20 [redacted]w[redacted]d 02:04 / 00:27 5 lb 13.3 oz (2.645 kg) M Vag-Spont EPI  LIV  3B Preterm 08/11/20 [redacted]w[redacted]d 02:04 / 00:36 5 lb 6.4 oz (2.449 kg) M Vag-Spont EPI  LIV  2 Term 07/10/14 [redacted]w[redacted]d 10:07 / 00:18 7 lb 11.6 oz (3.505 kg) F Vag-Spont EPI  LIV  1 SAB            Physical Assessment:   Vitals:   12/07/22 1039  BP: 113/76  Pulse: 77  Weight: 226 lb (102.5 kg)  Height: 5\' 11"  (1.803 m)  Body mass index is 31.52 kg/m.       Physical Examination:   General appearance: alert, well appearing, and in no distress  Mental status: alert, oriented to person, place, and time  Skin: warm & dry   Cardiovascular: normal heart rate noted   Respiratory: normal respiratory effort, no distress   Breasts: deferred, no complaints   Abdomen: soft, non-tender   Pelvic: examination not indicated. Thin prep pap obtained: No  Rectal: not examined  Extremities: Edema: none   Chaperone: N/A         No results found for this or any previous visit (from the past 24 hour(s)).  Assessment & Plan:  1) Postpartum exam 2) 5 wks s/p spontaneous vaginal delivery after IOL for DCDA twins 3) breast & bottle feeding 4) Depression screening 5) Contraception s/p Nexplanon 10/30/22  Essential components of care per ACOG recommendations:  1.  Mood and well being:  If positive depression screen, discussed and plan developed.  If using tobacco we discussed reduction/cessation and risk of relapse If current substance abuse, we discussed and referral to local resources was offered.   2. Infant care and feeding:  If breastfeeding, discussed returning to work, pumping, breastfeeding-associated pain, guidance regarding return to fertility while lactating if not using another method. If needed, patient was provided with a letter to be allowed to pump q 2-3hrs to support lactation in a  private location with access to a refrigerator to store breastmilk.   Recommended that all caregivers be immunized for flu, pertussis and other preventable communicable diseases If pt does not have material needs met for her/baby, referred to local resources for help obtaining these.  3. Sexuality, contraception and birth spacing Provided guidance regarding sexuality, management of dyspareunia, and resumption of intercourse Discussed avoiding interpregnancy interval <49mths and recommended birth spacing of 18 months  4. Sleep and  fatigue Discussed coping options for fatigue and sleep disruption Encouraged family/partner/community support of 4 hrs of uninterrupted sleep to help with mood and fatigue  5. Physical recovery  If pt had a C/S, assessed incisional pain and providing guidance on normal vs prolonged recovery If pt had a laceration, perineal healing and pain reviewed.  If urinary or fecal incontinence, discussed management and referred to PT or uro/gyn if indicated  Patient is safe to resume physical activity. Discussed attainment of healthy weight.  6.  Chronic disease management Discussed pregnancy complications if any, and their implications for future childbearing and long-term maternal health. Review recommendations for prevention of recurrent pregnancy complications, such as 17 hydroxyprogesterone caproate to reduce risk for recurrent PTB not applicable, or aspirin to reduce risk of preeclampsia not applicable. Pt had GDM: no. If yes, 2hr GTT scheduled: not applicable. Reviewed medications and non-pregnant dosing including consideration of whether pt is breastfeeding using a reliable resource such as LactMed: yes Referred for f/u w/ PCP or subspecialist providers as indicated: not applicable  7. Health maintenance Mammogram at 26yo or earlier if indicated Pap smears as indicated  Meds: No orders of the defined types were placed in this encounter.   Follow-up: Return in  about 1 year (around 12/07/2023) for Pap & physical.   No orders of the defined types were placed in this encounter.   Cheral Marker CNM, [facility] 12/07/2022 11:14 AM

## 2022-12-26 ENCOUNTER — Other Ambulatory Visit: Payer: Self-pay | Admitting: Women's Health

## 2022-12-26 ENCOUNTER — Encounter: Payer: Self-pay | Admitting: Women's Health

## 2022-12-26 MED ORDER — MEGESTROL ACETATE 40 MG PO TABS: ORAL_TABLET | ORAL | 1 refills | Status: DC

## 2023-02-01 ENCOUNTER — Encounter: Payer: Self-pay | Admitting: Women's Health

## 2023-02-23 DIAGNOSIS — F411 Generalized anxiety disorder: Secondary | ICD-10-CM | POA: Diagnosis not present

## 2023-02-23 DIAGNOSIS — F331 Major depressive disorder, recurrent, moderate: Secondary | ICD-10-CM | POA: Diagnosis not present

## 2023-03-03 DIAGNOSIS — F411 Generalized anxiety disorder: Secondary | ICD-10-CM | POA: Diagnosis not present

## 2023-03-03 DIAGNOSIS — F331 Major depressive disorder, recurrent, moderate: Secondary | ICD-10-CM | POA: Diagnosis not present

## 2023-03-06 ENCOUNTER — Other Ambulatory Visit: Payer: Self-pay | Admitting: Women's Health

## 2023-03-06 ENCOUNTER — Encounter: Payer: Self-pay | Admitting: Women's Health

## 2023-03-06 MED ORDER — MEGESTROL ACETATE 40 MG PO TABS: ORAL_TABLET | ORAL | 1 refills | Status: DC

## 2023-03-09 DIAGNOSIS — F411 Generalized anxiety disorder: Secondary | ICD-10-CM | POA: Diagnosis not present

## 2023-03-09 DIAGNOSIS — F331 Major depressive disorder, recurrent, moderate: Secondary | ICD-10-CM | POA: Diagnosis not present

## 2023-03-28 ENCOUNTER — Other Ambulatory Visit: Payer: Self-pay | Admitting: Women's Health

## 2023-03-28 MED ORDER — IBUPROFEN 600 MG PO TABS: 600.0000 mg | ORAL_TABLET | Freq: Four times a day (QID) | ORAL | 0 refills | Status: DC | PRN

## 2023-03-29 ENCOUNTER — Other Ambulatory Visit (HOSPITAL_COMMUNITY)
Admission: RE | Admit: 2023-03-29 | Discharge: 2023-03-29 | Disposition: A | Discharge status: Home / Self Care | Payer: Medicaid Other | Source: Ambulatory Visit | Attending: Obstetrics & Gynecology | Admitting: Obstetrics & Gynecology

## 2023-03-29 ENCOUNTER — Other Ambulatory Visit: Payer: Medicaid Other

## 2023-03-29 ENCOUNTER — Telehealth: Payer: Self-pay

## 2023-03-29 ENCOUNTER — Encounter: Payer: Self-pay | Admitting: Internal Medicine

## 2023-03-29 ENCOUNTER — Ambulatory Visit (INDEPENDENT_AMBULATORY_CARE_PROVIDER_SITE_OTHER): Payer: Medicaid Other | Admitting: Internal Medicine

## 2023-03-29 ENCOUNTER — Encounter: Payer: Self-pay | Admitting: Obstetrics & Gynecology

## 2023-03-29 VITALS — BP 119/78 | HR 95 | Ht 71.0 in | Wt 251.4 lb

## 2023-03-29 DIAGNOSIS — Z862 Personal history of diseases of the blood and blood-forming organs and certain disorders involving the immune mechanism: Secondary | ICD-10-CM | POA: Diagnosis not present

## 2023-03-29 DIAGNOSIS — F3342 Major depressive disorder, recurrent, in full remission: Secondary | ICD-10-CM | POA: Diagnosis not present

## 2023-03-29 DIAGNOSIS — M7989 Other specified soft tissue disorders: Secondary | ICD-10-CM | POA: Diagnosis not present

## 2023-03-29 DIAGNOSIS — N926 Irregular menstruation, unspecified: Secondary | ICD-10-CM | POA: Diagnosis not present

## 2023-03-29 DIAGNOSIS — E559 Vitamin D deficiency, unspecified: Secondary | ICD-10-CM

## 2023-03-29 DIAGNOSIS — Z0001 Encounter for general adult medical examination with abnormal findings: Secondary | ICD-10-CM

## 2023-03-29 DIAGNOSIS — K429 Umbilical hernia without obstruction or gangrene: Secondary | ICD-10-CM

## 2023-03-29 NOTE — Telephone Encounter (Signed)
 Copied from CRM [id number]. Topic: General - Other >> Mar 29, 2023  1:45 PM Christina Conley wrote: Reason for CRM: Patient had an appointment today and needs dr note sent thru mychart

## 2023-03-29 NOTE — Progress Notes (Signed)
 Established Patient Office Visit  Subjective:  Patient ID: Christina Conley, female    DOB: 1995/11/27  Age: 27 y.o. MRN: [id number]  CC:  Chief Complaint  Patient presents with   Annual Exam    Cpe. Reports sx of hernia has worsened.   Joint Swelling    Pt reports ankle swelling, has worsened since last visit.     HPI Christina Conley is a 28 y.o. female with past medical history of IDA and umbilical hernia who presents for annual physical.  Umbilical hernia/diastases recti: She complains of discomfort around umbilical area.  She has had general surgery evaluation for this in the past and was told about conservative treatment. Denies any umbilical discharge currently.  She does have to lift her babies.  Postpartum depression/MDD: She had twins in 09/24. She denies anhedonia currently. She is undergoing BH therapy currently.  She denies any SI or HI currently.  She has good support from her partner for taking care of her children.  Leg swelling: She reports chronic, intermittent leg swelling.  She reports that she has to sit for prolonged times for her work, mostly computer related work.  Denies any dyspnea, orthopnea or PND.   Past Medical History:  Diagnosis Date   Acute blood loss anemia 08/12/2020   EBL with delivery 607mL PO iron , IV venofer  administered postpartum 1 U pRBCs Patient asymptomatic   Asthma    child.  No meds > 2 years   Environmental allergies    Gonorrhea 08/2015   Irregular intermenstrual bleeding 05/27/2015   Marginal insertion of umbilical cord affecting management of mother    Baby B    Past Surgical History:  Procedure Laterality Date   NO PAST SURGERIES      Family History  Problem Relation Age of Onset   Diabetes Mother    Crohn's disease Mother    Other Father        problems with intestines   Other Son        absent right testicle   Hypertension Maternal Grandmother    Miscarriages / Stillbirths Maternal Grandmother    Hypertension  Maternal Grandfather    Hyperlipidemia Maternal Grandfather    Crohn's disease Paternal Grandmother     Social History   Socioeconomic History   Marital status: Single    Spouse name: Not on file   Number of children: Not on file   Years of education: Not on file   Highest education level: Not on file  Occupational History   Not on file  Tobacco Use   Smoking status: Former    Current packs/day: 0.25    Average packs/day: 0.3 packs/day for 0.5 years (0.1 ttl pk-yrs)    Types: Cigarettes   Smokeless tobacco: Never  Vaping Use   Vaping status: Never Used  Substance and Sexual Activity   Alcohol use: Not Currently    Comment: occ   Drug use: Not Currently    Types: Marijuana   Sexual activity: Not Currently    Birth control/protection: None  Other Topics Concern   Not on file  Social History Narrative   Not on file   Social Drivers of Health   Financial Resource Strain: Low Risk  (05/03/2022)   Overall Financial Resource Strain (CARDIA)    Difficulty of Paying Living Expenses: Not hard at all  Food Insecurity: No Food Insecurity (10/28/2022)   Hunger Vital Sign    Worried About Running Out of Food in the Last Year:  Never true    Ran Out of Food in the Last Year: Never true  Transportation Needs: No Transportation Needs (10/28/2022)   PRAPARE - Administrator, Civil Service (Medical): No    Lack of Transportation (Non-Medical): No  Physical Activity: Insufficiently Active (05/03/2022)   Exercise Vital Sign    Days of Exercise per Week: 2 days    Minutes of Exercise per Session: 10 min  Stress: Stress Concern Present (05/03/2022)   [employer] of Occupational Health - Occupational Stress Questionnaire    Feeling of Stress : To some extent  Social Connections: Moderately Integrated (05/03/2022)   Social Connection and Isolation Panel [NHANES]    Frequency of Communication with Friends and Family: More than three times a week    Frequency of Social  Gatherings with Friends and Family: Twice a week    Attends Religious Services: More than 4 times per year    Active Member of [employer] or Organizations: Yes    Attends Engineer, Structural: More than 4 times per year    Marital Status: Never married  Intimate Partner Violence: Not At Risk (10/28/2022)   Humiliation, Afraid, Rape, and Kick questionnaire    Fear of Current or Ex-Partner: No    Emotionally Abused: No    Physically Abused: No    Sexually Abused: No    Outpatient Medications Prior to Visit  Medication Sig Dispense Refill   ibuprofen  (ADVIL ) 600 MG tablet Take 1 tablet (600 mg total) by mouth every 6 (six) hours as needed for cramping. 30 tablet 0   megestrol  (MEGACE ) 40 MG tablet 3x5d, 2x5d, then 1 daily to help control vaginal bleeding. Stop taking when bleeding stops. 45 tablet 1   acetaminophen  (TYLENOL ) 325 MG tablet Take 2 tablets (650 mg total) by mouth every 6 (six) hours as needed (for pain scale < 4). (Patient not taking: Reported on 03/29/2023) 100 tablet 0   Blood Pressure Monitor MISC For regular home bp monitoring during pregnancy (Patient not taking: Reported on 03/29/2023) 1 each 0   ferrous sulfate  325 (65 FE) MG EC tablet Take 325 mg by mouth 3 (three) times daily with meals. (Patient not taking: Reported on 12/07/2022)     prenatal vitamin w/FE, FA (PRENATAL 1 + 1) 27-1 MG TABS tablet Take 1 tablet by mouth daily at 12 noon. (Patient not taking: Reported on 12/07/2022) 30 tablet 12   senna-docusate (SENOKOT-S) 8.6-50 MG tablet Take 2 tablets by mouth 2 (two) times daily as needed for mild constipation. (Patient not taking: Reported on 12/07/2022) 60 tablet 1   Facility-Administered Medications Prior to Visit  Medication Dose Route Frequency Provider Last Rate Last Admin   acetaminophen  (TYLENOL ) tablet 650 mg  650 mg Oral Once Booker, Kimberly R, CNM       diphenhydrAMINE  (BENADRYL ) capsule 25 mg  25 mg Oral Once Booker, Kimberly R, CNM       iron  sucrose  (VENOFER ) 500 mg in sodium chloride  0.9 % 250 mL IVPB  500 mg Intravenous Once Booker, Kimberly R, CNM        Allergies  Allergen Reactions   Amoxicillin Hives   Ampicillin Hives   Penicillins Hives    Has patient had a PCN reaction causing immediate rash, facial/tongue/throat swelling, SOB or lightheadedness with hypotension: Yes Has patient had a PCN reaction causing severe rash involving mucus membranes or skin necrosis: No Has patient had a PCN reaction that required hospitalization No Has patient had a PCN  reaction occurring within the last 10 years: unknown If all of the above answers are NO, then may proceed with Cephalosporin use.     ROS Review of Systems  Constitutional:  Negative for chills and fever.  HENT:  Negative for congestion, sinus pressure, sinus pain and sore throat.   Eyes:  Negative for pain and discharge.  Respiratory:  Negative for cough and shortness of breath.   Cardiovascular:  Positive for leg swelling. Negative for chest pain and palpitations.  Gastrointestinal:  Negative for constipation, diarrhea, nausea and vomiting.       Periumbilical discomfort  Endocrine: Negative for polydipsia and polyuria.  Genitourinary:  Negative for dysuria and hematuria.  Musculoskeletal:  Negative for neck pain and neck stiffness.  Skin:  Negative for rash.  Neurological:  Negative for dizziness and weakness.  Psychiatric/Behavioral:  Positive for sleep disturbance. Negative for agitation and behavioral problems.       Objective:    Physical Exam Vitals reviewed.  Constitutional:      General: She is not in acute distress.    Appearance: She is not diaphoretic.  HENT:     Head: Normocephalic and atraumatic.     Nose: Nose normal.     Mouth/Throat:     Mouth: Mucous membranes are moist.  Eyes:     General: No scleral icterus.    Extraocular Movements: Extraocular movements intact.  Cardiovascular:     Rate and Rhythm: Normal rate and regular rhythm.      Pulses: Normal pulses.     Heart sounds: No murmur heard. Pulmonary:     Breath sounds: Normal breath sounds. No wheezing or rales.  Abdominal:     Palpations: Abdomen is soft.     Tenderness: There is no abdominal tenderness. There is no right CVA tenderness or left CVA tenderness.     Hernia: A hernia (Umbilical) is present.  Musculoskeletal:     Cervical back: Neck supple. No tenderness.     Right lower leg: Edema (Trace) present.     Left lower leg: Edema (Trace) present.  Skin:    General: Skin is warm.     Findings: No rash.  Neurological:     General: No focal deficit present.     Mental Status: She is alert and oriented to person, place, and time.     Cranial Nerves: No cranial nerve deficit.     Sensory: No sensory deficit.     Motor: No weakness.  Psychiatric:        Mood and Affect: Mood normal.        Behavior: Behavior normal.     BP 119/78   Pulse 95   Ht 5' 11 (1.803 m)   Wt 251 lb 6.4 oz (114 kg)   SpO2 96%   BMI 35.06 kg/m  Wt Readings from Last 3 Encounters:  03/29/23 251 lb 6.4 oz (114 kg)  12/07/22 226 lb (102.5 kg)  10/27/22 249 lb (112.9 kg)    Lab Results  Component Value Date   TSH 1.602 09/25/2013   Lab Results  Component Value Date   WBC 9.0 10/28/2022   HGB 10.2 (L) 10/28/2022   HCT 31.6 (L) 10/28/2022   MCV 80.0 10/28/2022   PLT 275 10/28/2022   Lab Results  Component Value Date   NA 134 (L) 09/04/2022   K 3.7 09/04/2022   CO2 20 (L) 09/04/2022   GLUCOSE 131 (H) 09/04/2022   BUN <5 (L) 09/04/2022   CREATININE 0.83  10/28/2022   BILITOT <0.1 (L) 09/04/2022   ALKPHOS 81 09/04/2022   AST 19 09/04/2022   ALT 13 09/04/2022   PROT 6.2 (L) 09/04/2022   ALBUMIN 2.4 (L) 09/04/2022   CALCIUM  7.5 (L) 09/04/2022   ANIONGAP 8 09/04/2022   EGFR 113 06/28/2022   No results found for: CHOL No results found for: HDL No results found for: LDLCALC No results found for: TRIG No results found for: CHOLHDL Lab Results   Component Value Date   HGBA1C 5.3 05/03/2022      Assessment & Plan:   Problem List Items Addressed This Visit       Other   Umbilical hernia without obstruction and without gangrene   Usually benign Advised to avoid heavy lifting, but she has 4 children and is unable to avoid straining Has seen General Surgeon - would avoid any surgery Can apply Lotrisone  for local itching      MDD (major depressive disorder)   Flowsheet Row Office Visit from 03/29/2023 in [facility]  PHQ-9 Total Score 0      Undergoing BH therapy for now No concern for SI or HI currently      Relevant Orders   CBC with Differential/Platelet   CMP14+EGFR   TSH   Encounter for general adult medical examination with abnormal findings - Primary   Physical exam as documented. Refused flu vaccine. Fasting blood tests today.      History of iron  deficiency anemia   Check CBC Advised to continue prenatal vitamins with iron       Leg swelling   Likely due to prolonged sitting or standing Advised to perform leg elevation Use compression socks as tolerated Advised to use sequential compression device/massager as tolerated Would avoid diuretic for now Advised to maintain high-protein intake      Relevant Orders   CBC with Differential/Platelet   CMP14+EGFR   Other Visit Diagnoses       Vitamin D  deficiency       Relevant Orders   Vitamin D  (25 hydroxy)       No orders of the defined types were placed in this encounter.   Follow-up: Return in about 1 year (around 03/28/2024).    Suzzane MARLA Blanch, MD

## 2023-03-29 NOTE — Telephone Encounter (Signed)
 NOTE FOR WORK SENT THROUGH MY CHART.

## 2023-03-29 NOTE — Progress Notes (Signed)
   NURSE VISIT- VAGINITIS/STD/POC  SUBJECTIVE:  Christina Conley is a 28 y.o. [phone] GYN patientfemale here for a vaginal swab for vaginitis screening.  She reports the following symptoms: abnormal bleeding: spotting every couple of weeks . Denies abnormal vaginal bleeding, significant pelvic pain, fever, or UTI symptoms.  OBJECTIVE:  There were no vitals taken for this visit.  Appears well, in no apparent distress  ASSESSMENT: Vaginal swab for vaginitis screening  PLAN: Self-collected vaginal probe for Gonorrhea, Chlamydia, Trichomonas, Bacterial Vaginosis, Yeast sent to lab Treatment: to be determined once results are received Follow-up as needed if symptoms persist/worsen, or new symptoms develop  Alexcis Bicking  03/29/2023 11:48 AM

## 2023-03-29 NOTE — Patient Instructions (Signed)
 Please continue taking Multivitamin supplement.  Please perform leg elevation and use compression socks as tolerated for leg swelling.

## 2023-03-29 NOTE — Assessment & Plan Note (Signed)
Check CBC Advised to continue prenatal vitamins with iron

## 2023-03-29 NOTE — Assessment & Plan Note (Signed)
 Physical exam as documented. Refused flu vaccine. Fasting blood tests today.

## 2023-03-29 NOTE — Assessment & Plan Note (Signed)
Usually benign Advised to avoid heavy lifting, but she has 4 children and is unable to avoid straining Has seen General Surgeon - would avoid any surgery Can apply Lotrisone for local itching

## 2023-03-29 NOTE — Assessment & Plan Note (Signed)
 Likely due to prolonged sitting or standing Advised to perform leg elevation Use compression socks as tolerated Advised to use sequential compression device/massager as tolerated Would avoid diuretic for now Advised to maintain high-protein intake

## 2023-03-29 NOTE — Assessment & Plan Note (Signed)
 Flowsheet Row Office Visit from 03/29/2023 in [facility]  PHQ-9 Total Score 0      Undergoing BH therapy for now No concern for SI or HI currently

## 2023-03-30 ENCOUNTER — Encounter: Payer: Self-pay | Admitting: Internal Medicine

## 2023-03-30 DIAGNOSIS — F331 Major depressive disorder, recurrent, moderate: Secondary | ICD-10-CM | POA: Diagnosis not present

## 2023-03-30 DIAGNOSIS — F411 Generalized anxiety disorder: Secondary | ICD-10-CM | POA: Diagnosis not present

## 2023-03-30 LAB — CMP14+EGFR
ALT: 17 [IU]/L (ref 0–32)
AST: 19 [IU]/L (ref 0–40)
Albumin: 4.3 g/dL (ref 4.0–5.0)
Alkaline Phosphatase: 81 [IU]/L (ref 44–121)
BUN/Creatinine Ratio: 13 (ref 9–23)
BUN: 12 mg/dL (ref 6–20)
Bilirubin Total: 0.3 mg/dL (ref 0.0–1.2)
CO2: 23 mmol/L (ref 20–29)
Calcium: 9.6 mg/dL (ref 8.7–10.2)
Chloride: 104 mmol/L (ref 96–106)
Creatinine, Ser: 0.92 mg/dL (ref 0.57–1.00)
Globulin, Total: 2.7 g/dL (ref 1.5–4.5)
Glucose: 77 mg/dL (ref 70–99)
Potassium: 4.5 mmol/L (ref 3.5–5.2)
Sodium: 140 mmol/L (ref 134–144)
Total Protein: 7 g/dL (ref 6.0–8.5)
eGFR: 88 mL/min/{1.73_m2} (ref >59)

## 2023-03-30 LAB — CERVICOVAGINAL ANCILLARY ONLY
Bacterial Vaginitis (gardnerella): NEGATIVE
Candida Glabrata: NEGATIVE
Candida Vaginitis: NEGATIVE
Chlamydia: NEGATIVE
Comment: NEGATIVE
Comment: NEGATIVE
Comment: NEGATIVE
Comment: NEGATIVE
Comment: NEGATIVE
Comment: NORMAL
Neisseria Gonorrhea: NEGATIVE
Trichomonas: NEGATIVE

## 2023-03-30 LAB — CBC WITH DIFFERENTIAL/PLATELET
Basophils Absolute: 0 10*3/uL (ref 0.0–0.2)
Basos: 1 %
EOS (ABSOLUTE): 0.1 10*3/uL (ref 0.0–0.4)
Eos: 1 %
Hematocrit: 38.5 % (ref 34.0–46.6)
Hemoglobin: 13.3 g/dL (ref 11.1–15.9)
Immature Grans (Abs): 0 10*3/uL (ref 0.0–0.1)
Immature Granulocytes: 0 %
Lymphocytes Absolute: 2.2 10*3/uL (ref 0.7–3.1)
Lymphs: 31 %
MCH: 30.9 pg (ref 26.6–33.0)
MCHC: 34.5 g/dL (ref 31.5–35.7)
MCV: 90 fL (ref 79–97)
Monocytes Absolute: 0.6 10*3/uL (ref 0.1–0.9)
Monocytes: 8 %
Neutrophils Absolute: 4.3 10*3/uL (ref 1.4–7.0)
Neutrophils: 59 %
Platelets: 373 10*3/uL (ref 150–450)
RBC: 4.3 x10E6/uL (ref 3.77–5.28)
RDW: 12.3 % (ref 11.7–15.4)
WBC: 7.2 10*3/uL (ref 3.4–10.8)

## 2023-03-30 LAB — TSH: TSH: 1.79 u[IU]/mL (ref 0.450–4.500)

## 2023-03-30 LAB — VITAMIN D 25 HYDROXY (VIT D DEFICIENCY, FRACTURES): Vit D, 25-Hydroxy: 20.4 ng/mL — ABNORMAL LOW (ref 30.0–100.0)

## 2023-04-07 DIAGNOSIS — F411 Generalized anxiety disorder: Secondary | ICD-10-CM | POA: Diagnosis not present

## 2023-04-07 DIAGNOSIS — F331 Major depressive disorder, recurrent, moderate: Secondary | ICD-10-CM | POA: Diagnosis not present

## 2023-04-27 DIAGNOSIS — F331 Major depressive disorder, recurrent, moderate: Secondary | ICD-10-CM | POA: Diagnosis not present

## 2023-04-27 DIAGNOSIS — F411 Generalized anxiety disorder: Secondary | ICD-10-CM | POA: Diagnosis not present

## 2023-05-25 DIAGNOSIS — F331 Major depressive disorder, recurrent, moderate: Secondary | ICD-10-CM | POA: Diagnosis not present

## 2023-05-25 DIAGNOSIS — F411 Generalized anxiety disorder: Secondary | ICD-10-CM | POA: Diagnosis not present

## 2023-05-30 ENCOUNTER — Other Ambulatory Visit (HOSPITAL_COMMUNITY)
Admission: RE | Admit: 2023-05-30 | Discharge: 2023-05-30 | Disposition: A | Discharge status: Home / Self Care | Source: Ambulatory Visit | Attending: Women's Health | Admitting: Women's Health

## 2023-05-30 ENCOUNTER — Ambulatory Visit: Admitting: Women's Health

## 2023-05-30 ENCOUNTER — Encounter: Payer: Self-pay | Admitting: Women's Health

## 2023-05-30 VITALS — BP 103/74 | HR 89 | Wt 250.0 lb

## 2023-05-30 DIAGNOSIS — N921 Excessive and frequent menstruation with irregular cycle: Secondary | ICD-10-CM | POA: Insufficient documentation

## 2023-05-30 DIAGNOSIS — N946 Dysmenorrhea, unspecified: Secondary | ICD-10-CM

## 2023-05-30 DIAGNOSIS — Z975 Presence of (intrauterine) contraceptive device: Secondary | ICD-10-CM | POA: Diagnosis not present

## 2023-05-30 LAB — POCT HEMOGLOBIN: Hemoglobin: 12.3 g/dL (ref 11–14.6)

## 2023-05-30 MED ORDER — MEGESTROL ACETATE 40 MG PO TABS: ORAL_TABLET | ORAL | 1 refills | Status: DC

## 2023-05-30 NOTE — Progress Notes (Addendum)
 GYN VISIT Patient name: Christina Conley MRN [id number]  Date of birth: [birth year] Chief Complaint:   Dysmenorrhea (Passing blood clots)  History of Present Illness:   Christina Conley is a 28 y.o. [phone] African-American female being seen today for heavy bleeding w/ Nexplanon. Took megace +ibuprofen in past and it stopped. Just started back on 4/4, changing pad q couple of hours, golf-ball sized clots, +cramping. Denies abnormal discharge, itching/odor/irritation. Also weight gain, was 226lb at pp visit 12/07/22. Does not want to switch birth control.      Patient's last menstrual period was 05/26/2023. The current method of family planning is Nexplanon inserted 10/30/22 after birth of twins Last pap 09/17/20. Results were: NILM w/ HRHPV not done     03/29/2023    1:01 PM 05/03/2022   10:21 AM 03/23/2022    1:07 PM 06/23/2021    3:37 PM 03/03/2021    2:39 PM  Depression screen PHQ 2/9  Decreased Interest 0 1 0 0 0  Down, Depressed, Hopeless 0 1 0 0 1  PHQ - 2 Score 0 2 0 0 1  Altered sleeping 0 0   3  Tired, decreased energy 0 2   3  Change in appetite 0 0   3  Feeling bad or failure about yourself  0 0   0  Trouble concentrating 0 0   0  Moving slowly or fidgety/restless 0 0   0  Suicidal thoughts 0 0   0  PHQ-9 Score 0 4   10  Difficult doing work/chores     Somewhat difficult        03/29/2023    1:01 PM 05/03/2022   10:21 AM 02/03/2021   10:19 AM 02/26/2020    2:56 PM  GAD 7 : Generalized Anxiety Score  Nervous, Anxious, on Edge 0 2 1 1   Control/stop worrying 0 0 1 2  Worry too much - different things 0 0 1 2  Trouble relaxing 0 0 0 3  Restless 0 0 0 0  Easily annoyed or irritable 0 3 1 2   Afraid - awful might happen 0 0 0 0  Total GAD 7 Score 0 5 4 10   Anxiety Difficulty Not difficult at all  Somewhat difficult      Review of Systems:   Pertinent items are noted in HPI Denies fever/chills, dizziness, headaches, visual disturbances, fatigue, shortness of breath, chest pain,  abdominal pain, vomiting, abnormal vaginal discharge/itching/odor/irritation, problems with periods, bowel movements, urination, or intercourse unless otherwise stated above.  Pertinent History Reviewed:  Reviewed past medical,surgical, social, obstetrical and family history.  Reviewed problem list, medications and allergies. Physical Assessment:   Vitals:   05/30/23 1600  BP: 103/74  Pulse: 89  Weight: 250 lb (113.4 kg)  Body mass index is 34.87 kg/m.       Physical Examination:   General appearance: alert, well appearing, and in no distress  Mental status: alert, oriented to person, place, and time  Skin: warm & dry   Cardiovascular: normal heart rate noted  Respiratory: normal respiratory effort, no distress  Abdomen: soft, non-tender   Pelvic: VULVA: normal appearing vulva with no masses, tenderness or lesions, VAGINA: normal appearing vagina with normal color and discharge, no lesions, mod amt menstrual blood CERVIX: normal appearing cervix without discharge or lesions  Extremities: no edema   Chaperone: Jobe Marker  Results for orders placed or performed in visit on 05/30/23 (from the past 24 hours)  POCT hemoglobin  Collection Time: 05/30/23  4:32 PM  Result Value Ref Range   Hemoglobin 12.3 11 - 14.6 g/dL    Assessment & Plan:  1) Menorrhagia/dysmenorrhea on Nexplanon> rx megace, can add ibuprofen. CV swab sent  Meds:  Meds ordered this encounter  Medications   megestrol (MEGACE) 40 MG tablet    Sig: 3x5d, 2x5d, then 1 daily to help control vaginal bleeding. Stop taking when bleeding stops.    Dispense:  45 tablet    Refill:  1    Orders Placed This Encounter  Procedures   POCT hemoglobin    Return for July for , Pap & physical.  Cheral Marker CNM, [facility] 05/30/2023 4:44 PM

## 2023-05-30 NOTE — Addendum Note (Signed)
 Addended by: Federico Flake A on: 05/30/2023 04:32 PM   Modules accepted: Orders

## 2023-05-31 LAB — CERVICOVAGINAL ANCILLARY ONLY
Bacterial Vaginitis (gardnerella): POSITIVE — AB
Candida Glabrata: NEGATIVE
Candida Vaginitis: POSITIVE — AB
Chlamydia: NEGATIVE
Comment: NEGATIVE
Comment: NEGATIVE
Comment: NEGATIVE
Comment: NEGATIVE
Comment: NEGATIVE
Comment: NORMAL
Neisseria Gonorrhea: NEGATIVE
Trichomonas: NEGATIVE

## 2023-06-01 ENCOUNTER — Encounter: Payer: Self-pay | Admitting: Women's Health

## 2023-06-01 MED ORDER — METRONIDAZOLE 500 MG PO TABS: 500.0000 mg | ORAL_TABLET | Freq: Two times a day (BID) | ORAL | 0 refills | Status: DC

## 2023-06-01 MED ORDER — FLUCONAZOLE 150 MG PO TABS: 150.0000 mg | ORAL_TABLET | Freq: Once | ORAL | 0 refills | Status: AC

## 2023-06-01 NOTE — Addendum Note (Signed)
 Addended by: Cheral Marker on: 06/01/2023 08:29 AM   Modules accepted: Orders

## 2023-06-22 DIAGNOSIS — F411 Generalized anxiety disorder: Secondary | ICD-10-CM | POA: Diagnosis not present

## 2023-06-22 DIAGNOSIS — F331 Major depressive disorder, recurrent, moderate: Secondary | ICD-10-CM | POA: Diagnosis not present

## 2023-06-29 ENCOUNTER — Encounter: Payer: Self-pay | Admitting: Women's Health

## 2023-06-29 ENCOUNTER — Other Ambulatory Visit: Payer: Self-pay | Admitting: Women's Health

## 2023-06-29 MED ORDER — IBUPROFEN 600 MG PO TABS: 600.0000 mg | ORAL_TABLET | Freq: Four times a day (QID) | ORAL | 2 refills | Status: DC | PRN

## 2023-07-13 DIAGNOSIS — F411 Generalized anxiety disorder: Secondary | ICD-10-CM | POA: Diagnosis not present

## 2023-07-13 DIAGNOSIS — F331 Major depressive disorder, recurrent, moderate: Secondary | ICD-10-CM | POA: Diagnosis not present

## 2023-07-14 ENCOUNTER — Encounter: Payer: Self-pay | Admitting: Women's Health

## 2023-07-17 ENCOUNTER — Other Ambulatory Visit: Payer: Self-pay

## 2023-07-17 ENCOUNTER — Emergency Department (HOSPITAL_COMMUNITY)
Admission: EM | Admit: 2023-07-17 | Discharge: 2023-07-17 | Disposition: A | Discharge status: Home / Self Care | Attending: Emergency Medicine | Admitting: Emergency Medicine

## 2023-07-17 ENCOUNTER — Emergency Department (HOSPITAL_COMMUNITY)

## 2023-07-17 ENCOUNTER — Encounter (HOSPITAL_COMMUNITY): Payer: Self-pay | Admitting: Emergency Medicine

## 2023-07-17 DIAGNOSIS — W010XXA Fall on same level from slipping, tripping and stumbling without subsequent striking against object, initial encounter: Secondary | ICD-10-CM | POA: Insufficient documentation

## 2023-07-17 DIAGNOSIS — Y92007 Garden or yard of unspecified non-institutional (private) residence as the place of occurrence of the external cause: Secondary | ICD-10-CM | POA: Diagnosis not present

## 2023-07-17 DIAGNOSIS — J45909 Unspecified asthma, uncomplicated: Secondary | ICD-10-CM | POA: Diagnosis not present

## 2023-07-17 DIAGNOSIS — S92321A Displaced fracture of second metatarsal bone, right foot, initial encounter for closed fracture: Secondary | ICD-10-CM | POA: Diagnosis not present

## 2023-07-17 DIAGNOSIS — S92324A Nondisplaced fracture of second metatarsal bone, right foot, initial encounter for closed fracture: Secondary | ICD-10-CM | POA: Diagnosis not present

## 2023-07-17 DIAGNOSIS — S92334A Nondisplaced fracture of third metatarsal bone, right foot, initial encounter for closed fracture: Secondary | ICD-10-CM | POA: Insufficient documentation

## 2023-07-17 DIAGNOSIS — S99921A Unspecified injury of right foot, initial encounter: Secondary | ICD-10-CM | POA: Diagnosis present

## 2023-07-17 DIAGNOSIS — S92344A Nondisplaced fracture of fourth metatarsal bone, right foot, initial encounter for closed fracture: Secondary | ICD-10-CM | POA: Insufficient documentation

## 2023-07-17 DIAGNOSIS — W19XXXA Unspecified fall, initial encounter: Secondary | ICD-10-CM | POA: Diagnosis not present

## 2023-07-17 DIAGNOSIS — Y9302 Activity, running: Secondary | ICD-10-CM | POA: Insufficient documentation

## 2023-07-17 DIAGNOSIS — R609 Edema, unspecified: Secondary | ICD-10-CM | POA: Diagnosis not present

## 2023-07-17 DIAGNOSIS — R600 Localized edema: Secondary | ICD-10-CM | POA: Diagnosis not present

## 2023-07-17 DIAGNOSIS — S92341A Displaced fracture of fourth metatarsal bone, right foot, initial encounter for closed fracture: Secondary | ICD-10-CM | POA: Diagnosis not present

## 2023-07-17 DIAGNOSIS — S92301A Fracture of unspecified metatarsal bone(s), right foot, initial encounter for closed fracture: Secondary | ICD-10-CM

## 2023-07-17 MED ORDER — ACETAMINOPHEN 325 MG PO TABS
650.0000 mg | ORAL_TABLET | Freq: Once | ORAL | Status: AC
  Administered 2023-07-17: 650 mg via ORAL
  Filled 2023-07-17: qty 2

## 2023-07-17 MED ORDER — OXYCODONE HCL 5 MG PO TABS
5.0000 mg | ORAL_TABLET | ORAL | 0 refills | Status: DC | PRN
Start: 2023-07-17 — End: 2024-01-10

## 2023-07-17 MED ORDER — IBUPROFEN 400 MG PO TABS
400.0000 mg | ORAL_TABLET | Freq: Once | ORAL | Status: AC
  Administered 2023-07-17: 400 mg via ORAL
  Filled 2023-07-17: qty 1

## 2023-07-17 NOTE — Discharge Instructions (Addendum)
 Apply ice for 30 minutes at a time, 4 times a day.  Keep your foot elevated is much as possible.  No weightbearing until the orthopedic doctor tells you that it is acceptable to bear weight.  Take acetaminophen  and/or ibuprofen  as needed for pain.  To get additional pain relief, you combine ibuprofen  and acetaminophen .  Take oxycodone  for pain that is not relieved by the combination of ibuprofen  and acetaminophen .

## 2023-07-17 NOTE — ED Provider Notes (Signed)
 [place] EMERGENCY DEPARTMENT AT [facility] Provider Note   CSN: [id number] Arrival date & time: 07/17/23  4098     History  Chief Complaint  Patient presents with   Christina Conley is a 28 y.o. female.  The history is provided by the patient.  Fall  She has history of asthma and comes in following a fall.  She was running and tripped and injured her right foot.  She denies other injury.   Home Medications Prior to Admission medications   Medication Sig Start Date End Date Taking? Authorizing Provider  oxyCODONE  (ROXICODONE ) 5 MG immediate release tablet Take 1 tablet (5 mg total) by mouth every 4 (four) hours as needed for severe pain (pain score 7-10). 07/17/23  Yes Alissa April, MD  ibuprofen  (ADVIL ) 600 MG tablet Take 1 tablet (600 mg total) by mouth every 6 (six) hours as needed for cramping. 06/29/23   Ferd Householder, CNM  megestrol  (MEGACE ) 40 MG tablet 3x5d, 2x5d, then 1 daily to help control vaginal bleeding. Stop taking when bleeding stops. 05/30/23   Ferd Householder, CNM      Allergies    Amoxicillin, Ampicillin, and Penicillins    Review of Systems   Review of Systems  All other systems reviewed and are negative.   Physical Exam Updated Vital Signs BP (!) 109/90   Pulse 98   Temp 98.1 F (36.7 C) (Oral)   Resp 16   Ht 5\' 11"  (1.803 m)   Wt 111.1 kg   LMP 06/26/2023   SpO2 97%   BMI 34.17 kg/m  Physical Exam Vitals and nursing note reviewed.   28 year old female, resting comfortably and in no acute distress. Vital signs are normal. Oxygen saturation is 1000%, which is normal. Head is normocephalic and atraumatic. PERRLA, EOMI. Lungs are clear without rales, wheezes, or rhonchi. Heart has regular rate and rhythm without murmur. Extremities: There is soft tissue swelling across the right midfoot with tenderness to palpation over the proximal metatarsals.  Distal neurovascular exam is intact with prompt capillary refill and  normal sensation. Skin is warm and dry without rash. Neurologic: Mental status is normal, moves all extremities equally.    ED Results / Procedures / Treatments   Labs (all labs ordered are listed, but only abnormal results are displayed) Labs Reviewed - No data to display  Radiology DG Foot Complete Right Result Date: 07/17/2023 CLINICAL DATA:  Status post fall with subsequent right ankle pain. EXAM: RIGHT FOOT COMPLETE - 3+ VIEW COMPARISON:  None Available. FINDINGS: Acute fracture deformities are seen involving the bases of the second, third and fourth right metatarsals. There is no evidence of dislocation. There is no evidence of arthropathy or other focal bone abnormality. Dorsal soft tissue swelling is seen along the mid to distal right foot. IMPRESSION: Acute fractures of the second, third and fourth right metatarsals. Electronically Signed   By: Virgle Grime M.D.   On: 07/17/2023 04:35    Procedures .Ortho Injury Treatment  Date/Time: 07/17/2023 7:50 AM  Performed by: Alissa April, MD Authorized by: Alissa April, MD   Consent:    Consent obtained:  Verbal   Consent given by:  Patient   Risks discussed:  Fracture   Alternatives discussed:  Alternative treatmentInjury location: foot Location details: right foot Injury type: fracture Pre-procedure neurovascular assessment: neurovascularly intact Pre-procedure distal perfusion: normal Pre-procedure neurological function: normal Pre-procedure range of motion: normal  Anesthesia: Local anesthesia used: no  Patient sedated: NoManipulation performed: no Immobilization: CAM Boot. Splint Applied by: ED Nurse Supplies used: CAM Boot. Post-procedure distal perfusion: normal Post-procedure neurological function: normal Post-procedure range of motion: unchanged       Medications Ordered in ED Medications  ibuprofen  (ADVIL ) tablet 400 mg (400 mg Oral Given 07/17/23 0709)  acetaminophen  (TYLENOL ) tablet 650 mg (650 mg  Oral Given 07/17/23 0709)    ED Course/ Medical Decision Making/ A&P                                 Medical Decision Making Amount and/or Complexity of Data Reviewed Radiology: ordered.  Risk OTC drugs. Prescription drug management.   Fall with injury to right foot.  I have ordered x-rays.  X-rays show fracture of the base of the second/3rd/4th metatarsals.  I have ordered a CT scan to further clarify the fracture.  I have viewed the images and it appears that fractures are nondisplaced without any Lisfranc injury, official reading by radiologist is pending.  I have ordered a cam boot be placed and patient be given crutches.  She is instructed on no weightbearing until cleared by orthopedics.  I have advised her on ice and elevation and told to use over-the-counter NSAIDs and acetaminophen  as needed for pain.  Also given prescription for oxycodone  for breakthrough pain.  Final Clinical Impression(s) / ED Diagnoses Final diagnoses:  Fall from slip, trip, or stumble, initial encounter  Closed nondisplaced fracture of metatarsal bone of right foot, unspecified metatarsal, initial encounter    Rx / DC Orders ED Discharge Orders          Ordered    oxyCODONE  (ROXICODONE ) 5 MG immediate release tablet  Every 4 hours PRN        07/17/23 0748              Alissa April, MD 07/17/23 [phone]

## 2023-07-17 NOTE — ED Triage Notes (Signed)
 Pt reports falling while playing in the yard and fell, c/o right ankle pain

## 2023-07-21 DIAGNOSIS — S93324A Dislocation of tarsometatarsal joint of right foot, initial encounter: Secondary | ICD-10-CM | POA: Diagnosis not present

## 2023-07-24 HISTORY — PX: FOOT SURGERY: SHX648

## 2023-07-25 ENCOUNTER — Encounter: Admitting: Advanced Practice Midwife

## 2023-07-25 DIAGNOSIS — G8918 Other acute postprocedural pain: Secondary | ICD-10-CM | POA: Diagnosis not present

## 2023-07-25 DIAGNOSIS — S92341A Displaced fracture of fourth metatarsal bone, right foot, initial encounter for closed fracture: Secondary | ICD-10-CM | POA: Diagnosis not present

## 2023-07-25 DIAGNOSIS — S92321A Displaced fracture of second metatarsal bone, right foot, initial encounter for closed fracture: Secondary | ICD-10-CM | POA: Diagnosis not present

## 2023-07-25 DIAGNOSIS — S93324A Dislocation of tarsometatarsal joint of right foot, initial encounter: Secondary | ICD-10-CM | POA: Diagnosis not present

## 2023-07-25 DIAGNOSIS — S92311A Displaced fracture of first metatarsal bone, right foot, initial encounter for closed fracture: Secondary | ICD-10-CM | POA: Diagnosis not present

## 2023-07-25 DIAGNOSIS — S92331A Displaced fracture of third metatarsal bone, right foot, initial encounter for closed fracture: Secondary | ICD-10-CM | POA: Diagnosis not present

## 2023-07-31 ENCOUNTER — Encounter: Payer: Self-pay | Admitting: Women's Health

## 2023-08-08 DIAGNOSIS — S92321A Displaced fracture of second metatarsal bone, right foot, initial encounter for closed fracture: Secondary | ICD-10-CM | POA: Diagnosis not present

## 2023-08-14 ENCOUNTER — Encounter: Admitting: Women's Health

## 2023-08-24 DIAGNOSIS — F411 Generalized anxiety disorder: Secondary | ICD-10-CM | POA: Diagnosis not present

## 2023-08-24 DIAGNOSIS — F331 Major depressive disorder, recurrent, moderate: Secondary | ICD-10-CM | POA: Diagnosis not present

## 2023-08-30 ENCOUNTER — Ambulatory Visit: Admitting: Adult Health

## 2023-09-01 ENCOUNTER — Ambulatory Visit: Admitting: Obstetrics & Gynecology

## 2023-09-06 DIAGNOSIS — S92321A Displaced fracture of second metatarsal bone, right foot, initial encounter for closed fracture: Secondary | ICD-10-CM | POA: Diagnosis not present

## 2023-09-20 ENCOUNTER — Other Ambulatory Visit: Payer: Self-pay | Admitting: Women's Health

## 2023-09-20 ENCOUNTER — Encounter: Payer: Self-pay | Admitting: Women's Health

## 2023-09-20 MED ORDER — MEGESTROL ACETATE 40 MG PO TABS: ORAL_TABLET | ORAL | 1 refills | Status: DC

## 2023-09-20 MED ORDER — IBUPROFEN 600 MG PO TABS: 600.0000 mg | ORAL_TABLET | Freq: Four times a day (QID) | ORAL | 2 refills | Status: DC | PRN

## 2023-09-21 ENCOUNTER — Telehealth: Payer: Self-pay | Admitting: *Deleted

## 2023-09-21 NOTE — Telephone Encounter (Signed)
 Megace  tablets currently unavailable. Liquid and 20mg  tablets are available.

## 2023-09-22 MED ORDER — MEGESTROL ACETATE 20 MG PO TABS
ORAL_TABLET | ORAL | 0 refills | Status: DC
Start: 2023-09-22 — End: 2024-01-10

## 2023-09-22 NOTE — Telephone Encounter (Signed)
 New rx sent for megace  using 20 mg tabs

## 2023-09-22 NOTE — Addendum Note (Signed)
 Addended by: Otilia Kareem A on: 09/22/2023 12:33 PM   Modules accepted: Orders

## 2023-10-06 DIAGNOSIS — S92321A Displaced fracture of second metatarsal bone, right foot, initial encounter for closed fracture: Secondary | ICD-10-CM | POA: Diagnosis not present

## 2023-11-10 DIAGNOSIS — M79671 Pain in right foot: Secondary | ICD-10-CM | POA: Diagnosis not present

## 2023-12-01 ENCOUNTER — Telehealth: Payer: Self-pay

## 2023-12-01 DIAGNOSIS — Z7189 Other specified counseling: Secondary | ICD-10-CM

## 2023-12-01 NOTE — Progress Notes (Signed)
 Complex Care Management Note Care Guide Note  12/01/2023 Name: Christina Conley MRN: [id number] DOB: [birth year]   Complex Care Management Outreach Attempts: An unsuccessful telephone outreach was attempted today to offer the patient information about available complex care management services.  Follow Up Plan:  Additional outreach attempts will be made to offer the patient complex care management information and services.   Encounter Outcome:  No Answer  Jeoffrey Buffalo , RMA     [place]  [facility], [facility] Guide  Direct Dial : [phone]  Website: [email or web address]

## 2023-12-06 NOTE — Progress Notes (Signed)
 Complex Care Management Note Care Guide Note  12/06/2023 Name: Christina Conley MRN: [id number] DOB: 01-06-1996   Complex Care Management Outreach Attempts: A second unsuccessful outreach was attempted today to offer the patient with information about available complex care management services.  Follow Up Plan:  Additional outreach attempts will be made to offer the patient complex care management information and services.   Encounter Outcome:  No Answer  Jeoffrey Buffalo , RMA     [place]  [facility], [facility] Guide  Direct Dial : [phone]  Website: [email or web address]

## 2023-12-11 NOTE — Progress Notes (Signed)
 Complex Care Management Note  Care Guide Note 12/11/2023 Name: Christina Conley MRN: [id number] DOB: 09-08-1995  Christina Conley is a 28 y.o. year old female who sees Tobie Suzzane POUR, MD for primary care. I reached out to Christina Conley by phone today to offer complex care management services.  Ms. Doke was given information about Complex Care Management services today including:   The Complex Care Management services include support from the care team which includes your Nurse Care Manager, Clinical Social Worker, or Pharmacist.  The Complex Care Management team is here to help remove barriers to the health concerns and goals most important to you. Complex Care Management services are voluntary, and the patient may decline or stop services at any time by request to their care team member.   Complex Care Management Consent Status: Patient did not agree to participate in complex care management services at this time.  Follow up plan:    Encounter Outcome:  Patient Refused  Jeoffrey Buffalo , RMA     [place]  [facility], [facility] Guide  Direct Dial : [phone]  Website: [email or web address]

## 2024-01-10 ENCOUNTER — Ambulatory Visit: Payer: Self-pay | Admitting: Family Medicine

## 2024-01-10 VITALS — BP 106/70 | HR 94 | Temp 98.1°F | Ht 71.0 in | Wt 247.0 lb

## 2024-01-10 DIAGNOSIS — J988 Other specified respiratory disorders: Secondary | ICD-10-CM | POA: Diagnosis not present

## 2024-01-10 DIAGNOSIS — J029 Acute pharyngitis, unspecified: Secondary | ICD-10-CM

## 2024-01-10 MED ORDER — DOXYCYCLINE HYCLATE 100 MG PO CAPS: 100.0000 mg | ORAL_CAPSULE | Freq: Two times a day (BID) | ORAL | 0 refills | Status: DC

## 2024-01-10 NOTE — Patient Instructions (Addendum)
 Take with food.  Lots of fluids and rest (as much as you can).  Take care  Dr. Bluford

## 2024-01-10 NOTE — Progress Notes (Signed)
 Subjective:  Patient ID: Christina Conley, female    DOB: [birth year]  Age: 28 y.o. MRN: [id number]  CC:   Chief Complaint  Patient presents with   Cough    Productive x 2 weeks    right ear pain     And right throat pain     HPI:  28 year old female presents with respiratory symptoms.  Patient reports that she has had respiratory symptoms over the past 2 weeks.  She has had a productive cough which seems to be improved but is still persistent.  She also reports some rhinorrhea.  Now having right ear pain and pressure.  She states that she is having a sore throat as well particular on the right side.  No fever.  No relieving factors.  She has had other sick contacts in her household with similar symptoms.  She is concerned that she has an infection.  Social Hx   Social History   Socioeconomic History   Marital status: Single    Spouse name: Not on file   Number of children: Not on file   Years of education: Not on file   Highest education level: Some college, no degree  Occupational History   Not on file  Tobacco Use   Smoking status: Former    Current packs/day: 0.25    Average packs/day: 0.3 packs/day for 0.5 years (0.1 ttl pk-yrs)    Types: Cigarettes   Smokeless tobacco: Never  Vaping Use   Vaping status: Never Used  Substance and Sexual Activity   Alcohol use: Yes    Comment: occ   Drug use: Not Currently    Types: Marijuana   Sexual activity: Yes    Birth control/protection: Implant  Other Topics Concern   Not on file  Social History Narrative   Not on file   Social Drivers of Health   Financial Resource Strain: Low Risk  (01/10/2024)   Overall Financial Resource Strain (CARDIA)    Difficulty of Paying Living Expenses: Not hard at all  Food Insecurity: No Food Insecurity (01/10/2024)   Hunger Vital Sign    Worried About Running Out of Food in the Last Year: Never true    Ran Out of Food in the Last Year: Never true  Transportation Needs: No  Transportation Needs (01/10/2024)   PRAPARE - Administrator, Civil Service (Medical): No    Lack of Transportation (Non-Medical): No  Physical Activity: Inactive (01/10/2024)   Exercise Vital Sign    Days of Exercise per Week: 2 days    Minutes of Exercise per Session: 0 min  Stress: Stress Concern Present (01/10/2024)   [employer] of Occupational Health - Occupational Stress Questionnaire    Feeling of Stress: Rather much  Social Connections: Moderately Isolated (01/10/2024)   Social Connection and Isolation Panel    Frequency of Communication with Friends and Family: More than three times a week    Frequency of Social Gatherings with Friends and Family: Once a week    Attends Religious Services: More than 4 times per year    Active Member of [employer] or Organizations: No    Attends Engineer, Structural: Not on file    Marital Status: Never married    Review of Systems Per HPI  Objective:  BP 106/70   Pulse 94   Temp 98.1 F (36.7 C)   Ht 5' 11 (1.803 m)   Wt 247 lb (112 kg)   SpO2 98%   BMI  34.45 kg/m      01/10/2024    8:22 AM 07/17/2023    3:45 AM 07/17/2023    3:30 AM  BP/Weight  Systolic BP 106 109 90  Diastolic BP 70 90 67  Wt. (Lbs) 247    BMI 34.45 kg/m2      Physical Exam Vitals and nursing note reviewed.  Constitutional:      General: She is not in acute distress. HENT:     Head: Normocephalic and atraumatic.     Right Ear: Tympanic membrane normal.     Left Ear: Tympanic membrane normal.     Mouth/Throat:     Pharynx: Posterior oropharyngeal erythema present.  Cardiovascular:     Rate and Rhythm: Normal rate and regular rhythm.  Pulmonary:     Effort: Pulmonary effort is normal.     Breath sounds: Normal breath sounds. No wheezing, rhonchi or rales.  Neurological:     Mental Status: She is alert.     Lab Results  Component Value Date   WBC 7.2 03/29/2023   HGB 12.3 05/30/2023   HCT 38.5 03/29/2023   PLT 373  03/29/2023   GLUCOSE 77 03/29/2023   ALT 17 03/29/2023   AST 19 03/29/2023   NA 140 03/29/2023   K 4.5 03/29/2023   CL 104 03/29/2023   CREATININE 0.92 03/29/2023   BUN 12 03/29/2023   CO2 23 03/29/2023   TSH 1.790 03/29/2023   HGBA1C 5.3 05/03/2022     Assessment & Plan:  Respiratory infection Assessment & Plan: Persistent respiratory symptoms which have not improved.  Rapid strep negative.  No evidence of otitis media.  Upper and lower tract symptoms.  Placing on doxycycline.  Orders: -     Doxycycline Hyclate; Take 1 capsule (100 mg total) by mouth 2 (two) times daily.  Dispense: 14 capsule; Refill: 0  Sore throat -     Rapid Strep Screen ([facility] ONLY)    Follow-up:  Return if symptoms worsen or fail to improve.  Jacqulyn Ahle DO [facility]

## 2024-01-10 NOTE — Assessment & Plan Note (Signed)
 Persistent respiratory symptoms which have not improved.  Rapid strep negative.  No evidence of otitis media.  Upper and lower tract symptoms.  Placing on doxycycline.

## 2024-01-12 ENCOUNTER — Encounter: Payer: Self-pay | Admitting: Women's Health

## 2024-01-12 ENCOUNTER — Other Ambulatory Visit: Payer: Self-pay | Admitting: Adult Health

## 2024-01-12 MED ORDER — IBUPROFEN 800 MG PO TABS: 800.0000 mg | ORAL_TABLET | Freq: Three times a day (TID) | ORAL | 1 refills | Status: AC | PRN

## 2024-01-12 NOTE — Progress Notes (Signed)
 Refilled ibuprofen 800 mg

## 2024-01-13 LAB — CULTURE, GROUP A STREP

## 2024-02-21 ENCOUNTER — Ambulatory Visit
Admission: EM | Admit: 2024-02-21 | Discharge: 2024-02-21 | Disposition: A | Discharge status: Home / Self Care | Attending: Family Medicine | Admitting: Family Medicine

## 2024-02-21 DIAGNOSIS — R509 Fever, unspecified: Secondary | ICD-10-CM | POA: Diagnosis not present

## 2024-02-21 DIAGNOSIS — J101 Influenza due to other identified influenza virus with other respiratory manifestations: Secondary | ICD-10-CM | POA: Diagnosis not present

## 2024-02-21 LAB — POCT INFLUENZA A/B
Influenza A, POC: NEGATIVE
Influenza B, POC: POSITIVE — AB

## 2024-02-21 MED ORDER — OSELTAMIVIR PHOSPHATE 75 MG PO CAPS: 75.0000 mg | ORAL_CAPSULE | Freq: Two times a day (BID) | ORAL | 0 refills | Status: DC

## 2024-02-21 NOTE — ED Provider Notes (Signed)
 " [facility]    CSN: [id number] Arrival date & time: 02/21/24  1818      History   Chief Complaint Chief Complaint  Patient presents with   Cough    HPI Christina Conley is a 28 y.o. female.   Patient presenting today with 1 day history of fever, chills, body aches, cough, congestion.  Denies chest pain, shortness of breath, abdominal pain, vomiting, diarrhea.  So far not trying anything over-the-counter for symptoms.  Children all sick with similar symptoms.    Past Medical History:  Diagnosis Date   Acute blood loss anemia 08/12/2020   EBL with delivery 607mL PO iron , IV venofer  administered postpartum 1 U pRBCs Patient asymptomatic   Asthma    child.  No meds > 2 years   Environmental allergies    Gonorrhea 08/2015   Irregular intermenstrual bleeding 05/27/2015   Marginal insertion of umbilical cord affecting management of mother    Baby B    Patient Active Problem List   Diagnosis Date Noted   Respiratory infection 01/10/2024   Nexplanon  insertion 12/07/2022   History of iron  deficiency anemia 03/23/2022   MDD (major depressive disorder) 02/03/2021   Diastasis recti 11/26/2020   Umbilical hernia without obstruction and without gangrene 10/28/2020    Past Surgical History:  Procedure Laterality Date   NO PAST SURGERIES      OB History     Gravida  4   Para  3   Term  2   Preterm  1   AB  1   Living  5      SAB  1   IAB      Ectopic      Multiple  2   Live Births  5            Home Medications    Prior to Admission medications  Medication Sig Start Date End Date Taking? Authorizing Provider  ibuprofen  (ADVIL ) 800 MG tablet Take 1 tablet (800 mg total) by mouth every 8 (eight) hours as needed. 01/12/24   Signa Delon LABOR, NP  oseltamivir (TAMIFLU) 75 MG capsule Take 1 capsule (75 mg total) by mouth every 12 (twelve) hours. 02/21/24  Yes Stuart Vernell Norris, PA-C  doxycycline  (VIBRAMYCIN ) 100 MG capsule Take 1  capsule (100 mg total) by mouth 2 (two) times daily. 01/10/24   Bluford Jacqulyn MATSU, DO    Family History Family History  Problem Relation Age of Onset   Diabetes Mother    Crohn's disease Mother    Other Father        problems with intestines   Other Son        absent right testicle   Hypertension Maternal Grandmother    Miscarriages / Stillbirths Maternal Grandmother    Hypertension Maternal Grandfather    Hyperlipidemia Maternal Grandfather    Crohn's disease Paternal Grandmother     Social History Social History[1]   Allergies   Amoxicillin, Ampicillin, and Penicillins   Review of Systems Review of Systems Per HPI  Physical Exam Triage Vital Signs ED Triage Vitals  Encounter Vitals Group     BP 02/21/24 1859 118/70     Girls Systolic BP Percentile --      Girls Diastolic BP Percentile --      Boys Systolic BP Percentile --      Boys Diastolic BP Percentile --      Pulse Rate 02/21/24 1859 (!) 124     Resp 02/21/24  1859 18     Temp 02/21/24 1859 99.8 F (37.7 C)     Temp Source 02/21/24 1859 Oral     SpO2 02/21/24 1859 94 %     Weight --      Height --      Head Circumference --      Peak Flow --      Pain Score 02/21/24 1845 0     Pain Loc --      Pain Education --      Exclude from Growth Chart --    No data found.  Updated Vital Signs BP 118/70 (BP Location: Right Arm)   Pulse (!) 124   Temp 99.8 F (37.7 C) (Oral)   Resp 18   LMP 02/21/2024   SpO2 94%   Breastfeeding No   Visual Acuity Right Eye Distance:   Left Eye Distance:   Bilateral Distance:    Right Eye Near:   Left Eye Near:    Bilateral Near:     Physical Exam Vitals and nursing note reviewed.  Constitutional:      Appearance: Normal appearance.  HENT:     Head: Atraumatic.     Right Ear: Tympanic membrane and external ear normal.     Left Ear: Tympanic membrane and external ear normal.     Nose: Rhinorrhea present.     Mouth/Throat:     Mouth: Mucous membranes are moist.      Pharynx: Posterior oropharyngeal erythema present.  Eyes:     Extraocular Movements: Extraocular movements intact.     Conjunctiva/sclera: Conjunctivae normal.  Cardiovascular:     Rate and Rhythm: Normal rate and regular rhythm.     Heart sounds: Normal heart sounds.  Pulmonary:     Effort: Pulmonary effort is normal.     Breath sounds: Normal breath sounds. No wheezing or rales.  Musculoskeletal:        General: Normal range of motion.     Cervical back: Normal range of motion and neck supple.  Skin:    General: Skin is warm and dry.  Neurological:     Mental Status: She is alert and oriented to person, place, and time.  Psychiatric:        Mood and Affect: Mood normal.        Thought Content: Thought content normal.    UC Treatments / Results  Labs (all labs ordered are listed, but only abnormal results are displayed) Labs Reviewed  POCT INFLUENZA A/B - Abnormal; Notable for the following components:      Result Value   Influenza B, POC Positive (*)    All other components within normal limits    EKG   Radiology No results found.  Procedures Procedures (including critical care time)  Medications Ordered in UC Medications - No data to display  Initial Impression / Assessment and Plan / UC Course  I have reviewed the triage vital signs and the nursing notes.  Pertinent labs & imaging results that were available during my care of the patient were reviewed by me and considered in my medical decision making (see chart for details).     Rapid flu positive for influenza B, tachycardic in triage likely secondary to fever.  Treat with Tamiflu, supportive over-the-counter medications and home care.  Return for worsening or unresolving symptoms.  Work note given.  Final Clinical Impressions(s) / UC Diagnoses   Final diagnoses:  Fever, unspecified  Influenza B   Discharge Instructions  None    ED Prescriptions     Medication Sig Dispense Auth. Provider    oseltamivir (TAMIFLU) 75 MG capsule Take 1 capsule (75 mg total) by mouth every 12 (twelve) hours. 10 capsule Stuart Vernell Norris, [place]      PDMP not reviewed this encounter.    [1]  Social History Tobacco Use   Smoking status: Former    Current packs/day: 0.25    Average packs/day: 0.3 packs/day for 0.5 years (0.1 ttl pk-yrs)    Types: Cigarettes   Smokeless tobacco: Never  Vaping Use   Vaping status: Never Used  Substance Use Topics   Alcohol use: Yes    Comment: occ   Drug use: Not Currently    Types: Marijuana     Stuart Vernell Norris, [place] 02/21/24 1938  "

## 2024-02-21 NOTE — ED Triage Notes (Signed)
 Pt states cough,body aches,chills since last night.  States she took Mucinex at home.

## 2024-02-29 ENCOUNTER — Other Ambulatory Visit (HOSPITAL_COMMUNITY)
Admission: RE | Admit: 2024-02-29 | Discharge: 2024-02-29 | Disposition: A | Source: Ambulatory Visit | Attending: Adult Health | Admitting: Adult Health

## 2024-02-29 ENCOUNTER — Encounter: Payer: Self-pay | Admitting: Adult Health

## 2024-02-29 ENCOUNTER — Ambulatory Visit: Admitting: Adult Health

## 2024-02-29 VITALS — BP 114/79 | HR 89 | Ht 71.0 in | Wt 239.2 lb

## 2024-02-29 DIAGNOSIS — Z3046 Encounter for surveillance of implantable subdermal contraceptive: Secondary | ICD-10-CM | POA: Diagnosis not present

## 2024-02-29 DIAGNOSIS — N921 Excessive and frequent menstruation with irregular cycle: Secondary | ICD-10-CM | POA: Insufficient documentation

## 2024-02-29 DIAGNOSIS — Z124 Encounter for screening for malignant neoplasm of cervix: Secondary | ICD-10-CM | POA: Diagnosis present

## 2024-02-29 DIAGNOSIS — R635 Abnormal weight gain: Secondary | ICD-10-CM | POA: Insufficient documentation

## 2024-02-29 NOTE — Addendum Note (Signed)
 Addended by: NEYSA CLARITA RAMAN on: 02/29/2024 10:02 AM   Modules accepted: Orders

## 2024-02-29 NOTE — Progress Notes (Signed)
" °  Subjective:     Patient ID: Christina Conley, female   DOB: 10-05-1995, 28 y.o.   MRN: [id number]  HPI Christina Conley is a 29 year old black female,single, [id number] in for nexplanon  removal due to bleeding with clots, that megace  did not help and weight gain. She also needs a pap.  PCP is Dr Tobie  Review of Systems For nexplanon  removal Bleeding with clots +weight gain Not currently having sex Reviewed past medical,surgical, social and family history. Reviewed medications and allergies.     Objective:   Physical Exam BP 114/79 (BP Location: Left Arm, Patient Position: Sitting, Cuff Size: Large)   Pulse 89   Ht 5' 11 (1.803 m)   Wt 239 lb 3.2 oz (108.5 kg)   LMP 02/21/2024   Breastfeeding No   BMI 33.36 kg/m     NEXPLANON  REMOVAL Consent signed and time out called. Left arm cleansed with betadine, and injected with 1.5 cc 2% lidocaine  and waited til numb.Under sterile technique a #11 blade was used to make small vertical incision, and a curved forceps was used to easily remove rod. Steri strips applied. Pressure dressing applied.   Skin warm and dry.Pelvic: external genitalia is normal in appearance no lesions, vagina: +blood and clots,urethra has no lesions or masses noted, cervix:smooth and bulbous,pap with GC/CHL and HR HPV genotyping performed, uterus: normal size, shape and contour, non tender, no masses felt, adnexa: no masses or tenderness noted. Bladder is non tender and no masses felt.    Upstream - 02/29/24 9096       Pregnancy Intention Screening   Does the patient want to become pregnant in the next year? No    Does the patient's partner want to become pregnant in the next year? No    Would the patient like to discuss contraceptive options today? No      Contraception Wrap Up   Current Method Abstinence;Hormonal Implant    End Method Abstinence;Female Condom    Contraception Counseling Provided Yes         Examination chaperoned by Clarita Salt LPN  Assessment:      1. Encounter for Nexplanon  removal (Primary) Nexplanon  removed Use condoms, if has sex, keep clean and dry x 24 hours, no heavy lifting, keep steri strips on x 72 hours, Keep pressure dressing on x 24 hours. Follow up prn problems.   2. Menorrhagia with irregular cycle Bleeding with clots  3. Weight gain  4. Routine Papanicolaou smear Pap sent Pap in 3 years if negative      Plan:     Follow up prn     "

## 2024-02-29 NOTE — Patient Instructions (Addendum)
Use condoms, keep clean and dry x 24 hours, no heavy lifting, keep steri strips on x 72 hours, Keep pressure dressing on x 24 hours. Follow up prn problems.  

## 2024-03-04 ENCOUNTER — Ambulatory Visit: Payer: Self-pay | Admitting: Adult Health

## 2024-03-04 LAB — CYTOLOGY - PAP
Chlamydia: NEGATIVE
Comment: NEGATIVE
Comment: NEGATIVE
Comment: NORMAL
Diagnosis: NEGATIVE
High risk HPV: NEGATIVE
Neisseria Gonorrhea: NEGATIVE

## 2024-04-02 ENCOUNTER — Encounter: Payer: Medicaid Other | Admitting: Internal Medicine
